# Patient Record
Sex: Female | Born: 1935 | Race: Black or African American | Hispanic: No | Smoking: Former smoker
Health system: Southern US, Community
[De-identification: ages and names within clinical notes are randomized; demographics above are authoritative.]

## PROBLEM LIST (undated history)

## (undated) DIAGNOSIS — C50919 Malignant neoplasm of unspecified site of unspecified female breast: Secondary | ICD-10-CM

## (undated) DIAGNOSIS — H409 Unspecified glaucoma: Secondary | ICD-10-CM

## (undated) DIAGNOSIS — E079 Disorder of thyroid, unspecified: Secondary | ICD-10-CM

## (undated) DIAGNOSIS — I10 Essential (primary) hypertension: Secondary | ICD-10-CM

## (undated) DIAGNOSIS — E785 Hyperlipidemia, unspecified: Secondary | ICD-10-CM

## (undated) DIAGNOSIS — C801 Malignant (primary) neoplasm, unspecified: Secondary | ICD-10-CM

## (undated) DIAGNOSIS — C3490 Malignant neoplasm of unspecified part of unspecified bronchus or lung: Secondary | ICD-10-CM

## (undated) DIAGNOSIS — D649 Anemia, unspecified: Secondary | ICD-10-CM

## (undated) DIAGNOSIS — E119 Type 2 diabetes mellitus without complications: Secondary | ICD-10-CM

---

## 1997-07-17 ENCOUNTER — Other Ambulatory Visit: Admission: RE | Admit: 1997-07-17 | Discharge: 1997-07-17 | Payer: Self-pay | Admitting: Family Medicine

## 1998-10-09 ENCOUNTER — Encounter: Payer: Self-pay | Admitting: Emergency Medicine

## 1998-10-09 ENCOUNTER — Emergency Department (HOSPITAL_COMMUNITY): Admission: EM | Admit: 1998-10-09 | Discharge: 1998-10-09 | Payer: Self-pay | Admitting: Emergency Medicine

## 1999-07-04 ENCOUNTER — Other Ambulatory Visit: Admission: RE | Admit: 1999-07-04 | Discharge: 1999-07-04 | Payer: Self-pay | Admitting: Family Medicine

## 1999-07-08 ENCOUNTER — Emergency Department (HOSPITAL_COMMUNITY): Admission: EM | Admit: 1999-07-08 | Discharge: 1999-07-08 | Payer: Self-pay | Admitting: Emergency Medicine

## 2002-03-20 ENCOUNTER — Encounter: Payer: Self-pay | Admitting: Family Medicine

## 2002-03-20 ENCOUNTER — Encounter: Admission: RE | Admit: 2002-03-20 | Discharge: 2002-03-20 | Payer: Self-pay | Admitting: Family Medicine

## 2003-06-04 ENCOUNTER — Encounter: Admission: RE | Admit: 2003-06-04 | Discharge: 2003-06-04 | Payer: Self-pay | Admitting: Family Medicine

## 2004-11-26 ENCOUNTER — Encounter: Admission: RE | Admit: 2004-11-26 | Discharge: 2004-11-26 | Payer: Self-pay | Admitting: Family Medicine

## 2004-12-09 ENCOUNTER — Encounter: Admission: RE | Admit: 2004-12-09 | Discharge: 2004-12-09 | Payer: Self-pay | Admitting: Family Medicine

## 2005-02-02 ENCOUNTER — Encounter: Admission: RE | Admit: 2005-02-02 | Discharge: 2005-02-02 | Payer: Self-pay | Admitting: Orthopedic Surgery

## 2005-07-09 ENCOUNTER — Other Ambulatory Visit: Admission: RE | Admit: 2005-07-09 | Discharge: 2005-07-09 | Payer: Self-pay | Admitting: Family Medicine

## 2005-07-09 ENCOUNTER — Encounter: Admission: RE | Admit: 2005-07-09 | Discharge: 2005-07-09 | Payer: Self-pay | Admitting: Family Medicine

## 2005-10-28 ENCOUNTER — Ambulatory Visit (HOSPITAL_COMMUNITY): Admission: RE | Admit: 2005-10-28 | Discharge: 2005-10-28 | Payer: Self-pay | Admitting: Family Medicine

## 2005-10-28 ENCOUNTER — Encounter: Payer: Self-pay | Admitting: Vascular Surgery

## 2005-11-02 ENCOUNTER — Encounter: Admission: RE | Admit: 2005-11-02 | Discharge: 2005-11-02 | Payer: Self-pay | Admitting: Orthopedic Surgery

## 2006-02-10 ENCOUNTER — Encounter: Admission: RE | Admit: 2006-02-10 | Discharge: 2006-02-10 | Payer: Self-pay | Admitting: Family Medicine

## 2006-02-19 ENCOUNTER — Encounter: Admission: RE | Admit: 2006-02-19 | Discharge: 2006-02-19 | Payer: Self-pay | Admitting: Family Medicine

## 2006-03-02 ENCOUNTER — Encounter: Admission: RE | Admit: 2006-03-02 | Discharge: 2006-03-02 | Payer: Self-pay | Admitting: Family Medicine

## 2006-03-22 ENCOUNTER — Ambulatory Visit: Payer: Self-pay | Admitting: Vascular Surgery

## 2006-04-28 ENCOUNTER — Encounter: Admission: RE | Admit: 2006-04-28 | Discharge: 2006-04-28 | Payer: Self-pay | Admitting: Endocrinology

## 2006-04-28 ENCOUNTER — Other Ambulatory Visit: Admission: RE | Admit: 2006-04-28 | Discharge: 2006-04-28 | Payer: Self-pay | Admitting: Diagnostic Radiology

## 2006-04-28 ENCOUNTER — Encounter (INDEPENDENT_AMBULATORY_CARE_PROVIDER_SITE_OTHER): Payer: Self-pay | Admitting: Specialist

## 2006-07-27 ENCOUNTER — Ambulatory Visit (HOSPITAL_COMMUNITY): Admission: RE | Admit: 2006-07-27 | Discharge: 2006-07-28 | Payer: Self-pay | Admitting: General Surgery

## 2006-07-27 ENCOUNTER — Encounter (HOSPITAL_BASED_OUTPATIENT_CLINIC_OR_DEPARTMENT_OTHER): Payer: Self-pay | Admitting: General Surgery

## 2006-10-01 ENCOUNTER — Ambulatory Visit: Payer: Self-pay | Admitting: Vascular Surgery

## 2007-04-18 ENCOUNTER — Ambulatory Visit: Payer: Self-pay | Admitting: Vascular Surgery

## 2007-10-21 ENCOUNTER — Ambulatory Visit: Payer: Self-pay | Admitting: Vascular Surgery

## 2007-11-04 ENCOUNTER — Ambulatory Visit: Payer: Self-pay | Admitting: Vascular Surgery

## 2007-12-01 ENCOUNTER — Inpatient Hospital Stay (HOSPITAL_COMMUNITY): Admission: RE | Admit: 2007-12-01 | Discharge: 2007-12-02 | Payer: Self-pay | Admitting: Vascular Surgery

## 2007-12-01 ENCOUNTER — Ambulatory Visit: Payer: Self-pay | Admitting: Vascular Surgery

## 2007-12-01 ENCOUNTER — Encounter: Payer: Self-pay | Admitting: Vascular Surgery

## 2007-12-15 ENCOUNTER — Ambulatory Visit: Payer: Self-pay | Admitting: Vascular Surgery

## 2007-12-30 ENCOUNTER — Ambulatory Visit: Payer: Self-pay | Admitting: Vascular Surgery

## 2008-03-08 ENCOUNTER — Other Ambulatory Visit: Admission: RE | Admit: 2008-03-08 | Discharge: 2008-03-08 | Payer: Self-pay | Admitting: Family Medicine

## 2008-07-27 ENCOUNTER — Ambulatory Visit: Payer: Self-pay | Admitting: Vascular Surgery

## 2008-10-09 ENCOUNTER — Encounter (INDEPENDENT_AMBULATORY_CARE_PROVIDER_SITE_OTHER): Payer: Self-pay | Admitting: General Surgery

## 2008-10-09 ENCOUNTER — Inpatient Hospital Stay (HOSPITAL_COMMUNITY): Admission: RE | Admit: 2008-10-09 | Discharge: 2008-10-18 | Payer: Self-pay | Admitting: General Surgery

## 2009-07-30 ENCOUNTER — Encounter: Admission: RE | Admit: 2009-07-30 | Discharge: 2009-07-30 | Payer: Self-pay | Admitting: General Surgery

## 2009-08-01 ENCOUNTER — Ambulatory Visit (HOSPITAL_BASED_OUTPATIENT_CLINIC_OR_DEPARTMENT_OTHER): Admission: RE | Admit: 2009-08-01 | Discharge: 2009-08-01 | Payer: Self-pay | Admitting: General Surgery

## 2009-08-16 ENCOUNTER — Ambulatory Visit (HOSPITAL_BASED_OUTPATIENT_CLINIC_OR_DEPARTMENT_OTHER): Admission: RE | Admit: 2009-08-16 | Discharge: 2009-08-16 | Payer: Self-pay | Admitting: General Surgery

## 2009-08-26 ENCOUNTER — Ambulatory Visit: Payer: Self-pay | Admitting: Oncology

## 2009-09-04 ENCOUNTER — Ambulatory Visit: Payer: Self-pay | Admitting: Oncology

## 2009-09-04 LAB — MANUAL DIFFERENTIAL (CHCC SATELLITE)
BASO: 1 % (ref 0–2)
Eos: 3 % (ref 0–7)
LYMPH: 30 % (ref 14–48)
MONO: 4 % (ref 0–13)
SEG: 61 % (ref 40–75)
Variant Lymph: 1 % — ABNORMAL HIGH (ref 0–0)

## 2009-09-04 LAB — CBC WITH DIFFERENTIAL (CANCER CENTER ONLY)
HCT: 39.9 % (ref 34.8–46.6)
HGB: 13.5 g/dL (ref 11.6–15.9)
MCH: 29.2 pg (ref 26.0–34.0)
MCV: 86 fL (ref 81–101)
Platelets: 266 10*3/uL (ref 145–400)
RBC: 4.61 10*6/uL (ref 3.70–5.32)
WBC: 8.2 10*3/uL (ref 3.9–10.0)

## 2009-09-04 LAB — CMP (CANCER CENTER ONLY)
AST: 16 U/L (ref 11–38)
Alkaline Phosphatase: 72 U/L (ref 26–84)
BUN, Bld: 11 mg/dL (ref 7–22)
Creat: 0.8 mg/dl (ref 0.6–1.2)
Glucose, Bld: 96 mg/dL (ref 73–118)
Total Bilirubin: 1.2 mg/dl (ref 0.20–1.60)

## 2009-09-06 ENCOUNTER — Inpatient Hospital Stay (HOSPITAL_COMMUNITY): Admission: EM | Admit: 2009-09-06 | Discharge: 2009-09-10 | Payer: Self-pay | Admitting: Emergency Medicine

## 2009-09-26 ENCOUNTER — Ambulatory Visit: Payer: Self-pay | Admitting: Radiation Oncology

## 2009-09-26 ENCOUNTER — Ambulatory Visit: Payer: Self-pay | Admitting: Oncology

## 2009-10-07 ENCOUNTER — Ambulatory Visit: Payer: Self-pay | Admitting: Radiation Oncology

## 2009-10-26 ENCOUNTER — Ambulatory Visit: Payer: Self-pay | Admitting: Radiation Oncology

## 2009-11-26 ENCOUNTER — Ambulatory Visit: Payer: Self-pay | Admitting: Radiation Oncology

## 2009-12-26 ENCOUNTER — Ambulatory Visit: Payer: Self-pay | Admitting: Radiation Oncology

## 2009-12-31 ENCOUNTER — Ambulatory Visit: Payer: Self-pay | Admitting: Vascular Surgery

## 2010-01-26 ENCOUNTER — Ambulatory Visit: Payer: Self-pay | Admitting: Radiation Oncology

## 2010-02-03 ENCOUNTER — Ambulatory Visit: Payer: Self-pay | Admitting: Oncology

## 2010-04-11 LAB — DIFFERENTIAL
Basophils Absolute: 0 10*3/uL (ref 0.0–0.1)
Eosinophils Absolute: 0 10*3/uL (ref 0.0–0.7)
Eosinophils Relative: 0 % (ref 0–5)
Lymphocytes Relative: 2 % — ABNORMAL LOW (ref 12–46)
Lymphs Abs: 0.3 10*3/uL — ABNORMAL LOW (ref 0.7–4.0)
Monocytes Absolute: 0.1 10*3/uL (ref 0.1–1.0)
Monocytes Absolute: 0.2 10*3/uL (ref 0.1–1.0)
Monocytes Relative: 1 % — ABNORMAL LOW (ref 3–12)
Neutro Abs: 10.7 10*3/uL — ABNORMAL HIGH (ref 1.7–7.7)
Neutrophils Relative %: 97 % — ABNORMAL HIGH (ref 43–77)
WBC Morphology: INCREASED

## 2010-04-11 LAB — GLUCOSE, CAPILLARY
Glucose-Capillary: 142 mg/dL — ABNORMAL HIGH (ref 70–99)
Glucose-Capillary: 148 mg/dL — ABNORMAL HIGH (ref 70–99)
Glucose-Capillary: 156 mg/dL — ABNORMAL HIGH (ref 70–99)
Glucose-Capillary: 158 mg/dL — ABNORMAL HIGH (ref 70–99)
Glucose-Capillary: 54 mg/dL — ABNORMAL LOW (ref 70–99)
Glucose-Capillary: 74 mg/dL (ref 70–99)
Glucose-Capillary: 75 mg/dL (ref 70–99)
Glucose-Capillary: 88 mg/dL (ref 70–99)

## 2010-04-11 LAB — BASIC METABOLIC PANEL
BUN: 19 mg/dL (ref 6–23)
BUN: 24 mg/dL — ABNORMAL HIGH (ref 6–23)
CO2: 25 mEq/L (ref 19–32)
Chloride: 104 mEq/L (ref 96–112)
Chloride: 106 mEq/L (ref 96–112)
GFR calc non Af Amer: >60 mL/min (ref >60)
Glucose, Bld: 112 mg/dL — ABNORMAL HIGH (ref 70–99)
Glucose, Bld: 199 mg/dL — ABNORMAL HIGH (ref 70–99)
Glucose, Bld: 92 mg/dL (ref 70–99)
Potassium: 3.3 mEq/L — ABNORMAL LOW (ref 3.5–5.1)
Potassium: 3.3 mEq/L — ABNORMAL LOW (ref 3.5–5.1)
Potassium: 4.5 mEq/L (ref 3.5–5.1)
Sodium: 136 mEq/L (ref 135–145)
Sodium: 137 mEq/L (ref 135–145)
Sodium: 138 mEq/L (ref 135–145)

## 2010-04-11 LAB — CBC
HCT: 31.5 % — ABNORMAL LOW (ref 36.0–46.0)
HCT: 34.4 % — ABNORMAL LOW (ref 36.0–46.0)
HCT: 38.1 % (ref 36.0–46.0)
HCT: 38.6 % (ref 36.0–46.0)
Hemoglobin: 10.5 g/dL — ABNORMAL LOW (ref 12.0–15.0)
Hemoglobin: 12.9 g/dL (ref 12.0–15.0)
Hemoglobin: 13.2 g/dL (ref 12.0–15.0)
MCH: 29.7 pg (ref 26.0–34.0)
MCH: 30 pg (ref 26.0–34.0)
MCHC: 33.3 g/dL (ref 30.0–36.0)
MCHC: 33.4 g/dL (ref 30.0–36.0)
MCV: 88.9 fL (ref 78.0–100.0)
MCV: 89.7 fL (ref 78.0–100.0)
MCV: 90 fL (ref 78.0–100.0)
RBC: 3.86 MIL/uL — ABNORMAL LOW (ref 3.87–5.11)
RBC: 4.34 MIL/uL (ref 3.87–5.11)
RDW: 15.8 % — ABNORMAL HIGH (ref 11.5–15.5)
RDW: 15.9 % — ABNORMAL HIGH (ref 11.5–15.5)
RDW: 15.9 % — ABNORMAL HIGH (ref 11.5–15.5)
WBC: 11.1 10*3/uL — ABNORMAL HIGH (ref 4.0–10.5)
WBC: 16.8 10*3/uL — ABNORMAL HIGH (ref 4.0–10.5)
WBC: 5.6 10*3/uL (ref 4.0–10.5)

## 2010-04-11 LAB — TSH: TSH: 2.851 u[IU]/mL (ref 0.350–4.500)

## 2010-04-11 LAB — URINALYSIS, ROUTINE W REFLEX MICROSCOPIC
Nitrite: NEGATIVE
Protein, ur: 100 mg/dL — AB
Protein, ur: 30 mg/dL — AB
Specific Gravity, Urine: 1.012 (ref 1.005–1.030)
Urobilinogen, UA: 0.2 mg/dL (ref 0.0–1.0)
Urobilinogen, UA: 1 mg/dL (ref 0.0–1.0)

## 2010-04-11 LAB — COMPREHENSIVE METABOLIC PANEL
ALT: 91 U/L — ABNORMAL HIGH (ref 0–35)
AST: 222 U/L — ABNORMAL HIGH (ref 0–37)
Alkaline Phosphatase: 211 U/L — ABNORMAL HIGH (ref 39–117)
Alkaline Phosphatase: 52 U/L (ref 39–117)
BUN: 24 mg/dL — ABNORMAL HIGH (ref 6–23)
CO2: 26 mEq/L (ref 19–32)
CO2: 27 mEq/L (ref 19–32)
Chloride: 100 mEq/L (ref 96–112)
GFR calc Af Amer: >60 mL/min (ref >60)
GFR calc non Af Amer: 60 mL/min — ABNORMAL LOW (ref >60)
Glucose, Bld: 125 mg/dL — ABNORMAL HIGH (ref 70–99)
Glucose, Bld: 157 mg/dL — ABNORMAL HIGH (ref 70–99)
Potassium: 3.4 mEq/L — ABNORMAL LOW (ref 3.5–5.1)
Potassium: 3.4 mEq/L — ABNORMAL LOW (ref 3.5–5.1)
Sodium: 140 mEq/L (ref 135–145)
Total Bilirubin: 0.9 mg/dL (ref 0.3–1.2)
Total Bilirubin: 2.6 mg/dL — ABNORMAL HIGH (ref 0.3–1.2)

## 2010-04-11 LAB — HEPATIC FUNCTION PANEL
ALT: 79 U/L — ABNORMAL HIGH (ref 0–35)
AST: 122 U/L — ABNORMAL HIGH (ref 0–37)
Alkaline Phosphatase: 202 U/L — ABNORMAL HIGH (ref 39–117)
Bilirubin, Direct: 0.2 mg/dL (ref 0.0–0.3)
Total Bilirubin: 0.9 mg/dL (ref 0.3–1.2)

## 2010-04-11 LAB — URINE MICROSCOPIC-ADD ON

## 2010-04-11 LAB — VITAMIN D 25 HYDROXY (VIT D DEFICIENCY, FRACTURES): Vit D, 25-Hydroxy: 40 ng/mL (ref 30–89)

## 2010-04-11 LAB — URINE CULTURE
Colony Count: NO GROWTH
Culture  Setup Time: 201108130123

## 2010-04-11 LAB — CULTURE, BLOOD (ROUTINE X 2)

## 2010-04-11 LAB — CK: Total CK: 20387 U/L — ABNORMAL HIGH (ref 7–177)

## 2010-04-12 LAB — GLUCOSE, CAPILLARY: Glucose-Capillary: 132 mg/dL — ABNORMAL HIGH (ref 70–99)

## 2010-04-12 LAB — POCT HEMOGLOBIN-HEMACUE: Hemoglobin: 14.7 g/dL (ref 12.0–15.0)

## 2010-04-13 LAB — BASIC METABOLIC PANEL
BUN: 12 mg/dL (ref 6–23)
Creatinine, Ser: 0.85 mg/dL (ref 0.4–1.2)
GFR calc Af Amer: >60 mL/min (ref >60)
GFR calc non Af Amer: >60 mL/min (ref >60)
Potassium: 3.8 mEq/L (ref 3.5–5.1)

## 2010-04-13 LAB — POCT HEMOGLOBIN-HEMACUE: Hemoglobin: 14 g/dL (ref 12.0–15.0)

## 2010-04-13 LAB — GLUCOSE, CAPILLARY: Glucose-Capillary: 137 mg/dL — ABNORMAL HIGH (ref 70–99)

## 2010-05-02 LAB — GLUCOSE, CAPILLARY
Glucose-Capillary: 100 mg/dL — ABNORMAL HIGH (ref 70–99)
Glucose-Capillary: 106 mg/dL — ABNORMAL HIGH (ref 70–99)
Glucose-Capillary: 109 mg/dL — ABNORMAL HIGH (ref 70–99)
Glucose-Capillary: 109 mg/dL — ABNORMAL HIGH (ref 70–99)
Glucose-Capillary: 113 mg/dL — ABNORMAL HIGH (ref 70–99)
Glucose-Capillary: 114 mg/dL — ABNORMAL HIGH (ref 70–99)
Glucose-Capillary: 118 mg/dL — ABNORMAL HIGH (ref 70–99)
Glucose-Capillary: 119 mg/dL — ABNORMAL HIGH (ref 70–99)
Glucose-Capillary: 121 mg/dL — ABNORMAL HIGH (ref 70–99)
Glucose-Capillary: 122 mg/dL — ABNORMAL HIGH (ref 70–99)
Glucose-Capillary: 123 mg/dL — ABNORMAL HIGH (ref 70–99)
Glucose-Capillary: 125 mg/dL — ABNORMAL HIGH (ref 70–99)
Glucose-Capillary: 127 mg/dL — ABNORMAL HIGH (ref 70–99)
Glucose-Capillary: 131 mg/dL — ABNORMAL HIGH (ref 70–99)
Glucose-Capillary: 148 mg/dL — ABNORMAL HIGH (ref 70–99)
Glucose-Capillary: 162 mg/dL — ABNORMAL HIGH (ref 70–99)
Glucose-Capillary: 97 mg/dL (ref 70–99)
Glucose-Capillary: 97 mg/dL (ref 70–99)
Glucose-Capillary: 123 mg/dL — ABNORMAL HIGH (ref 70–99)
Glucose-Capillary: 136 mg/dL — ABNORMAL HIGH (ref 70–99)
Glucose-Capillary: 141 mg/dL — ABNORMAL HIGH (ref 70–99)
Glucose-Capillary: 169 mg/dL — ABNORMAL HIGH (ref 70–99)
Glucose-Capillary: 171 mg/dL — ABNORMAL HIGH (ref 70–99)

## 2010-05-02 LAB — BASIC METABOLIC PANEL
BUN: 4 mg/dL — ABNORMAL LOW (ref 6–23)
BUN: 4 mg/dL — ABNORMAL LOW (ref 6–23)
BUN: 6 mg/dL (ref 6–23)
BUN: 9 mg/dL (ref 6–23)
CO2: 24 mEq/L (ref 19–32)
CO2: 29 mEq/L (ref 19–32)
CO2: 29 mEq/L (ref 19–32)
CO2: 30 mEq/L (ref 19–32)
CO2: 28 mEq/L (ref 19–32)
Calcium: 7.8 mg/dL — ABNORMAL LOW (ref 8.4–10.5)
Calcium: 9.5 mg/dL (ref 8.4–10.5)
Chloride: 104 mEq/L (ref 96–112)
Chloride: 108 mEq/L (ref 96–112)
Chloride: 107 mEq/L (ref 96–112)
Creatinine, Ser: 0.76 mg/dL (ref 0.4–1.2)
Creatinine, Ser: 0.9 mg/dL (ref 0.4–1.2)
GFR calc Af Amer: >60 mL/min (ref >60)
GFR calc Af Amer: >60 mL/min (ref >60)
GFR calc non Af Amer: >60 mL/min (ref >60)
Glucose, Bld: 101 mg/dL — ABNORMAL HIGH (ref 70–99)
Glucose, Bld: 126 mg/dL — ABNORMAL HIGH (ref 70–99)
Glucose, Bld: 144 mg/dL — ABNORMAL HIGH (ref 70–99)
Glucose, Bld: 168 mg/dL — ABNORMAL HIGH (ref 70–99)
Glucose, Bld: 237 mg/dL — ABNORMAL HIGH (ref 70–99)
Potassium: 3.6 mEq/L (ref 3.5–5.1)
Potassium: 3.6 mEq/L (ref 3.5–5.1)
Potassium: 3.7 mEq/L (ref 3.5–5.1)
Potassium: 3.8 mEq/L (ref 3.5–5.1)
Sodium: 140 mEq/L (ref 135–145)
Sodium: 140 mEq/L (ref 135–145)
Sodium: 141 mEq/L (ref 135–145)
Sodium: 138 mEq/L (ref 135–145)
Sodium: 139 mEq/L (ref 135–145)

## 2010-05-02 LAB — CARDIAC PANEL(CRET KIN+CKTOT+MB+TROPI)
CK, MB: 1.5 ng/mL (ref 0.3–4.0)
CK, MB: 1.8 ng/mL (ref 0.3–4.0)
CK, MB: 2.2 ng/mL (ref 0.3–4.0)
Relative Index: 0.9 (ref 0.0–2.5)
Relative Index: 1.2 (ref 0.0–2.5)
Relative Index: INVALID (ref 0.0–2.5)
Relative Index: INVALID (ref 0.0–2.5)
Total CK: 238 U/L — ABNORMAL HIGH (ref 7–177)
Troponin I: 0.06 ng/mL (ref 0.00–0.06)
Troponin I: 0.07 ng/mL — ABNORMAL HIGH (ref 0.00–0.06)
Troponin I: 0.13 ng/mL — ABNORMAL HIGH (ref 0.00–0.06)

## 2010-05-02 LAB — HEMOGLOBIN AND HEMATOCRIT, BLOOD
HCT: 27.1 % — ABNORMAL LOW (ref 36.0–46.0)
Hemoglobin: 9 g/dL — ABNORMAL LOW (ref 12.0–15.0)

## 2010-05-02 LAB — CROSSMATCH: ABO/RH(D): O POS

## 2010-05-02 LAB — CBC
HCT: 23 % — ABNORMAL LOW (ref 36.0–46.0)
HCT: 28.1 % — ABNORMAL LOW (ref 36.0–46.0)
HCT: 29 % — ABNORMAL LOW (ref 36.0–46.0)
HCT: 29.4 % — ABNORMAL LOW (ref 36.0–46.0)
Hemoglobin: 7.9 g/dL — CL (ref 12.0–15.0)
Hemoglobin: 9.6 g/dL — ABNORMAL LOW (ref 12.0–15.0)
Hemoglobin: 14.1 g/dL (ref 12.0–15.0)
Hemoglobin: 9.1 g/dL — ABNORMAL LOW (ref 12.0–15.0)
Hemoglobin: 9.6 g/dL — ABNORMAL LOW (ref 12.0–15.0)
MCHC: 33 g/dL (ref 30.0–36.0)
MCHC: 33.4 g/dL (ref 30.0–36.0)
MCHC: 34.3 g/dL (ref 30.0–36.0)
MCHC: 32.8 g/dL (ref 30.0–36.0)
MCHC: 33.1 g/dL (ref 30.0–36.0)
MCV: 92.2 fL (ref 78.0–100.0)
MCV: 93.2 fL (ref 78.0–100.0)
MCV: 93.8 fL (ref 78.0–100.0)
MCV: 93 fL (ref 78.0–100.0)
MCV: 93.4 fL (ref 78.0–100.0)
Platelets: 237 10*3/uL (ref 150–400)
Platelets: 200 10*3/uL (ref 150–400)
RBC: 3.02 MIL/uL — ABNORMAL LOW (ref 3.87–5.11)
RBC: 2.96 MIL/uL — ABNORMAL LOW (ref 3.87–5.11)
RDW: 15.2 % (ref 11.5–15.5)
RDW: 15.3 % (ref 11.5–15.5)
RDW: 14.9 % (ref 11.5–15.5)
RDW: 15 % (ref 11.5–15.5)
RDW: 15.2 % (ref 11.5–15.5)
WBC: 15 10*3/uL — ABNORMAL HIGH (ref 4.0–10.5)
WBC: 14.1 10*3/uL — ABNORMAL HIGH (ref 4.0–10.5)

## 2010-05-02 LAB — URINALYSIS, MICROSCOPIC ONLY
Glucose, UA: NEGATIVE mg/dL
Hgb urine dipstick: NEGATIVE
Specific Gravity, Urine: 1.023 (ref 1.005–1.030)
pH: 5.5 (ref 5.0–8.0)

## 2010-05-02 LAB — ABO/RH: ABO/RH(D): O POS

## 2010-05-02 LAB — URINE CULTURE: Special Requests: NEGATIVE

## 2010-05-02 LAB — POCT I-STAT 4, (NA,K, GLUC, HGB,HCT): Sodium: 140 mEq/L (ref 135–145)

## 2010-05-06 ENCOUNTER — Ambulatory Visit: Payer: Self-pay | Admitting: Internal Medicine

## 2010-05-07 LAB — CANCER ANTIGEN 27.29: CA 27.29: 18.1 U/mL (ref 0.0–38.6)

## 2010-05-27 ENCOUNTER — Ambulatory Visit: Payer: Self-pay | Admitting: Internal Medicine

## 2010-06-10 NOTE — Procedures (Signed)
CAROTID DUPLEX EXAM   INDICATION:  Followup known carotid artery disease.   HISTORY:  Diabetes:  Yes  Cardiac:  No  Hypertension:  Yes  Smoking:  Quit in 1993  Previous Surgery:  No  CV History:  No  Amaurosis Fugax No, Paresthesias No, Hemiparesis No                                       RIGHT             LEFT  Brachial systolic pressure:         150               150  Brachial Doppler waveforms:         Biphasic          Biphasic  Vertebral direction of flow:        Antegrade         Antegrade  DUPLEX VELOCITIES (cm/sec)  CCA peak systolic                   89                86  ECA peak systolic                   214               291  ICA peak systolic                   284               167  ICA end diastolic                   86                40  PLAQUE MORPHOLOGY:                  Calcified         Calcified  PLAQUE AMOUNT:                      Moderate          Moderate  PLAQUE LOCATION:                    ICA, ECA          ICA, ECA,   IMPRESSION:  A 60-79% stenosis noted in the right internal carotid  artery.  A 40-59% stenosis noted in the left internal carotid artery.  Antegrade bilateral vertebral arteries.   ___________________________________________  Larina Earthly, M.D.   MG/MEDQ  D:  10/01/2006  T:  10/01/2006  Job:  [id number]

## 2010-06-10 NOTE — Procedures (Signed)
CAROTID DUPLEX EXAM   INDICATION:  Followup known carotid artery disease and right carotid  endarterectomy.   HISTORY:  Diabetes:  Yes.  Cardiac:  No.  Hypertension:  Yes.  Smoking:  Quit in 1993.  Previous Surgery:  Right carotid endarterectomy.  CV History:  No.  Amaurosis Fugax No, Paresthesias No, Hemiparesis No                                       RIGHT             LEFT  Brachial systolic pressure:         130               118  Brachial Doppler waveforms:         Biphasic          Biphasic  Vertebral direction of flow:        Antegrade         Antegrade  DUPLEX VELOCITIES (cm/sec)  CCA peak systolic                   84                72  ECA peak systolic                   82                161  ICA peak systolic                   88                173  ICA end diastolic                   25                54  PLAQUE MORPHOLOGY:                  None              Calcified  PLAQUE AMOUNT:                      None              Moderate  PLAQUE LOCATION:                    None              ICA and ECA   IMPRESSION:  1. 40-59% stenosis noted in the left ICA.  2. Normal carotid duplex noted in the right ICA.  Status post right      carotid endarterectomy.  3. Antegrade bilateral vertebral arteries.   ___________________________________________  Larina Earthly, M.D.   MG/MEDQ  D:  07/27/2008  T:  07/27/2008  Job:  [id number]

## 2010-06-10 NOTE — Op Note (Signed)
NAME:  Madison Fry, Madison Fry NO.:  [id number]   MEDICAL RECORD NO.:  [id number]          PATIENT TYPE:  OIB   LOCATION:  1529                         FACILITY:  [facility]   PHYSICIAN:  Leonie Man, M.D.   DATE OF BIRTH:  [birth year]   DATE OF PROCEDURE:  07/27/2006  DATE OF DISCHARGE:                               OPERATIVE REPORT   PREOPERATIVE DIAGNOSIS:  Follicular nodules left thyroid lobe, rule out  carcinoma.   POSTOPERATIVE DIAGNOSIS:  Follicular nodules left thyroid lobe, rule out  carcinoma. Pathology pending.   PROCEDURE:  Left thyroid lobectomy.   SURGEON:  Dr. Leonie Man.   ASSISTANT:  Dr. Chevis Pretty.   ANESTHESIA:  General.   SPECIMENS TO LAB:  Left thyroid lobe.   ESTIMATED BLOOD LOSS:  Minimal.   COMPLICATIONS:  None.  The patient returned to the PACU in excellent  condition.   Madison Fry is a 77 year old lady who was discovered to have  thyroid nodules while getting carotid Dopplers. These were biopsied and  noted to be follicular lesions.  The patient come has come to the  operating room now for left-sided thyroid lobectomy to rule out the  possibility of a follicular carcinoma.  She understands the risks and  potential benefits of surgery and gives her consent to same.   DESCRIPTION OF PROCEDURE:  The patient was positioned supinely, the head  and neck hyperextending following the induction of satisfactory general  anesthesia.  The neck was prepped and draped to be included in a sterile  operative field.  Positive identification of the patient is made and  positive identification that the left lobe is to be removed.  A  transverse collar incision is made deep and through the skin and  subcutaneous tissue and through the platysma muscle. The superior flap  raised up to the thyroid cartilage and inferior flap down to the sternal  notch.  Strap muscles opened in the midline and dissection carried over  onto the left side, isolating  and mobilizing the left thyroid gland.  The gland then noted to have 2 nodules within it.  Dissection was taken  up to the upper pole where the upper pole itself was isolated, secured  with ties of 2-0 silk and a clip and the upper pole transected with the  harmonic scalpel.  Dissection was carried down to the lower pole and  starting at the lower pole dissection was carried up avoiding the  recurrent laryngeal nerve and the parathyroid glands.  Taking the  thyroid down serially maintaining hemostasis with clips and with  electrocautery. The thyroid was dissected free from the trachea and  carried across the midline of the trachea taking a portion of the  isthmus of the thyroid with it. The gland was then transected using the  harmonic scalpel, forwarded for pathologic evaluation.  Frozen section  was inconclusive with respect to cancer.  Permanent sections are  awaiting patholigical review.  Sponge, instrument and sharp counts were  verified, hemostasis assured within the left side of the neck. After a  thorough irrigation, patches of Surgicel were placed  within the neck for  additional hemostasis.  The midline strap muscles were then closed with  interrupted 3-0  Vicryl sutures.  Platysma muscle closed with interrupted 3-0 Vicryl  sutures.  Skin closed with running 5-0 Monocryl suture and reinforced  with Steri-Strips.  Sterile dressings applied.  The anesthetic reversed.  The patient removed from the operating room to the recovery room in  stable condition.  She tolerated the procedure well.      Leonie Man, M.D.  Electronically Signed     PB/MEDQ  D:  07/27/2006  T:  07/27/2006  Job:  [id number]   cc:   Dorisann Frames, M.D.  Fax: [phone]   Donia Guiles, M.D.  Fax: [phone]

## 2010-06-10 NOTE — Assessment & Plan Note (Signed)
OFFICE VISIT   Madison Fry, Madison Fry  DOB:  1928/07/27                                       10/01/2006  CHART#:[id number]   FOLLOWUP:  The patient comes in for continuing follow up of her  extracranial cerebrovascular occlusive disease.  She remains  asymptomatic.  She does have recent thyroid resection.  She reported she  had abnormal cells but does not recall being told this was cancerous.  She specifically denies any cardiac difficulty and specifically denies  any new neurologic deficits.   PHYSICAL EXAMINATION:  On physical exam, she is a well-developed, well-  nourished female appearing stated age of 58.  Blood pressure is 142/67,  pulse 69, respirations 18.  Her radial pulses are 2+ bilaterally.  She  is grossly intact neurologically.  I do not hear carotid bruits  bilaterally.   She underwent repeat carotid duplex in our office and this does show  progressing of her stenosis.  On her right carotid system, she is in the  60-79% range.  On the left, she is in the 40-59% range.   RECOMMENDATIONS:  I discussed this at length with the patient and  explained symptoms of carotid disease with her.  She will notify us  immediately if this occurs.  Otherwise we will see her again in 6 months  with serial carotid duplex to rule out progression to severe stenosis.   Larina Earthly, M.D.  Electronically Signed   TFE/MEDQ  D:  10/01/2006  T:  10/04/2006  Job:  [id number]   cc:   Donia Guiles, M.D.

## 2010-06-10 NOTE — Assessment & Plan Note (Signed)
OFFICE VISIT   LYNDSI, ALTIC  DOB:  [birth year]                                       11/04/2007  CHART#:[id number]   The patient presents today for evaluation of continued discussion of her  asymptomatic severe carotid stenosis.  I had seen her in routine  followup on 10/21/2007 and she had a marked progression of her stenosis.  She remains asymptomatic.  She has requested that we discuss this with  her daughter as well for better understanding.  I again explained the  asymptomatic nature of her severe right internal carotid artery  stenosis.  I explained that with her greater than 80% stenosis, that it  did put her at a statistically significant increase in right hemispheric  stroke related to her high-grade stenosis.  I again explained the  procedure to include endarterectomy with anticipated overnight stay in  the hospital and the usual recovery with some peri-incisional numbness.  I discussed the potential risks of surgery to include the 1 to 2% risk  of stroke with surgery.  The patient and her daughter understand and  wish to proceed with surgery.  The patient's daughter will be out of  town in late October and requests that this be done after this.  She is  scheduled for surgery at [facility] on 11/05 for a right  carotid endarterectomy for asymptomatic stenosis.   Larina Earthly, M.D.  Electronically Signed   TFE/MEDQ  D:  11/04/2007  T:  11/06/2007  Job:  [id number]

## 2010-06-10 NOTE — Op Note (Signed)
Madison Fry, Madison Fry NO.:  [id number]   MEDICAL RECORD NO.:  [id number]          PATIENT TYPE:  INP   LOCATION:  2899                         FACILITY:  [facility]   PHYSICIAN:  Larina Earthly, M.D.    DATE OF BIRTH:  [birth year]   DATE OF PROCEDURE:  12/01/2007  DATE OF DISCHARGE:                               OPERATIVE REPORT   PREOPERATIVE DIAGNOSIS:  Severe asymptomatic right internal carotid  artery stenosis.   POSTOPERATIVE DIAGNOSIS:  Severe asymptomatic right internal carotid  artery stenosis.   PROCEDURE:  Right carotid endarterectomy and Dacron patch angioplasty.   SURGEON:  Larina Earthly, MD   ASSISTANT:  Jerold Coombe, PA-C   ANESTHESIA:  General endotracheal.   COMPLICATIONS:  None.   DISPOSITION:  To recovery room neurologically intact.   PROCEDURE IN DETAIL:  The patient was taken to the operating room,  placed in supine position where the right neck prepped and draped in the  usual sterile fashion.  Incision was made at the anterior  sternocleidomastoid and carried down through the platysma with  electrocautery.  Sternocleidomastoid reflected posteriorly and the  carotid sheath was opened.  The patient had several small branches of  facial vein and these were ligated with 3-0 silk ties and divided.  The  common carotid artery was circled with an umbilical tape Rumel  tourniquet.  Dissection was taken down to the bifurcation.  The superior  thyroid artery was circled with 2-0 silk Potts tie, the external carotid  with a blue vessel loop, and the internal carotid was encircled with an  umbilical tape and Rumel tourniquet.  The plaque did extend further  distally on the internal carotid artery and for this reason this was  further mobilized.  The hypoglossal and vagus nerves were identified and  preserved.  The patient was given 9000 units of intravenous heparin and  after adequate circulation time, the internal and external common  carotid  arteries were occluded.  The common carotid artery was opened  with a 11 blade and extended longitudinally using Potts scissors through  the plaque onto the internal carotid artery with Potts scissors.  The  arteriotomy was extended above the level of the plaque on the internal  carotid artery.  A 10 short shunt was passed up the internal carotid  artery, allowed the back bleed, and then down the common carotid artery  where it was secured with Rumel tourniquets.  The endarterectomy was  begun on the common carotid artery and the plaque was divided proximally  with Potts scissors.  The endarterectomy was continued onto the  bifurcation.  The external carotid was endarterectomized by eversion  technique and the internal carotid artery was endarterectomized in an  open fashion.  Remaining atheromatous debris was removed from the  endarterectomy plane.  Finesse Hemashield Dacron patch was brought onto  the field and was sewn as patch angioplasty with a running 6-0 Prolene  suture.  Prior to completion of the anastomosis, shunt was removed and  usual flush maneuvers were undertaken.  The anastomosis was completed  and flow restored first  to the external and internal carotid artery.  Excellent flow characteristics were noted with hand-held Doppler in the  internal and external  carotid arteries.  The patient was given 50 mg of protamine to reverse  the heparin.  Wounds were irrigated with saline.  Hemostasis obtained  with electrocautery.  Wounds were closed with 3-0 Vicryl  in the  subcutaneous and subcuticular tissues.  Benzoin and Steri-Strips were  applied.      Larina Earthly, M.D.  Electronically Signed     TFE/MEDQ  D:  12/01/2007  T:  12/01/2007  Job:  [id number]

## 2010-06-10 NOTE — Procedures (Signed)
CAROTID DUPLEX EXAM   INDICATION:  Followup known carotid artery disease.   HISTORY:  Diabetes:  Yes.  Cardiac:  No.  Hypertension:  Yes.  Smoking:  Quit in 1993.  Previous Surgery:  No.  CV History:  Amaurosis Fugax No, Paresthesias No, Hemiparesis No                                       RIGHT             LEFT  Brachial systolic pressure:         130               130  Brachial Doppler waveforms:         Biphasic          Biphasic  Vertebral direction of flow:        Antegrade         Antegrade  DUPLEX VELOCITIES (cm/sec)  CCA peak systolic                   104               107  ECA peak systolic                   255               221  ICA peak systolic                   413               195  ICA end diastolic                   124               48  PLAQUE MORPHOLOGY:                  Calcified         Calcified  PLAQUE AMOUNT:                      Severe            Moderate  PLAQUE LOCATION:                    ICA and ECA       ICA and ECA   IMPRESSION:  1. 80-99% stenosis with heavy calcified plaque noted in the right ICA.  2. 40-59% stenosis noted in the left ICA.  Antegrade bilateral      vertebral arteries.      ___________________________________________  Larina Earthly, M.D.   MG/MEDQ  D:  10/21/2007  T:  10/22/2007  Job:  [id number]

## 2010-06-10 NOTE — Procedures (Signed)
CAROTID DUPLEX EXAM   INDICATION:  Follow up CEA.   HISTORY:  Diabetes:  Yes.  Cardiac:  No.  Hypertension:  Yes.  Smoking:  Quit in 1993.  Previous Surgery:  Right CEA 11/09 by Dr. Arbie Cookey.  CV History:  No.  Amaurosis Fugax No, Paresthesias No, Hemiparesis No.                                       RIGHT             LEFT  Brachial systolic pressure:         138               144  Brachial Doppler waveforms:         WNL               WNL  Vertebral direction of flow:        Antegrade         Antegrade  DUPLEX VELOCITIES (cm/sec)  CCA peak systolic                   98                89  ECA peak systolic                   101               206  ICA peak systolic                   75                178  ICA end diastolic                   18                37  PLAQUE MORPHOLOGY:                  N/A               Mixed  PLAQUE AMOUNT:                      Minimal           Moderate  PLAQUE LOCATION:                    N/A               CCA/ICA/ECA   IMPRESSION:  1. Widely patent right carotid endarterectomy.  2. 40% to 59% left internal carotid artery.  3. Left internal carotid artery stenosis.  4. Antegrade vertebral arteries bilaterally.  5. Stable findings compared to previous study of 07/2008.   ___________________________________________  Larina Earthly, M.D.   LT/MEDQ  D:  12/31/2009  T:  12/31/2009  Job:  [id number]

## 2010-06-10 NOTE — Discharge Summary (Signed)
Madison Fry, Madison Fry NO.:  [id number]   MEDICAL RECORD NO.:  [id number]          PATIENT TYPE:  INP   LOCATION:  3303                         FACILITY:  [facility]   PHYSICIAN:  Larina Earthly, M.D.    DATE OF BIRTH:  August 09, 1928   DATE OF ADMISSION:  12/01/2007  DATE OF DISCHARGE:  12/02/2007                               DISCHARGE SUMMARY   ADMISSION DIAGNOSIS:  Severe asymptomatic right internal carotid artery  stenosis.   FINAL DISCHARGE DIAGNOSES AND SECONDARY DIAGNOSES:  1. Severe asymptomatic right internal carotid artery stenosis status      post right carotid endarterectomy.  2. Diabetes mellitus type 2.  3. Hypertension.  4. Tobacco use, but quit in 1993.  5. Osteoarthritis.  6. Hyperlipidemia.  7. History of umbilical hernia repair in 1970.  8. History of appendectomy.  9. Thyroidectomy in 2008.  10.Occluded right fallopian tube.  11.Gastroesophageal reflux disease.  12.Mild postoperative hypokalemia, supplemented.  13.Mild postoperative hypotension.   PROCEDURES:  On December 01, 2007, right carotid endarterectomy with  Dacron patch angioplasty by Dr. Gretta Began.   BRIEF HISTORY:  Ms. Husband is a 77 year old female who has been  followed by Dr. Arbie Cookey for asymptomatic severe carotid stenosis.  She was  recently noted to have greater than 80% stenosis on the right.  Therefore, he recommended she undergo elective right carotid  endarterectomy to reduce her risk for future stroke.  She has 40-59%  stenosis on the left.  She has antegrade bilateral vertebral artery  flow.   HOSPITAL COURSE:  Ms. Stoney was electively admitted to [facility] on December 01, 2007.  She underwent the previously mentioned  procedure.  At the time of this dictation, she has had a relatively  uneventful postoperative course.  Of note, her urinalysis was mildly  positive, so a order for culture sensitivity has been ordered but is  still pending at this time.  She  has been afebrile, however, and has a  normal white count.  Postoperatively, she was extubated neurologically  intact.  She did show some evidence of mild right marginal mandibular  palsy, but denied dysphagia.  Neurologically, she has remained intact  and after recovery was transferred to Step-down Unit 3300 where in  anticipation will remain until discharge.  She had some mild  postoperative bradycardia in the low 50s, but has primarily been  maintaining normal sinus rhythm.  Her systolic blood pressure  postoperatively has been around 90 up to 130, it was around 105 on the  morning of postoperative day #1 and subsequently her antihypertensives  were held that morning only.  Her blood sugars have been stable on  glimepiride and NovoLog insulin sliding scale that was used during her  hospitalization.  At this time, she has voided and tolerated a regular  diet.  Her neck incision is healing well without signs of hematoma.  Her  pain has been controlled.  She will ambulate with the nursing staff  later this morning and if does well, we will plan to discharge home on  postoperative day #1.  Currently, vitals  were stable.  She is afebrile.  Her morning labs show a white count of 9.7, hemoglobin of 10.9,  hematocrit of 32.3, and platelet count of 172.  Sodium 134, potassium  3.3 which was supplemented, BUN of 10, creatinine 0.89, and blood  glucose of 134.  Currently, she remains in stable condition and felt  appropriate for discharge home if ambulating adequately.  She lives  alone, but family will be staying with her for the next several days.   DISCHARGE MEDICATIONS:  1. Oxycodone 5 mg 1-2 tablets p.o. q.4 h. p.r.n. pain or she may take      her Darvocet-N 100 as prescribed which she has at home.  However,      she was instructed not to take these within 6 hours of each other.  2. Atenolol HCT 50/25 mg q.p.m.  3. Benicar 40 mg at 4 p.m.  4. Glimepiride per her home dose at 1 p.m.  5.  Crestor 10 mg at 4 p.m.  6. Vitamin D supplement at 4 p.m.  7. Vitamin B12 supplement at 4 p.m.  8. Omega-3 supplements twice weekly.  9. Coated aspirin 325 mg daily.   DISCHARGE INSTRUCTIONS:  She is to continue her daily walking and  breathing exercises.  Avoid driving or heavy lifting for the next couple  of weeks.  She may shower and clean her incisions gently with soap and  water.  Call if she develops fever greater than 101, redness or drainage  from the incision site or neurologic changes.  Otherwise, she will see  Dr. Arbie Cookey in 2-3 weeks.  Our office will contact her regarding specific  appointment date and time.      Jerold Coombe, P.A.      Larina Earthly, M.D.  Electronically Signed    AWZ/MEDQ  D:  12/02/2007  T:  12/02/2007  Job:  [id number]   cc:   Leonie Man, M.D.  Donia Guiles, M.D.

## 2010-06-10 NOTE — Assessment & Plan Note (Signed)
OFFICE VISIT   Madison Fry, Madison Fry  DOB:  07-08-1928                                       10/21/2007  CHART#:[id number]   The patient presents today for followup of her asymptomatic carotid  disease.  I had seen her 1 year ago with moderate bilateral carotid  stenosis.  She has had no neurologic deficits, specifically no amaurosis  fugax, transient ischemic attack, or stroke.  She was seen today for  duplex followup.   PAST HISTORY:  Significant for non-insulin-dependent diabetes,  hypertension.  She does not have any history of cardiac disease.   SOCIAL HISTORY:  She does not smoke cigarettes, having quit in 1993.  Does not drink alcohol on a regular basis.   REVIEW OF SYSTEMS:  Positive for severe arthritis with pain in her legs  with walking.  She does have a history of dizziness in the past.  She is  on daily aspirin therapy.   PHYSICAL EXAM:  Well-developed black female appearing stated age of 30.  She is grossly intact neurologically.  Her radial pulses are 2+  bilaterally.  I cannot palpate pedal pulses.  Her carotid arteries are  without bruits bilaterally.  She has normal carotid pulses bilaterally.  She is grossly intact neurologically.  Heart is regular rate and rhythm.  Chest is clear bilaterally.   On carotid duplex today, she has had marked progression to severe  stenosis in her right internal carotid artery.  The location of the  bifurcation is normal and she does have normal internal carotid artery  above the severe stenosis.  I discussed this at length with the patient.  I explained that this increases her risk for stroke and I have  recommended that she proceed with right carotid endarterectomy for  reduction of her stroke risk.  I explained the procedure to include 1 to  2% risk of stroke with surgery and also low risk for cranial nerve  injury.  She understands.  She does wish to discuss this further with  her daughter for making  plans for surgery.  I explained that I would be  willing to see her back in the office with her daughter for further  discussion, or we could discuss this at the hospital at the time of her  surgery if she wishes to proceed.  She will notify us of her decision.   Larina Earthly, M.D.  Electronically Signed   TFE/MEDQ  D:  10/21/2007  T:  10/24/2007  Job:  [id number]   cc:   Donia Guiles, M.D.

## 2010-06-10 NOTE — Assessment & Plan Note (Signed)
OFFICE VISIT   MATISSE, ROSKELLEY  DOB:  14-Mar-1928                                       12/30/2007  CHART#:[id number]   The patient presents today for follow-up of her right carotid  endarterectomy and Dacron patch angioplasty on 12/01/2007.  She had a  severe asymptomatic right internal carotid artery stenosis.  She did  quite well in the hospital and was discharged to home on postoperative  day 1 with no postoperative difficulties.  She reports the usual amount  of peri-incisional numbness.  She reports that the soreness was present  for several days and then resolved completely.  She has had no  neurologic deficits.  She does report some fluctuation in her blood  pressure, reports that this has been running a little lower than usual  and she has actually held her antihypertensive medications on occasion.  She is meeting with Dr. Arvilla Market in February and will discuss this  further with him.   PHYSICAL EXAMINATION:  Her right neck incision is well-healed.  She has  no bruits bilaterally.  She is grossly intact neurologically.   I am quite pleased with her initial result.  I plan to see her again in  6 months with repeat carotid duplex at that time.  She will notify us  should she develop any wound problems or neurologic deficits.   Larina Earthly, M.D.  Electronically Signed   TFE/MEDQ  D:  12/30/2007  T:  01/02/2008  Job:  [id number]   cc:   Donia Guiles, M.D.

## 2010-06-10 NOTE — Procedures (Signed)
CAROTID DUPLEX EXAM   INDICATION:  Followup, carotid artery disease.   HISTORY:  Diabetes:  Yes.  Cardiac:  No.  Hypertension:  Yes.  Smoking:  Quit in 1993.  Previous Surgery:  No.  CV History:  Amaurosis Fugax No, Paresthesias No, Hemiparesis No                                       RIGHT             LEFT  Brachial systolic pressure:         134               140  Brachial Doppler waveforms:         Biphasic          Biphasic  Vertebral direction of flow:        Antegrade         Antegrade  DUPLEX VELOCITIES (cm/sec)  CCA peak systolic                   92                93  ECA peak systolic                   276               288  ICA peak systolic                   279               151  ICA end diastolic                   75                29  PLAQUE MORPHOLOGY:                  Calcified with shadowing            Calcified with shadowing  PLAQUE AMOUNT:                      Moderate to large Moderate to large  PLAQUE LOCATION:                    ICA, ECA          ICA, ECA   IMPRESSION:  1. Bilateral external carotid artery stenoses.  2. 60-79% right internal carotid artery stenosis.  3. 40-59% left internal carotid artery stenosis (lower end of range).  4. Calcified plaque with shadowing bilaterally could obscure a more      severe stenosis.   ___________________________________________  Larina Earthly, M.D.   DP/MEDQ  D:  04/18/2007  T:  04/18/2007  Job:  [id number]

## 2010-06-10 NOTE — Assessment & Plan Note (Signed)
OFFICE VISIT   Madison Fry, Madison Fry  DOB:  01-25-1929                                       07/27/2008  ZOXWR#:[id number]   The patient presents today for followup of her right carotid  endarterectomy for severe asymptomatic right internal carotid artery  stenosis on 12/01/2007.  She has done well since her surgery and has no  neurologic deficits.  She reports that she has had several colon polyps  and is going to require partial colon resection by Dr. Violeta Gelinas.  She is being seen for preoperative evaluation by this.  Her health is  otherwise unchanged.  Her right neck incision is well-healed.  She does  not have bruits bilaterally.  She is grossly intact neurologically.  She  has 2+ radial pulses bilaterally.  She underwent a carotid duplex today  in our office and this reveals wide patency of her endarterectomy.  She  does have no change in her moderate 40-59% stenosis in the left carotid.  She will continue her usual activity.  I explained that there is no  limitation or contraindication from the standpoint of her carotid  disease for her upcoming abdominal surgery as indicated.  We will  continue to follow her in our office per protocol.   Larina Earthly, M.D.  Electronically Signed   TFE/MEDQ  D:  07/27/2008  T:  07/31/2008  Job:  [id number]   cc:   Donia Guiles, M.D.  Gabrielle Dare Janee Morn, M.D.

## 2010-10-28 LAB — URINALYSIS, ROUTINE W REFLEX MICROSCOPIC
Bilirubin Urine: NEGATIVE
Bilirubin Urine: NEGATIVE
Glucose, UA: NEGATIVE
Hgb urine dipstick: NEGATIVE
Ketones, ur: NEGATIVE
Ketones, ur: NEGATIVE
Nitrite: NEGATIVE
Protein, ur: NEGATIVE
Specific Gravity, Urine: 1.023
Urobilinogen, UA: 1
pH: 5.5

## 2010-10-28 LAB — CBC
HCT: 32.3 — ABNORMAL LOW
HCT: 41.6
Hemoglobin: 10.9 — ABNORMAL LOW
MCHC: 33.1
MCHC: 33.8
MCV: 92.1
Platelets: 172
Platelets: 225
RDW: 14.2
RDW: 14.5

## 2010-10-28 LAB — COMPREHENSIVE METABOLIC PANEL
Albumin: 3.9
Alkaline Phosphatase: 76
BUN: 15
Chloride: 104
Creatinine, Ser: 0.87
Glucose, Bld: 103 — ABNORMAL HIGH
Potassium: 3.8
Total Bilirubin: 1.5 — ABNORMAL HIGH

## 2010-10-28 LAB — POCT I-STAT 7, (LYTES, BLD GAS, ICA,H+H)
Acid-Base Excess: 3 — ABNORMAL HIGH
Bicarbonate: 25.9 — ABNORMAL HIGH
Calcium, Ion: 1.15
O2 Saturation: 100
Patient temperature: 36.2
TCO2: 27
pCO2 arterial: 33.7 — ABNORMAL LOW
pO2, Arterial: 296 — ABNORMAL HIGH

## 2010-10-28 LAB — POCT I-STAT 4, (NA,K, GLUC, HGB,HCT)
HCT: 36
Sodium: 137

## 2010-10-28 LAB — GLUCOSE, CAPILLARY
Glucose-Capillary: 100 — ABNORMAL HIGH
Glucose-Capillary: 105 — ABNORMAL HIGH
Glucose-Capillary: 182 — ABNORMAL HIGH

## 2010-10-28 LAB — BASIC METABOLIC PANEL
BUN: 10
CO2: 28
Calcium: 8.4
Glucose, Bld: 134 — ABNORMAL HIGH
Sodium: 134 — ABNORMAL LOW

## 2010-10-28 LAB — TYPE AND SCREEN
ABO/RH(D): O POS
Antibody Screen: NEGATIVE

## 2010-10-28 LAB — PROTIME-INR
INR: 1
Prothrombin Time: 13

## 2010-10-28 LAB — APTT: aPTT: 30

## 2010-10-28 LAB — URINE MICROSCOPIC-ADD ON

## 2010-10-28 LAB — ABO/RH: ABO/RH(D): O POS

## 2010-11-05 ENCOUNTER — Ambulatory Visit: Payer: Self-pay | Admitting: Internal Medicine

## 2010-11-11 LAB — CALCIUM: Calcium: 8.6

## 2010-11-12 LAB — COMPREHENSIVE METABOLIC PANEL
ALT: 14
CO2: 26
Calcium: 9.4
GFR calc non Af Amer: >60
Glucose, Bld: 94
Sodium: 140

## 2010-11-12 LAB — URINALYSIS, ROUTINE W REFLEX MICROSCOPIC
Bilirubin Urine: NEGATIVE
Ketones, ur: NEGATIVE
Nitrite: NEGATIVE
Protein, ur: NEGATIVE
Urobilinogen, UA: 1

## 2010-11-12 LAB — CBC
Hemoglobin: 14
MCHC: 34.3
MCV: 88.6
RBC: 4.6

## 2010-11-12 LAB — DIFFERENTIAL
Basophils Absolute: 0
Basophils Relative: 0
Eosinophils Absolute: 0.2
Lymphs Abs: 2.3
Neutrophils Relative %: 69

## 2010-11-12 LAB — PROTIME-INR
INR: 0.9
Prothrombin Time: 12.7

## 2010-11-12 LAB — URINE MICROSCOPIC-ADD ON

## 2010-11-27 ENCOUNTER — Ambulatory Visit: Payer: Self-pay | Admitting: Internal Medicine

## 2011-05-07 ENCOUNTER — Ambulatory Visit: Payer: Self-pay | Admitting: Oncology

## 2011-05-07 LAB — COMPREHENSIVE METABOLIC PANEL
Albumin: 3.6 g/dL (ref 3.4–5.0)
Anion Gap: 10 (ref 7–16)
BUN: 16 mg/dL (ref 7–18)
Bilirubin,Total: 0.5 mg/dL (ref 0.2–1.0)
Calcium, Total: 9.3 mg/dL (ref 8.5–10.1)
Co2: 28 mmol/L (ref 21–32)
Creatinine: 1.03 mg/dL (ref 0.60–1.30)
Osmolality: 286 (ref 275–301)
Sodium: 141 mmol/L (ref 136–145)
Total Protein: 7.5 g/dL (ref 6.4–8.2)

## 2011-05-07 LAB — CBC CANCER CENTER
Basophil %: 0.4 %
HCT: 38.9 % (ref 35.0–47.0)
HGB: 13.2 g/dL (ref 12.0–16.0)
MCHC: 34 g/dL (ref 32.0–36.0)
Monocyte #: 0.5 x10 3/mm (ref 0.2–0.9)
Neutrophil #: 6 x10 3/mm (ref 1.4–6.5)
Neutrophil %: 75 %
Platelet: 255 x10 3/mm (ref 150–440)
RBC: 4.11 10*6/uL (ref 3.80–5.20)

## 2011-05-27 ENCOUNTER — Ambulatory Visit: Payer: Self-pay | Admitting: Oncology

## 2011-08-12 ENCOUNTER — Encounter: Payer: Self-pay | Admitting: Family Medicine

## 2011-08-27 ENCOUNTER — Encounter: Payer: Self-pay | Admitting: Family Medicine

## 2011-11-05 ENCOUNTER — Ambulatory Visit: Payer: Self-pay | Admitting: Oncology

## 2011-11-27 ENCOUNTER — Ambulatory Visit: Payer: Self-pay | Admitting: Oncology

## 2012-05-16 ENCOUNTER — Ambulatory Visit: Payer: Self-pay | Admitting: Oncology

## 2014-02-06 ENCOUNTER — Ambulatory Visit: Payer: Self-pay | Admitting: Oncology

## 2014-02-20 ENCOUNTER — Ambulatory Visit: Payer: Self-pay | Admitting: Oncology

## 2014-02-26 ENCOUNTER — Ambulatory Visit: Payer: Self-pay | Admitting: Oncology

## 2014-06-28 ENCOUNTER — Emergency Department
Admission: EM | Admit: 2014-06-28 | Discharge: 2014-06-28 | Disposition: A | Discharge status: Home / Self Care | Payer: Commercial Managed Care - HMO | Attending: Emergency Medicine | Admitting: Emergency Medicine

## 2014-06-28 ENCOUNTER — Emergency Department: Payer: Commercial Managed Care - HMO

## 2014-06-28 ENCOUNTER — Encounter: Payer: Self-pay | Admitting: Emergency Medicine

## 2014-06-28 DIAGNOSIS — M545 Low back pain: Secondary | ICD-10-CM | POA: Diagnosis present

## 2014-06-28 DIAGNOSIS — M5432 Sciatica, left side: Secondary | ICD-10-CM | POA: Diagnosis not present

## 2014-06-28 HISTORY — DX: Type 2 diabetes mellitus without complications: E11.9

## 2014-06-28 HISTORY — DX: Essential (primary) hypertension: I10

## 2014-06-28 LAB — URINALYSIS COMPLETE WITH MICROSCOPIC (ARMC ONLY)
Bacteria, UA: NONE SEEN
Bilirubin Urine: NEGATIVE
GLUCOSE, UA: NEGATIVE mg/dL
HGB URINE DIPSTICK: NEGATIVE
KETONES UR: NEGATIVE mg/dL
NITRITE: NEGATIVE
PH: 5 (ref 5.0–8.0)
PROTEIN: NEGATIVE mg/dL
Specific Gravity, Urine: 1.014 (ref 1.005–1.030)

## 2014-06-28 LAB — COMPREHENSIVE METABOLIC PANEL
ALK PHOS: 49 U/L (ref 38–126)
ALT: 9 U/L — ABNORMAL LOW (ref 14–54)
AST: 20 U/L (ref 15–41)
Albumin: 3.5 g/dL (ref 3.5–5.0)
Anion gap: 10 (ref 5–15)
BUN: 15 mg/dL (ref 6–20)
CO2: 27 mmol/L (ref 22–32)
Calcium: 9 mg/dL (ref 8.9–10.3)
Chloride: 100 mmol/L — ABNORMAL LOW (ref 101–111)
Creatinine, Ser: 0.86 mg/dL (ref 0.44–1.00)
Glucose, Bld: 172 mg/dL — ABNORMAL HIGH (ref 65–99)
POTASSIUM: 3.3 mmol/L — AB (ref 3.5–5.1)
Sodium: 137 mmol/L (ref 135–145)
Total Bilirubin: 0.6 mg/dL (ref 0.3–1.2)
Total Protein: 6.7 g/dL (ref 6.5–8.1)

## 2014-06-28 LAB — CBC WITH DIFFERENTIAL/PLATELET
Basophils Absolute: 0.1 10*3/uL (ref 0–0.1)
Basophils Relative: 1 %
Eosinophils Absolute: 0.1 10*3/uL (ref 0–0.7)
Eosinophils Relative: 1 %
HEMATOCRIT: 39.6 % (ref 35.0–47.0)
HEMOGLOBIN: 13.1 g/dL (ref 12.0–16.0)
Lymphocytes Relative: 16 %
Lymphs Abs: 1.3 10*3/uL (ref 1.0–3.6)
MCH: 31.5 pg (ref 26.0–34.0)
MCHC: 33.2 g/dL (ref 32.0–36.0)
MCV: 94.9 fL (ref 80.0–100.0)
Monocytes Absolute: 0.4 10*3/uL (ref 0.2–0.9)
Monocytes Relative: 5 %
NEUTROS PCT: 77 %
Neutro Abs: 6.4 10*3/uL (ref 1.4–6.5)
PLATELETS: 214 10*3/uL (ref 150–440)
RBC: 4.17 MIL/uL (ref 3.80–5.20)
RDW: 14.1 % (ref 11.5–14.5)
WBC: 8.3 10*3/uL (ref 3.6–11.0)

## 2014-06-28 MED ORDER — PREDNISONE 10 MG PO TABS: 10.0000 mg | ORAL_TABLET | Freq: Every day | ORAL | Status: DC

## 2014-06-28 MED ORDER — OXYCODONE-ACETAMINOPHEN 5-325 MG PO TABS
ORAL_TABLET | ORAL | Status: AC
  Administered 2014-06-28: 2 via ORAL
  Filled 2014-06-28: qty 2

## 2014-06-28 MED ORDER — OXYCODONE-ACETAMINOPHEN 5-325 MG PO TABS
2.0000 | ORAL_TABLET | Freq: Once | ORAL | Status: AC
Start: 2014-06-28 — End: 2014-06-28
  Administered 2014-06-28: 2 via ORAL

## 2014-06-28 MED ORDER — OXYCODONE-ACETAMINOPHEN 5-325 MG PO TABS: 1.0000 | ORAL_TABLET | Freq: Four times a day (QID) | ORAL | Status: DC | PRN

## 2014-06-28 MED ORDER — PREDNISONE 20 MG PO TABS
60.0000 mg | ORAL_TABLET | Freq: Once | ORAL | Status: AC
Start: 2014-06-28 — End: 2014-06-28
  Administered 2014-06-28: 60 mg via ORAL

## 2014-06-28 MED ORDER — PREDNISONE 20 MG PO TABS
ORAL_TABLET | ORAL | Status: AC
  Administered 2014-06-28: 60 mg via ORAL
  Filled 2014-06-28: qty 3

## 2014-06-28 NOTE — ED Provider Notes (Signed)
[facility] Emergency Department Provider Note     Time seen: ----------------------------------------- 8:32 AM on 06/28/2014 -----------------------------------------    I have reviewed the triage vital signs and the nursing notes.   HISTORY  Chief Complaint No chief complaint on file.    HPI Madison Fry is a 81 y.o. female with sharp left-sided low back pain for last 3 weeks. Moving around makes it worse, nothing really benefits the pain. It's intermittent, currently moderate. Patient denies fevers chills or other complaints, has recently been treated for a urinary tract infection.     No past medical history on file.  There are no active problems to display for this patient.   No past surgical history on file.  No current outpatient prescriptions on file.  Allergies Review of patient's allergies indicates not on file.  No family history on file.  Social History History  Substance Use Topics  . Smoking status: Not on file  . Smokeless tobacco: Not on file  . Alcohol Use: Not on file    Review of Systems Constitutional: Negative for fever. Eyes: Negative for visual changes. ENT: Negative for sore throat. Cardiovascular: Negative for chest pain. Respiratory: Negative for shortness of breath. Gastrointestinal: Negative for abdominal pain, vomiting and diarrhea. Genitourinary: Negative for dysuria. Musculoskeletal: Positive for left-sided low back pain Skin: Negative for rash. Neurological: Negative for headaches, focal weakness or numbness.  10-point ROS otherwise negative.  ____________________________________________   PHYSICAL EXAM:  VITAL SIGNS: ED Triage Vitals  Enc Vitals Group     BP --      Pulse --      Resp --      Temp --      Temp src --      SpO2 --      Weight --      Height --      Head Cir --      Peak Flow --      Pain Score --      Pain Loc --      Pain Edu? --      Excl. in [place]? --      Constitutional: Alert and oriented. Well appearing and in no distress. Eyes: Conjunctivae are normal. PERRL. Normal extraocular movements. ENT   Head: Normocephalic and atraumatic.   Nose: No congestion/rhinnorhea.   Mouth/Throat: Mucous membranes are moist.   Neck: No stridor. Hematological/Lymphatic/Immunilogical: No cervical lymphadenopathy. Cardiovascular: Normal rate, regular rhythm. Normal and symmetric distal pulses are present in all extremities. No murmurs, rubs, or gallops. Respiratory: Normal respiratory effort without tachypnea nor retractions. Breath sounds are clear and equal bilaterally. No wheezes/rales/rhonchi. Gastrointestinal: Soft and nontender. No distention. No abdominal bruits. There is no CVA tenderness. Musculoskeletal: Patient is tender over the left buttock, has a positive straight leg raise examination of the left, no CVA tenderness. Neurologic:  Normal speech and language. No gross focal neurologic deficits are appreciated. Speech is normal. No gait instability. Skin:  Skin is warm, dry and intact. No rash noted. Psychiatric: Mood and affect are normal. Speech and behavior are normal. Patient exhibits appropriate insight and judgment.  ____________________________________________    LABS (pertinent positives/negatives)  Labs Reviewed  COMPREHENSIVE METABOLIC PANEL - Abnormal; Notable for the following:    Potassium 3.3 (*)    Chloride 100 (*)    Glucose, Bld 172 (*)    ALT 9 (*)    All other components within normal limits  URINALYSIS COMPLETEWITH MICROSCOPIC ([facility] ONLY) - Abnormal; Notable  for the following:    Color, Urine YELLOW (*)    APPearance CLEAR (*)    Leukocytes, UA TRACE (*)    Squamous Epithelial / LPF 6-30 (*)    All other components within normal limits  CBC WITH DIFFERENTIAL/PLATELET   ____________________________________________  ED COURSE:  Pertinent labs & imaging results that were available during my care of  the patient were reviewed by me and considered in my medical decision making (see chart for details).  Likely sciatica, will check abdominal labs to ensure that missing intra-abdominal pathology. ____________________________________________   RADIOLOGY  CT of the pelvis without contrast  IMPRESSION: Negative for kidney stones or hydronephrosis.  Postsurgical changes consistent with a partial colectomy. There is a tubular fat-containing structure in the anterior abdomen which is likely related to postoperative change. No significant inflammation in this area. No evidence for bowel obstruction.  Stable left ovarian dermoid.  Degenerative changes in the lumbar spine and both hips. ____________________________________________    FINAL ASSESSMENT AND PLAN  Sciatica  Plan: Labs are unremarkable here, no intra-abdominal pathology. Findings consistent with sciatica. Patient given prednisone, we'll continue pain medicine and prednisone taper as an outpatient.    Earleen Newport, MD   Earleen Newport, MD 06/28/14 [phone]

## 2014-06-28 NOTE — Discharge Instructions (Signed)
Sciatica Sciatica is pain, weakness, numbness, or tingling along the path of the sciatic nerve. The nerve starts in the lower back and runs down the back of each leg. The nerve controls the muscles in the lower leg and in the back of the knee, while also providing sensation to the back of the thigh, lower leg, and the sole of your foot. Sciatica is a symptom of another medical condition. For instance, nerve damage or certain conditions, such as a herniated disk or bone spur on the spine, pinch or put pressure on the sciatic nerve. This causes the pain, weakness, or other sensations normally associated with sciatica. Generally, sciatica only affects one side of the body. CAUSES   Herniated or slipped disc.  Degenerative disk disease.  A pain disorder involving the narrow muscle in the buttocks (piriformis syndrome).  Pelvic injury or fracture.  Pregnancy.  Tumor (rare). SYMPTOMS  Symptoms can vary from mild to very severe. The symptoms usually travel from the low back to the buttocks and down the back of the leg. Symptoms can include:  Mild tingling or dull aches in the lower back, leg, or hip.  Numbness in the back of the calf or sole of the foot.  Burning sensations in the lower back, leg, or hip.  Sharp pains in the lower back, leg, or hip.  Leg weakness.  Severe back pain inhibiting movement. These symptoms may get worse with coughing, sneezing, laughing, or prolonged sitting or standing. Also, being overweight may worsen symptoms. DIAGNOSIS  Your caregiver will perform a physical exam to look for common symptoms of sciatica. He or she may ask you to do certain movements or activities that would trigger sciatic nerve pain. Other tests may be performed to find the cause of the sciatica. These may include:  Blood tests.  X-rays.  Imaging tests, such as an MRI or CT scan. TREATMENT  Treatment is directed at the cause of the sciatic pain. Sometimes, treatment is not necessary  and the pain and discomfort goes away on its own. If treatment is needed, your caregiver may suggest:  Over-the-counter medicines to relieve pain.  Prescription medicines, such as anti-inflammatory medicine, muscle relaxants, or narcotics.  Applying heat or ice to the painful area.  Steroid injections to lessen pain, irritation, and inflammation around the nerve.  Reducing activity during periods of pain.  Exercising and stretching to strengthen your abdomen and improve flexibility of your spine. Your caregiver may suggest losing weight if the extra weight makes the back pain worse.  Physical therapy.  Surgery to eliminate what is pressing or pinching the nerve, such as a bone spur or part of a herniated disk. HOME CARE INSTRUCTIONS   Only take over-the-counter or prescription medicines for pain or discomfort as directed by your caregiver.  Apply ice to the affected area for 20 minutes, 3-4 times a day for the first 48-72 hours. Then try heat in the same way.  Exercise, stretch, or perform your usual activities if these do not aggravate your pain.  Attend physical therapy sessions as directed by your caregiver.  Keep all follow-up appointments as directed by your caregiver.  Do not wear high heels or shoes that do not provide proper support.  Check your mattress to see if it is too soft. A firm mattress may lessen your pain and discomfort. SEEK IMMEDIATE MEDICAL CARE IF:   You lose control of your bowel or bladder (incontinence).  You have increasing weakness in the lower back, pelvis, buttocks,   or legs.  You have redness or swelling of your back.  You have a burning sensation when you urinate.  You have pain that gets worse when you lie down or awakens you at night.  Your pain is worse than you have experienced in the past.  Your pain is lasting longer than 4 weeks.  You are suddenly losing weight without reason. MAKE SURE YOU:  Understand these  instructions.  Will watch your condition.  Will get help right away if you are not doing well or get worse. Document Released: 01/06/2001 Document Revised: 07/14/2011 Document Reviewed: 05/24/2011 Exit[employer] Patient Information 2015 [employer]. This information is not intended to replace advice given to you by your health care provider. Make sure you discuss any questions you have with your health care provider.  

## 2014-06-28 NOTE — ED Notes (Signed)
MD at bedside. 

## 2014-06-28 NOTE — ED Notes (Signed)
Brought in via ems from home with a 3 week hx of left flank/back pain .Marland Kitchenstates pain is worse today and radiates into left leg..denies any urinary sxs'

## 2014-07-23 ENCOUNTER — Telehealth: Payer: Self-pay | Admitting: *Deleted

## 2014-07-23 DIAGNOSIS — C50919 Malignant neoplasm of unspecified site of unspecified female breast: Secondary | ICD-10-CM

## 2014-07-23 MED ORDER — TAMOXIFEN CITRATE 20 MG PO TABS: 20.0000 mg | ORAL_TABLET | Freq: Every day | ORAL | Status: DC

## 2014-07-23 NOTE — Telephone Encounter (Signed)
escribed

## 2014-08-17 ENCOUNTER — Emergency Department: Payer: Commercial Managed Care - HMO

## 2014-08-17 ENCOUNTER — Inpatient Hospital Stay
Admit: 2014-08-17 | Discharge: 2014-08-17 | Disposition: A | Discharge status: Home / Self Care | Payer: Commercial Managed Care - HMO | Attending: Internal Medicine | Admitting: Internal Medicine

## 2014-08-17 ENCOUNTER — Inpatient Hospital Stay
Admission: EM | Admit: 2014-08-17 | Discharge: 2014-08-20 | DRG: 690 | Disposition: A | Discharge status: Home / Self Care | Payer: Commercial Managed Care - HMO | Attending: Internal Medicine | Admitting: Internal Medicine

## 2014-08-17 ENCOUNTER — Encounter: Payer: Self-pay | Admitting: Medical Oncology

## 2014-08-17 DIAGNOSIS — W19XXXA Unspecified fall, initial encounter: Secondary | ICD-10-CM | POA: Diagnosis present

## 2014-08-17 DIAGNOSIS — R748 Abnormal levels of other serum enzymes: Secondary | ICD-10-CM | POA: Diagnosis present

## 2014-08-17 DIAGNOSIS — S32591A Other specified fracture of right pubis, initial encounter for closed fracture: Secondary | ICD-10-CM

## 2014-08-17 DIAGNOSIS — B9620 Unspecified Escherichia coli [E. coli] as the cause of diseases classified elsewhere: Secondary | ICD-10-CM | POA: Diagnosis present

## 2014-08-17 DIAGNOSIS — I959 Hypotension, unspecified: Secondary | ICD-10-CM | POA: Diagnosis present

## 2014-08-17 DIAGNOSIS — Z9889 Other specified postprocedural states: Secondary | ICD-10-CM

## 2014-08-17 DIAGNOSIS — S32592A Other specified fracture of left pubis, initial encounter for closed fracture: Secondary | ICD-10-CM | POA: Diagnosis present

## 2014-08-17 DIAGNOSIS — E890 Postprocedural hypothyroidism: Secondary | ICD-10-CM | POA: Diagnosis present

## 2014-08-17 DIAGNOSIS — Z9049 Acquired absence of other specified parts of digestive tract: Secondary | ICD-10-CM | POA: Diagnosis present

## 2014-08-17 DIAGNOSIS — M1990 Unspecified osteoarthritis, unspecified site: Secondary | ICD-10-CM | POA: Diagnosis present

## 2014-08-17 DIAGNOSIS — Z794 Long term (current) use of insulin: Secondary | ICD-10-CM

## 2014-08-17 DIAGNOSIS — N390 Urinary tract infection, site not specified: Secondary | ICD-10-CM | POA: Diagnosis not present

## 2014-08-17 DIAGNOSIS — M25559 Pain in unspecified hip: Secondary | ICD-10-CM | POA: Diagnosis present

## 2014-08-17 DIAGNOSIS — E860 Dehydration: Secondary | ICD-10-CM | POA: Diagnosis present

## 2014-08-17 DIAGNOSIS — R7989 Other specified abnormal findings of blood chemistry: Secondary | ICD-10-CM

## 2014-08-17 DIAGNOSIS — I10 Essential (primary) hypertension: Secondary | ICD-10-CM | POA: Diagnosis present

## 2014-08-17 DIAGNOSIS — R002 Palpitations: Secondary | ICD-10-CM | POA: Diagnosis present

## 2014-08-17 DIAGNOSIS — Z79899 Other long term (current) drug therapy: Secondary | ICD-10-CM

## 2014-08-17 DIAGNOSIS — R778 Other specified abnormalities of plasma proteins: Secondary | ICD-10-CM | POA: Diagnosis present

## 2014-08-17 DIAGNOSIS — Z853 Personal history of malignant neoplasm of breast: Secondary | ICD-10-CM

## 2014-08-17 DIAGNOSIS — E119 Type 2 diabetes mellitus without complications: Secondary | ICD-10-CM | POA: Diagnosis present

## 2014-08-17 HISTORY — DX: Malignant (primary) neoplasm, unspecified: C80.1

## 2014-08-17 LAB — CBC WITH DIFFERENTIAL/PLATELET
Basophils Absolute: 0.1 10*3/uL (ref 0–0.1)
Basophils Relative: 0 %
EOS ABS: 0.1 10*3/uL (ref 0–0.7)
EOS PCT: 0 %
HEMATOCRIT: 36.6 % (ref 35.0–47.0)
HEMOGLOBIN: 12.3 g/dL (ref 12.0–16.0)
LYMPHS PCT: 5 %
Lymphs Abs: 0.7 10*3/uL — ABNORMAL LOW (ref 1.0–3.6)
MCH: 31.3 pg (ref 26.0–34.0)
MCHC: 33.5 g/dL (ref 32.0–36.0)
MCV: 93.4 fL (ref 80.0–100.0)
MONO ABS: 0.4 10*3/uL (ref 0.2–0.9)
MONOS PCT: 3 %
NEUTROS ABS: 13.6 10*3/uL — AB (ref 1.4–6.5)
NEUTROS PCT: 92 %
PLATELETS: 164 10*3/uL (ref 150–440)
RBC: 3.92 MIL/uL (ref 3.80–5.20)
RDW: 14.3 % (ref 11.5–14.5)
WBC: 14.8 10*3/uL — AB (ref 3.6–11.0)

## 2014-08-17 LAB — URINALYSIS COMPLETE WITH MICROSCOPIC (ARMC ONLY)
Bilirubin Urine: NEGATIVE
Glucose, UA: NEGATIVE mg/dL
Hgb urine dipstick: NEGATIVE
Ketones, ur: NEGATIVE mg/dL
Nitrite: POSITIVE — AB
Protein, ur: NEGATIVE mg/dL
RBC / HPF: NONE SEEN RBC/hpf (ref 0–5)
Specific Gravity, Urine: 1.018 (ref 1.005–1.030)
pH: 5 (ref 5.0–8.0)

## 2014-08-17 LAB — BASIC METABOLIC PANEL
Anion gap: 11 (ref 5–15)
BUN: 19 mg/dL (ref 6–20)
CO2: 27 mmol/L (ref 22–32)
Calcium: 8.7 mg/dL — ABNORMAL LOW (ref 8.9–10.3)
Chloride: 99 mmol/L — ABNORMAL LOW (ref 101–111)
Creatinine, Ser: 0.89 mg/dL (ref 0.44–1.00)
GFR calc Af Amer: >60 mL/min (ref >60)
GFR calc non Af Amer: 58 mL/min — ABNORMAL LOW (ref >60)
Glucose, Bld: 170 mg/dL — ABNORMAL HIGH (ref 65–99)
Potassium: 3.2 mmol/L — ABNORMAL LOW (ref 3.5–5.1)
Sodium: 137 mmol/L (ref 135–145)

## 2014-08-17 LAB — GLUCOSE, CAPILLARY
GLUCOSE-CAPILLARY: 116 mg/dL — AB (ref 65–99)
GLUCOSE-CAPILLARY: 164 mg/dL — AB (ref 65–99)
Glucose-Capillary: 142 mg/dL — ABNORMAL HIGH (ref 65–99)

## 2014-08-17 LAB — TROPONIN I
Troponin I: 0.22 ng/mL — ABNORMAL HIGH (ref <0.031)
Troponin I: 0.17 ng/mL — ABNORMAL HIGH (ref <0.031)
Troponin I: 0.17 ng/mL — ABNORMAL HIGH (ref <0.031)

## 2014-08-17 MED ORDER — INSULIN ASPART 100 UNIT/ML ~~LOC~~ SOLN
0.0000 [IU] | Freq: Three times a day (TID) | SUBCUTANEOUS | Status: DC
  Administered 2014-08-18 – 2014-08-19 (×4): 2 [IU] via SUBCUTANEOUS
  Administered 2014-08-19: 1 [IU] via SUBCUTANEOUS
  Administered 2014-08-20: 2 [IU] via SUBCUTANEOUS
  Filled 2014-08-17 (×4): qty 2
  Filled 2014-08-17: qty 1
  Filled 2014-08-17: qty 2

## 2014-08-17 MED ORDER — POTASSIUM CHLORIDE 20 MEQ PO PACK
40.0000 meq | PACK | Freq: Once | ORAL | Status: AC
  Administered 2014-08-17: 40 meq via ORAL
  Filled 2014-08-17: qty 2

## 2014-08-17 MED ORDER — LEVOTHYROXINE SODIUM 50 MCG PO TABS
50.0000 ug | ORAL_TABLET | ORAL | Status: DC
  Administered 2014-08-18 – 2014-08-20 (×3): 50 ug via ORAL
  Filled 2014-08-17 (×4): qty 1

## 2014-08-17 MED ORDER — BISACODYL 5 MG PO TBEC: 5.0000 mg | DELAYED_RELEASE_TABLET | Freq: Every day | ORAL | Status: DC | PRN

## 2014-08-17 MED ORDER — DEXTROSE 5 % IV SOLN
1.0000 g | Freq: Once | INTRAVENOUS | Status: AC
  Administered 2014-08-17: 1 g via INTRAVENOUS
  Filled 2014-08-17: qty 10

## 2014-08-17 MED ORDER — DOCUSATE SODIUM 100 MG PO CAPS
100.0000 mg | ORAL_CAPSULE | Freq: Two times a day (BID) | ORAL | Status: DC
  Administered 2014-08-17 – 2014-08-20 (×6): 100 mg via ORAL
  Filled 2014-08-17 (×7): qty 1

## 2014-08-17 MED ORDER — TAMOXIFEN CITRATE 10 MG PO TABS
20.0000 mg | ORAL_TABLET | Freq: Every day | ORAL | Status: DC
  Administered 2014-08-18 – 2014-08-20 (×3): 20 mg via ORAL
  Filled 2014-08-17 (×3): qty 2

## 2014-08-17 MED ORDER — ASPIRIN EC 325 MG PO TBEC
325.0000 mg | DELAYED_RELEASE_TABLET | Freq: Every day | ORAL | Status: DC
  Administered 2014-08-18 – 2014-08-20 (×3): 325 mg via ORAL
  Filled 2014-08-17 (×3): qty 1

## 2014-08-17 MED ORDER — ENOXAPARIN SODIUM 30 MG/0.3ML ~~LOC~~ SOLN
30.0000 mg | SUBCUTANEOUS | Status: DC
  Administered 2014-08-17: 30 mg via SUBCUTANEOUS

## 2014-08-17 MED ORDER — CHLORTHALIDONE 25 MG PO TABS
50.0000 mg | ORAL_TABLET | Freq: Every day | ORAL | Status: DC
  Administered 2014-08-18 – 2014-08-20 (×2): 50 mg via ORAL
  Filled 2014-08-17 (×2): qty 2

## 2014-08-17 MED ORDER — CEFTRIAXONE SODIUM IN DEXTROSE 20 MG/ML IV SOLN: 1.0000 g | INTRAVENOUS | Status: DC

## 2014-08-17 MED ORDER — ASPIRIN 81 MG PO CHEW
324.0000 mg | CHEWABLE_TABLET | Freq: Once | ORAL | Status: AC
  Administered 2014-08-17: 324 mg via ORAL
  Filled 2014-08-17: qty 4

## 2014-08-17 MED ORDER — TRAMADOL HCL 50 MG PO TABS
100.0000 mg | ORAL_TABLET | Freq: Two times a day (BID) | ORAL | Status: DC
Start: 2014-08-17 — End: 2014-08-20
  Administered 2014-08-17 – 2014-08-20 (×6): 100 mg via ORAL
  Filled 2014-08-17 (×6): qty 2

## 2014-08-17 MED ORDER — LOSARTAN POTASSIUM 50 MG PO TABS
100.0000 mg | ORAL_TABLET | Freq: Every day | ORAL | Status: DC
  Administered 2014-08-18 – 2014-08-20 (×2): 100 mg via ORAL
  Filled 2014-08-17 (×2): qty 2

## 2014-08-17 MED ORDER — ONDANSETRON HCL 4 MG PO TABS: 4.0000 mg | ORAL_TABLET | Freq: Four times a day (QID) | ORAL | Status: DC | PRN

## 2014-08-17 MED ORDER — ACETAMINOPHEN 325 MG PO TABS: 650.0000 mg | ORAL_TABLET | Freq: Four times a day (QID) | ORAL | Status: DC | PRN

## 2014-08-17 MED ORDER — ONDANSETRON HCL 4 MG/2ML IJ SOLN: 4.0000 mg | Freq: Four times a day (QID) | INTRAMUSCULAR | Status: DC | PRN

## 2014-08-17 MED ORDER — DEXTROSE 5 % IV SOLN
1.0000 g | INTRAVENOUS | Status: DC
  Administered 2014-08-17 – 2014-08-20 (×4): 1 g via INTRAVENOUS
  Filled 2014-08-17 (×5): qty 10

## 2014-08-17 MED ORDER — MORPHINE SULFATE 2 MG/ML IJ SOLN: 2.0000 mg | INTRAMUSCULAR | Status: DC | PRN

## 2014-08-17 MED ORDER — EZETIMIBE 10 MG PO TABS
10.0000 mg | ORAL_TABLET | Freq: Every day | ORAL | Status: DC
  Administered 2014-08-18 – 2014-08-20 (×3): 10 mg via ORAL
  Filled 2014-08-17 (×3): qty 1

## 2014-08-17 MED ORDER — FAMOTIDINE 20 MG PO TABS
20.0000 mg | ORAL_TABLET | Freq: Every day | ORAL | Status: DC
  Administered 2014-08-17 – 2014-08-20 (×4): 20 mg via ORAL
  Filled 2014-08-17 (×4): qty 1

## 2014-08-17 MED ORDER — ALUM & MAG HYDROXIDE-SIMETH 200-200-20 MG/5ML PO SUSP: 30.0000 mL | Freq: Four times a day (QID) | ORAL | Status: DC | PRN

## 2014-08-17 MED ORDER — OXYCODONE-ACETAMINOPHEN 5-325 MG PO TABS: 1.0000 | ORAL_TABLET | Freq: Four times a day (QID) | ORAL | Status: DC | PRN

## 2014-08-17 MED ORDER — ATENOLOL-CHLORTHALIDONE 50-25 MG PO TABS: 2.0000 | ORAL_TABLET | Freq: Every day | ORAL | Status: DC

## 2014-08-17 MED ORDER — ACETAMINOPHEN 650 MG RE SUPP
650.0000 mg | Freq: Four times a day (QID) | RECTAL | Status: DC | PRN
Start: 2014-08-17 — End: 2014-08-20

## 2014-08-17 MED ORDER — POTASSIUM CHLORIDE IN NACL 20-0.9 MEQ/L-% IV SOLN
INTRAVENOUS | Status: AC
  Administered 2014-08-17: 14:00:00 via INTRAVENOUS
  Filled 2014-08-17: qty 1000

## 2014-08-17 MED ORDER — COLESEVELAM HCL 625 MG PO TABS
625.0000 mg | ORAL_TABLET | Freq: Two times a day (BID) | ORAL | Status: DC
  Administered 2014-08-17 – 2014-08-20 (×6): 625 mg via ORAL
  Filled 2014-08-17 (×8): qty 1

## 2014-08-17 MED ORDER — ATENOLOL 25 MG PO TABS
100.0000 mg | ORAL_TABLET | Freq: Every day | ORAL | Status: DC
  Administered 2014-08-18 – 2014-08-20 (×2): 100 mg via ORAL
  Filled 2014-08-17 (×2): qty 4

## 2014-08-17 NOTE — ED Notes (Signed)
CRITICAL VALUE ALERT  Critical value received:  Troponin  Date of notification:  08/17/14  Time of notification:  0938  Critical value read back: YES  Nurse who received alert:  Corey Caulfield,RN  MD notified (1st page): 0940  Time of first page:   MD notified (2nd page):  Time of second page:  Responding MD:Dr Archie Balboa  Time MD responded:

## 2014-08-17 NOTE — ED Provider Notes (Signed)
[facility] Emergency Department Provider Note   ____________________________________________  Time seen: On EMS arrival  I have reviewed the triage vital signs and the nursing notes.   HISTORY  Chief Complaint Fall and Hip Pain   History limited by: Not Limited   HPI Madison Fry is a 81 y.o. female who presents to the emergency department via EMS today because of left hip pain. The patient does state she had a fall yesterday and fell onto her left hip. She is walking outside to get to her motorized wheelchair when she fell. She is unsure why she fell. She states she might of felt a little dizzy at the time. Denies any chest pain at the time. During the fall the patient states she landed on her left hip. She has had pain since then. She denies any other injury. Denies hitting her head or loss of consciousness. She denies any fevers.   Past Medical History  Diagnosis Date  . Diabetes mellitus without complication   . Hypertension     There are no active problems to display for this patient.   No past surgical history on file.  Current Outpatient Rx  Name  Route  Sig  Dispense  Refill  . atenolol (TENORMIN) 50 MG tablet   Oral   Take 50 mg by mouth daily.         . colesevelam (WELCHOL) 625 MG tablet   Oral   Take by mouth 2 (two) times daily with a meal.         . ezetimibe (ZETIA) 10 MG tablet   Oral   Take 10 mg by mouth daily.         Marland Kitchen glipiZIDE (GLUCOTROL) 10 MG tablet   Oral   Take 10 mg by mouth daily before breakfast.         . metFORMIN (GLUMETZA) 500 MG (MOD) 24 hr tablet   Oral   Take 500 mg by mouth daily with breakfast.         . oxyCODONE-acetaminophen (ROXICET) 5-325 MG per tablet   Oral   Take 1 tablet by mouth every 6 (six) hours as needed.   20 tablet   0   . predniSONE (DELTASONE) 10 MG tablet   Oral   Take 1 tablet (10 mg total) by mouth daily. Take 6 tablets tomorrow, then decrease by one tablet  daily until gone.   21 tablet   0   . tamoxifen (NOLVADEX) 20 MG tablet   Oral   Take 1 tablet (20 mg total) by mouth daily.   90 tablet   0     Allergies Shellfish allergy  No family history on file.  Social History History  Substance Use Topics  . Smoking status: Never Smoker   . Smokeless tobacco: Not on file  . Alcohol Use: No    Review of Systems  Constitutional: Negative for fever. Cardiovascular: Negative for chest pain. Respiratory: Negative for shortness of breath. Gastrointestinal: Negative for abdominal pain, vomiting. Did have some diarrhea yesterday Genitourinary: Negative for dysuria. Musculoskeletal: Negative for back pain. Left hip pain Skin: Negative for rash. Neurological: Negative for headaches, focal weakness or numbness.   10-point ROS otherwise negative.  ____________________________________________   PHYSICAL EXAM:  VITAL SIGNS: ED Triage Vitals  Enc Vitals Group     BP --      Pulse --      Resp --      Temp --  Temp src --      SpO2 --      Weight --      Height --      Head Cir --      Peak Flow --      Pain Score --      Pain Loc --      Pain Edu? --      Excl. in [place]? --      Constitutional: Alert and oriented. Well appearing and in no distress. Eyes: Conjunctivae are normal. PERRL. Normal extraocular movements. ENT   Head: Normocephalic and atraumatic.   Nose: No congestion/rhinnorhea.   Mouth/Throat: Mucous membranes are moist.   Neck: No stridor. Hematological/Lymphatic/Immunilogical: No cervical lymphadenopathy. Cardiovascular: Normal rate, regular rhythm.  No murmurs, rubs, or gallops. Respiratory: Normal respiratory effort without tachypnea nor retractions. Breath sounds are clear and equal bilaterally. No wheezes/rales/rhonchi. Gastrointestinal: Soft and nontender. No distention. There is no CVA tenderness. Genitourinary: Deferred Musculoskeletal: Tenderness to palpation of the left hip. Pain  with external and internal rotation of the left leg. NV intact distally.  Neurologic:  Normal speech and language. No gross focal neurologic deficits are appreciated. Speech is normal.  Skin:  Skin is warm, dry and intact. No rash noted. Psychiatric: Mood and affect are normal. Speech and behavior are normal. Patient exhibits appropriate insight and judgment.  ____________________________________________    LABS (pertinent positives/negatives)  Labs Reviewed  CBC WITH DIFFERENTIAL/PLATELET - Abnormal; Notable for the following:    WBC 14.8 (*)    Neutro Abs 13.6 (*)    Lymphs Abs 0.7 (*)    All other components within normal limits  BASIC METABOLIC PANEL - Abnormal; Notable for the following:    Potassium 3.2 (*)    Chloride 99 (*)    Glucose, Bld 170 (*)    Calcium 8.7 (*)    GFR calc non Af Amer 58 (*)    All other components within normal limits  TROPONIN I - Abnormal; Notable for the following:    Troponin I 0.22 (*)    All other components within normal limits  URINALYSIS COMPLETEWITH MICROSCOPIC ([facility] ONLY) - Abnormal; Notable for the following:    Color, Urine YELLOW (*)    APPearance CLOUDY (*)    Nitrite POSITIVE (*)    Leukocytes, UA 2+ (*)    Bacteria, UA MANY (*)    Squamous Epithelial / LPF 6-30 (*)    All other components within normal limits     ____________________________________________   EKG  I, Nance Pear, attending physician, personally viewed and interpreted this EKG  EKG Time: 0850 Rate: 77 Rhythm: Normal sinus rhythm Axis: Normal Intervals: qtc 466 QRS: Narrow ST changes: No ST elevation ____________________________________________    RADIOLOGY  Left hip RADIOLOGY INTERPRETATION  I, Nance Pear, attending physician, personally viewed and interpreted these images: Location: Left hip Type of study: Plain film Number of views: 3 Pertinent positive/negative findings: No obvious femur fracture, fracture of pubic rami Clinical  Impression:Pubic rami fracture   Radiology Read IMPRESSION: 1. Slightly displaced fractures of the left superior and inferior pubic rami. 2. Severe lumbar spine and bilateral hip degenerative change. Diffuse osteopenia . 3. Aortoiliac atherosclerotic vascular disease.  ____________________________________________   PROCEDURES  Procedure(s) performed: None  Critical Care performed: No  ____________________________________________   INITIAL IMPRESSION / ASSESSMENT AND PLAN / ED COURSE  Pertinent labs & imaging results that were available during my care of the patient were reviewed by me and considered in  my medical decision making (see chart for details).  Patient presented to the emergency department today after a fall yesterday and subsequent left hip pain. X-rays show pubic rami fractures on the left. Unfortunately the patient's troponin was elevated and thus I'm concerned that patient perhaps fell yesterday after ACS. Will plan on admitting patient for further workup and evaluation.  ____________________________________________   FINAL CLINICAL IMPRESSION(S) / ED DIAGNOSES  Final diagnoses:  Elevated troponin  Fracture of multiple pubic rami, right, closed, initial encounter  UTI   Nance Pear, MD 08/17/14 1034

## 2014-08-17 NOTE — H&P (Signed)
[place] [facility] [place] NAME: Madison Fry    MR#:  [id number]  DATE OF BIRTH:  30-Sep-1928  DATE OF ADMISSION:  08/17/2014  PRIMARY CARE PHYSICIAN: Mayra Neer, MD   REQUESTING/REFERRING PHYSICIAN: Dr. Archie Balboa CHIEF COMPLAINT:  Dizziness, fall and hip pain HISTORY OF PRESENT ILLNESS:  Madison Fry  is a 81 y.o. female with a known history of recent episodes of intermittent palpitations, hypertension, diabetes mellitus and osteoarthritis was brought into the ED via EMS after she sustained a fall. Patient was reporting that she was feeling dizzy , almost passed out and fell on her left hip yesterday. Patient is not quite sure why she fell. Yesterday prior to fall she was feeling dizzy but denies any palpitations. Patient reports that she has had thyroid surgery in the past and takes thyroid supplements, lately she has been experiencing her heart racing. Denies any chest pain or shortness of breath. Denies any cardiac history are not seen by any cardiologist in the past. Left hip x-ray in the ED has revealed superior and inferior pubic rami fracture. Troponin is elevated at 0.22  PAST MEDICAL HISTORY:   Past Medical History  Diagnosis Date  . Diabetes mellitus without complication   . Hypertension    hypothyroidism following thyroid surgery Palpitations  PAST SURGICAL HISTOIRY:  Appendectomy Thyroid surgery Right carotid endarterectomy with stent placement Abdominal surgery for polypectomy  SOCIAL HISTORY:   History  Substance Use Topics  . Smoking status: Never Smoker   . Smokeless tobacco: Not on file  . Alcohol Use: No    FAMILY HISTORY:  No family history on file.  DRUG ALLERGIES:   Allergies  Allergen Reactions  . Shellfish Allergy Swelling    REVIEW OF SYSTEMS:  CONSTITUTIONAL: No fever, fatigue or weakness.  EYES: No blurred or double vision.  EARS, NOSE, AND THROAT: No tinnitus or ear pain.  RESPIRATORY:  No cough, shortness of breath, wheezing or hemoptysis.  CARDIOVASCULAR: Reporting dizziness and palpitations No chest pain, orthopnea, edema.  GASTROINTESTINAL: No nausea, vomiting, diarrhea or abdominal pain.  GENITOURINARY: No dysuria, hematuria.  ENDOCRINE: No dysuria, nocturia, reporting polyuria HEMATOLOGY: No anemia, easy bruising or bleeding SKIN: No rash or lesion. MUSCULOSKELETAL: No joint pain or arthritis.  Has history of osteoarthritis NEUROLOGIC: No tingling, numbness, weakness.  PSYCHIATRY: No anxiety or depression.   MEDICATIONS AT HOME:   Prior to Admission medications   Medication Sig Start Date End Date Taking? Authorizing Provider  atenolol-chlorthalidone (TENORETIC) 50-25 MG per tablet Take 2 tablets by mouth daily.   Yes Historical Provider, MD  colesevelam (WELCHOL) 625 MG tablet Take by mouth 2 (two) times daily with a meal.   Yes Historical Provider, MD  ezetimibe (ZETIA) 10 MG tablet Take 10 mg by mouth daily.   Yes Historical Provider, MD  glimepiride (AMARYL) 2 MG tablet Take 2 mg by mouth daily with breakfast.   Yes Historical Provider, MD  levothyroxine (SYNTHROID, LEVOTHROID) 50 MCG tablet Take 50 mcg by mouth daily.   Yes Historical Provider, MD  losartan (COZAAR) 100 MG tablet Take 100 mg by mouth daily.   Yes Historical Provider, MD  metFORMIN (GLUMETZA) 500 MG (MOD) 24 hr tablet Take 500 mg by mouth daily with breakfast.   Yes Historical Provider, MD  tamoxifen (NOLVADEX) 20 MG tablet Take 1 tablet (20 mg total) by mouth daily. 07/23/14  Yes Lloyd Huger, MD  traMADol (ULTRAM) 50 MG tablet Take 100 mg by mouth 2 (two)  times daily.   Yes Historical Provider, MD  oxyCODONE-acetaminophen (ROXICET) 5-325 MG per tablet Take 1 tablet by mouth every 6 (six) hours as needed. Patient not taking: Reported on 08/17/2014 06/28/14   Earleen Newport, MD  predniSONE (DELTASONE) 10 MG tablet Take 1 tablet (10 mg total) by mouth daily. Take 6 tablets tomorrow, then  decrease by one tablet daily until gone. Patient not taking: Reported on 08/17/2014 06/28/14   Earleen Newport, MD      VITAL SIGNS:  Blood pressure 99/81, pulse 75, temperature 98.3 F (36.8 C), temperature source Oral, resp. rate 21, height '5\' 4"'$  (1.626 m), weight 86.183 kg (190 lb), SpO2 95 %.  PHYSICAL EXAMINATION:  GENERAL:  81 y.o.-year-old patient lying in the bed with no acute distress.  EYES: Pupils equal, round, reactive to light and accommodation. No scleral icterus. Extraocular muscles intact.  HEENT: Head atraumatic, normocephalic. Oropharynx and nasopharynx clear.  NECK:  Supple, no jugular venous distention. No thyroid enlargement, no tenderness.  LUNGS: Normal breath sounds bilaterally, no wheezing, rales,rhonchi or crepitation. No use of accessory muscles of respiration.  CARDIOVASCULAR: S1, S2 normal. No murmurs, rubs, or gallops.  ABDOMEN: Soft, nontender, nondistended. Bowel sounds present. No organomegaly or mass.  EXTREMITIES: Left hip is tender and externally rotated. No pedal edema, cyanosis, or clubbing.  NEUROLOGIC: Cranial nerves II through XII are intact. Muscle strength 5/5 in all extremities except left hip joint. Sensation intact. Gait not checked.  PSYCHIATRIC: The patient is alert and oriented x 3.  SKIN: No obvious rash, lesion, or ulcer.   LABORATORY PANEL:   CBC  Recent Labs Lab 08/17/14 0903  WBC 14.8*  HGB 12.3  HCT 36.6  PLT 164   ------------------------------------------------------------------------------------------------------------------  Chemistries   Recent Labs Lab 08/17/14 0903  NA 137  K 3.2*  CL 99*  CO2 27  GLUCOSE 170*  BUN 19  CREATININE 0.89  CALCIUM 8.7*   ------------------------------------------------------------------------------------------------------------------  Cardiac Enzymes  Recent Labs Lab 08/17/14 0903  TROPONINI 0.22*    ------------------------------------------------------------------------------------------------------------------  RADIOLOGY:  Dg Chest Portable 1 View  08/17/2014   CLINICAL DATA:  Fall today  EXAM: PORTABLE CHEST - 1 VIEW  COMPARISON:  07/30/2009  FINDINGS: The heart is moderately enlarged. Lungs are clear. No pneumothorax. No rib fracture. No pleural effusion. Normal vascularity.  IMPRESSION: Cardiomegaly without decompensation.   Electronically Signed   By: Marybelle Killings M.D.   On: 08/17/2014 09:29   Dg Hip Unilat With Pelvis 2-3 Views Left  08/17/2014   CLINICAL DATA:  Hip pain.  Recent fall.  EXAM: DG HIP (WITH OR WITHOUT PELVIS) 2-3V LEFT  COMPARISON:  CT 07/16/2014  FINDINGS: Severe degenerative changes of the lumbar spine and both hips noted. Diffuse osteopenia. Fractures of the left superior inferior pubic rami are noted. These are slightly displaced. Aortoiliac atherosclerotic vascular disease. Pelvic calcifications consistent with phleboliths.  IMPRESSION: 1. Slightly displaced fractures of the left superior and inferior pubic rami. 2. Severe lumbar spine and bilateral hip degenerative change. Diffuse osteopenia . 3. Aortoiliac atherosclerotic vascular disease.   Electronically Signed   By: Marcello Moores  Register   On: 08/17/2014 09:24    EKG:  No orders found for this or any previous visit.  IMPRESSION AND PLAN:    Madison Fry  is a 81 y.o. female with a known history of recent episodes of intermittent palpitations, hypertension, diabetes mellitus and osteoarthritis was brought into the ED via EMS after she sustained a fall. Patient was reporting that she  was feeling dizzy , almost passed out and fell on her left hip yesterday.  1. Near syncope with intermittent episodes of palpitations: Will monitor her closely on telemetry Check orthostatic vitals Will obtain echocardiogram Cycle cardiac biomarkers Cardiology consult is placed Check TSH in a.m. If necessary will consider  carotid Dopplers as patient has history of right carotid endarterectomy. At this point as patient is reporting palpitations her dizzy spells can be from cardiac arrhythmias  2. Elevated troponin can be from cardiac arrhythmias as patient is reporting palpitations Monitor on telemetry Monitor Cardiac biomarker trend Cardiology is not recommending therapeutic anti-coagulation  at this time. Cardiology consult is placed to Dr. Saralyn Pilar, he is aware of the consult On aspirin and atenolol Check fasting lipid panel in a.m. Patient is on Zetia  3. Acute cystitis with leukocytosis and abnormal urinalysis Continue IV Rocephin which was started in the ED. Follow Urine culture results  4. Left hip pain secondary to left superior and inferior pubic ramus fracture Will provide pain management Will consider PT consult after ruling her out for ACS  5. Hypertension with soft blood pressure Continue atenolol and Cozaar with holding parameters  6. Diabetes mellitus Will provide sliding scale insulin Hold her home medications for diabetes  GI reflexes with Pepcid and DVT prophylaxis with heparin subcutaneous       All the records are reviewed and case discussed with ED provider. Management plans discussed with the patient, family and they are in agreement.  CODE STATUS: full code, daughter [place] OF THIS PATIENT, reviewing medical records including labs and imaging studies, performing history and physical, admission orders and coordination of care: 45 minutes.    Nicholes Mango M.D on 08/17/2014 at 11:16 AM  Between 7am to 6pm - Pager - [phone]  After 6pm go to [email or web address] - password [email or web address] [facility]  Office  [phone]  CC: Primary care physician; Mayra Neer, MD

## 2014-08-17 NOTE — ED Notes (Signed)
Pt reports falling yesterday around 1600. States that she is not exactly sure what made her fall but states that she has been feeling dizzy. Denies hitting head or LOC. C/o left hip pain.

## 2014-08-17 NOTE — Progress Notes (Signed)
*  PRELIMINARY RESULTS* Echocardiogram 2D Echocardiogram has been performed.  Madison Fry 08/17/2014, 2:25 PM

## 2014-08-17 NOTE — Consult Note (Signed)
[facility] Cardiology  CARDIOLOGY CONSULT NOTE  Patient ID: Madison Fry MRN: [id number] DOB/AGE: 1928/03/22 81 y.o.  Admit date: 08/17/2014 Referring Physician Gouru Primary Physician Mayra Neer Primary Cardiologist Reason for Consultation I fell  HPI: 81 year old female referred for evaluation of palpitations and borderline elevated troponin. She apparently was a use of health until day of admission when he presented to [facility] emergency room after falling. The patient apparently experienced dizziness but did not have loss of consciousness. The emergency room, x-ray revealed superior and inferior pubic rami fracture. The patient denies chest pain or shortness of breath. Troponin was elevated 0.22.  Review of systems complete and found to be negative unless listed above     Past Medical History  Diagnosis Date  . Diabetes mellitus without complication   . Hypertension   . Cancer     breast cancer, Right side lymph nodes removed    History reviewed. No pertinent past surgical history.  Prescriptions prior to admission  Medication Sig Dispense Refill Last Dose  . atenolol-chlorthalidone (TENORETIC) 50-25 MG per tablet Take 2 tablets by mouth daily.   08/17/2014 at am  . colesevelam [facility]) 625 MG tablet Take by mouth 2 (two) times daily with a meal.   08/17/2014 at am  . ezetimibe (ZETIA) 10 MG tablet Take 10 mg by mouth daily.   08/17/2014 at am  . glimepiride (AMARYL) 2 MG tablet Take 2 mg by mouth daily with breakfast.   08/17/2014 at am  . levothyroxine (SYNTHROID, LEVOTHROID) 50 MCG tablet Take 50 mcg by mouth daily.   08/17/2014 at am  . losartan (COZAAR) 100 MG tablet Take 100 mg by mouth daily.   08/17/2014 at am  . metFORMIN (GLUMETZA) 500 MG (MOD) 24 hr tablet Take 500 mg by mouth daily with breakfast.   08/17/2014 at am  . tamoxifen (NOLVADEX) 20 MG tablet Take 1 tablet (20 mg total) by mouth daily. 90 tablet 0 08/17/2014 at am  . traMADol (ULTRAM) 50 MG tablet Take 100 mg by  mouth 2 (two) times daily.   08/17/2014 at am  . oxyCODONE-acetaminophen (ROXICET) 5-325 MG per tablet Take 1 tablet by mouth every 6 (six) hours as needed. (Patient not taking: Reported on 08/17/2014) 20 tablet 0 Not Taking at Unknown time  . predniSONE (DELTASONE) 10 MG tablet Take 1 tablet (10 mg total) by mouth daily. Take 6 tablets tomorrow, then decrease by one tablet daily until gone. (Patient not taking: Reported on 08/17/2014) 21 tablet 0 Not Taking at Unknown time   History   Social History  . Marital Status: Widowed    Spouse Name: N/A  . Number of Children: N/A  . Years of Education: N/A   Occupational History  . Not on file.   Social History Main Topics  . Smoking status: Never Smoker   . Smokeless tobacco: Not on file  . Alcohol Use: No  . Drug Use: Not on file  . Sexual Activity: Not on file   Other Topics Concern  . Not on file   Social History Narrative    History reviewed. No pertinent family history.    Review of systems complete and found to be negative unless listed above      PHYSICAL EXAM  General: Well developed, well nourished, in no acute distress HEENT:  Normocephalic and atramatic Neck:  No JVD.  Lungs: Clear bilaterally to auscultation and percussion. Heart: HRRR . Normal S1 and S2 without gallops or murmurs.  Abdomen: Bowel sounds are  positive, abdomen soft and non-tender  Msk:  Back normal, normal gait. Normal strength and tone for age. Extremities: No clubbing, cyanosis or edema.   Neuro: Alert and oriented X 3. Psych:  Good affect, responds appropriately  Labs:   Lab Results  Component Value Date   WBC 14.8* 08/17/2014   HGB 12.3 08/17/2014   HCT 36.6 08/17/2014   MCV 93.4 08/17/2014   PLT 164 08/17/2014    Recent Labs Lab 08/17/14 0903  NA 137  K 3.2*  CL 99*  CO2 27  BUN 19  CREATININE 0.89  CALCIUM 8.7*  GLUCOSE 170*   Lab Results  Component Value Date   CKTOTAL 3333 RESULTS CONFIRMED BY MANUAL DILUTION*  09/10/2009   CKMB 1.4 10/14/2008   TROPONINI 0.22* 08/17/2014   No results found for: CHOL No results found for: HDL No results found for: LDLCALC No results found for: TRIG No results found for: CHOLHDL No results found for: LDLDIRECT    Radiology: Dg Chest Portable 1 View  08/17/2014   CLINICAL DATA:  Fall today  EXAM: PORTABLE CHEST - 1 VIEW  COMPARISON:  07/30/2009  FINDINGS: The heart is moderately enlarged. Lungs are clear. No pneumothorax. No rib fracture. No pleural effusion. Normal vascularity.  IMPRESSION: Cardiomegaly without decompensation.   Electronically Signed   By: Marybelle Killings M.D.   On: 08/17/2014 09:29   Dg Hip Unilat With Pelvis 2-3 Views Left  08/17/2014   CLINICAL DATA:  Hip pain.  Recent fall.  EXAM: DG HIP (WITH OR WITHOUT PELVIS) 2-3V LEFT  COMPARISON:  CT 07/16/2014  FINDINGS: Severe degenerative changes of the lumbar spine and both hips noted. Diffuse osteopenia. Fractures of the left superior inferior pubic rami are noted. These are slightly displaced. Aortoiliac atherosclerotic vascular disease. Pelvic calcifications consistent with phleboliths.  IMPRESSION: 1. Slightly displaced fractures of the left superior and inferior pubic rami. 2. Severe lumbar spine and bilateral hip degenerative change. Diffuse osteopenia . 3. Aortoiliac atherosclerotic vascular disease.   Electronically Signed   By: Marcello Moores  Register   On: 08/17/2014 09:24    EKG: Sinus rhythm  ASSESSMENT AND PLAN:   81 year old female who presents to [facility] emergency room after falling with resultant superior and inferior pubic ramus fracture, without loss of consciousness. She has palpitations that telemetry reveals predominant normal sinus rhythm. Patient has borderline elevated troponin in the absence of chest pain or ECG changes. His is likely due to an supply ischemia and not due to acute coronary syndrome.  Recommendations  1. Continue current medication 2. Defer full dose anticoagulation 3.  Continue to closely observe on telemetry to rule out arrhythmias 4. Review 2-D echocardiogram 5. No further cardiac diagnostics at this time   Signed: Anetra Czerwinski MD,PhD, [facility] 08/17/2014, 5:29 PM

## 2014-08-17 NOTE — Progress Notes (Signed)
Patient admitted to unit. Oriented to room, call bell, and staff. Bed in lowest position. Fall safety plan reviewed. Full assessment to Epic. Will continue to monitor. Skin verified with Izora Gala RN. No complaints at this time. Madison Fry

## 2014-08-18 LAB — GLUCOSE, CAPILLARY
GLUCOSE-CAPILLARY: 162 mg/dL — AB (ref 65–99)
Glucose-Capillary: 151 mg/dL — ABNORMAL HIGH (ref 65–99)
Glucose-Capillary: 160 mg/dL — ABNORMAL HIGH (ref 65–99)
Glucose-Capillary: 185 mg/dL — ABNORMAL HIGH (ref 65–99)

## 2014-08-18 LAB — LIPID PANEL
CHOLESTEROL: 138 mg/dL (ref 0–200)
HDL: 42 mg/dL (ref >40)
LDL Cholesterol: 69 mg/dL (ref 0–99)
Total CHOL/HDL Ratio: 3.3 RATIO
Triglycerides: 137 mg/dL (ref <150)
VLDL: 27 mg/dL (ref 0–40)

## 2014-08-18 LAB — COMPREHENSIVE METABOLIC PANEL
ALT: 10 U/L — AB (ref 14–54)
ANION GAP: 8 (ref 5–15)
AST: 15 U/L (ref 15–41)
Albumin: 3 g/dL — ABNORMAL LOW (ref 3.5–5.0)
Alkaline Phosphatase: 45 U/L (ref 38–126)
BILIRUBIN TOTAL: 1.4 mg/dL — AB (ref 0.3–1.2)
BUN: 17 mg/dL (ref 6–20)
CALCIUM: 7.9 mg/dL — AB (ref 8.9–10.3)
CO2: 24 mmol/L (ref 22–32)
Chloride: 101 mmol/L (ref 101–111)
Creatinine, Ser: 0.98 mg/dL (ref 0.44–1.00)
GFR calc non Af Amer: 52 mL/min — ABNORMAL LOW (ref >60)
GLUCOSE: 164 mg/dL — AB (ref 65–99)
Potassium: 3.5 mmol/L (ref 3.5–5.1)
Sodium: 133 mmol/L — ABNORMAL LOW (ref 135–145)
TOTAL PROTEIN: 5.7 g/dL — AB (ref 6.5–8.1)

## 2014-08-18 LAB — CBC
HEMATOCRIT: 33.7 % — AB (ref 35.0–47.0)
Hemoglobin: 11.2 g/dL — ABNORMAL LOW (ref 12.0–16.0)
MCH: 31.1 pg (ref 26.0–34.0)
MCHC: 33.4 g/dL (ref 32.0–36.0)
MCV: 93.1 fL (ref 80.0–100.0)
PLATELETS: 147 10*3/uL — AB (ref 150–440)
RBC: 3.62 MIL/uL — AB (ref 3.80–5.20)
RDW: 13.9 % (ref 11.5–14.5)
WBC: 14.6 10*3/uL — AB (ref 3.6–11.0)

## 2014-08-18 LAB — TSH: TSH: 2.529 u[IU]/mL (ref 0.350–4.500)

## 2014-08-18 LAB — PROTIME-INR
INR: 1.06
Prothrombin Time: 14 seconds (ref 11.4–15.0)

## 2014-08-18 LAB — TROPONIN I: Troponin I: 0.31 ng/mL — ABNORMAL HIGH (ref <0.031)

## 2014-08-18 MED ORDER — ENOXAPARIN SODIUM 40 MG/0.4ML ~~LOC~~ SOLN
40.0000 mg | SUBCUTANEOUS | Status: DC
  Administered 2014-08-18 – 2014-08-19 (×2): 40 mg via SUBCUTANEOUS
  Filled 2014-08-18 (×2): qty 0.4

## 2014-08-18 MED ORDER — METFORMIN HCL ER 500 MG PO TB24
500.0000 mg | ORAL_TABLET | Freq: Every day | ORAL | Status: DC
  Administered 2014-08-18 – 2014-08-20 (×3): 500 mg via ORAL
  Filled 2014-08-18 (×3): qty 1

## 2014-08-18 NOTE — Progress Notes (Signed)
[place] at [employer] NAME: Madison Fry    MR#:  [id number]  DATE OF BIRTH:  1928-08-28  SUBJECTIVE: 81 year old female admitted for near-syncope. Found to have UTI, superior and inferior pubic fracture. Patient denies chest pain, shortness of breath, nausea, vomiting. She feels better today.   CHIEF COMPLAINT:   Chief Complaint  Patient presents with  . Fall  . Hip Pain    REVIEW OF SYSTEMS:   ROS CONSTITUTIONAL: No fever, fatigue or weakness.  EYES: No blurred or double vision.  EARS, NOSE, AND THROAT: No tinnitus or ear pain.  RESPIRATORY: No cough, shortness of breath, wheezing or hemoptysis.  CARDIOVASCULAR: No chest pain, orthopnea, edema.  GASTROINTESTINAL: No nausea, vomiting, diarrhea or abdominal pain.  GENITOURINARY: No dysuria, hematuria.  ENDOCRINE: No polyuria, nocturia,  HEMATOLOGY: No anemia, easy bruising or bleeding SKIN: No rash or lesion. MUSCULOSKELETAL: Pelvic pain. Especially on the left side. NEUROLOGIC: No tingling, numbness, weakness.  PSYCHIATRY: No anxiety or depression.   DRUG ALLERGIES:   Allergies  Allergen Reactions  . Shellfish Allergy Swelling    VITALS:  Blood pressure 122/56, pulse 95, temperature 98.8 F (37.1 C), temperature source Oral, resp. rate 18, height '5\' 5"'$  (1.651 m), weight 80.241 kg (176 lb 14.4 oz), SpO2 96 %.  PHYSICAL EXAMINATION:  GENERAL:  81 y.o.-year-old patient lying in the bed with no acute distress.  EYES: Pupils equal, round, reactive to light and accommodation. No scleral icterus. Extraocular muscles intact.  HEENT: Head atraumatic, normocephalic. Oropharynx and nasopharynx clear.  NECK:  Supple, no jugular venous distention. No thyroid enlargement, no tenderness.  LUNGS: Normal breath sounds bilaterally, no wheezing, rales,rhonchi or crepitation. No use of accessory muscles of respiration.  CARDIOVASCULAR: S1, S2 normal. No murmurs, rubs, or gallops.   ABDOMEN: Soft, nontender, nondistended. Bowel sounds present. No organomegaly or mass.  EXTREMITIES: No pedal edema, cyanosis, or clubbing.  NEUROLOGIC: Cranial nerves II through XII are intact. Muscle strength 5/5 in all extremities. Sensation intact. Gait not checked.  PSYCHIATRIC: The patient is alert and oriented x 3.  SKIN: No obvious rash, lesion, or ulcer.    LABORATORY PANEL:   CBC  Recent Labs Lab 08/18/14 0022  WBC 14.6*  HGB 11.2*  HCT 33.7*  PLT 147*   ------------------------------------------------------------------------------------------------------------------  Chemistries   Recent Labs Lab 08/18/14 0022  NA 133*  K 3.5  CL 101  CO2 24  GLUCOSE 164*  BUN 17  CREATININE 0.98  CALCIUM 7.9*  AST 15  ALT 10*  ALKPHOS 45  BILITOT 1.4*   ------------------------------------------------------------------------------------------------------------------  Cardiac Enzymes  Recent Labs Lab 08/18/14 0022  TROPONINI 0.31*   ------------------------------------------------------------------------------------------------------------------  RADIOLOGY:  Dg Chest Portable 1 View  08/17/2014   CLINICAL DATA:  Fall today  EXAM: PORTABLE CHEST - 1 VIEW  COMPARISON:  07/30/2009  FINDINGS: The heart is moderately enlarged. Lungs are clear. No pneumothorax. No rib fracture. No pleural effusion. Normal vascularity.  IMPRESSION: Cardiomegaly without decompensation.   Electronically Signed   By: Marybelle Killings M.D.   On: 08/17/2014 09:29   Dg Hip Unilat With Pelvis 2-3 Views Left  08/17/2014   CLINICAL DATA:  Hip pain.  Recent fall.  EXAM: DG HIP (WITH OR WITHOUT PELVIS) 2-3V LEFT  COMPARISON:  CT 07/16/2014  FINDINGS: Severe degenerative changes of the lumbar spine and both hips noted. Diffuse osteopenia. Fractures of the left superior inferior pubic rami are noted. These are slightly displaced. Aortoiliac atherosclerotic vascular disease. Pelvic  calcifications consistent  with phleboliths.  IMPRESSION: 1. Slightly displaced fractures of the left superior and inferior pubic rami. 2. Severe lumbar spine and bilateral hip degenerative change. Diffuse osteopenia . 3. Aortoiliac atherosclerotic vascular disease.   Electronically Signed   By: Marcello Moores  Register   On: 08/17/2014 09:24    EKG:   Orders placed or performed during the hospital encounter of 08/17/14  . EKG 12-Lead  . EKG 12-Lead    ASSESSMENT AND PLAN:   #1: UTI: Patient feels better received IV fluids, continue anti-biotics for UTI, check urine cultures. Patient has no further arrhythmias. #2:  near-syncope elevated troponins likely secondary to UTI) rather than acute coronary syndrome. Echo showed EF more than 50%. Appreciate cardiology input. #3: Cystitis with UTI continue IV antibiotics.  #4; left hip pain secondary to left superior inferior ramus fractures ;check physical therapy evaluation.. 5. hypertension controlled with atenolol, Cozaar Diabetes mellitus type 2 continue insulin, her home medications.  All the records are reviewed and case discussed with Care Management/Social Workerr. Management plans discussed with the patient, family and they are in agreement.  CODE STATUS: full  TOTAL TIME TAKING CARE OF THIS PATIENT: 35  minutes.   POSSIBLE D/C IN 1-2DAYS, DEPENDING ON CLINICAL CONDITION.   Epifanio Lesches M.D on 08/18/2014 at 8:50 AM  Between 7am to 6pm - Pager - [phone]  After 6pm go to [email or web address] - password [email or web address] [facility]  Office  [phone]  CC: Primary care physician; Mayra Neer, MD

## 2014-08-18 NOTE — Progress Notes (Signed)
[facility] Cardiology  SUBJECTIVE: I don't have chest pain   Filed Vitals:   08/17/14 1202 08/17/14 1205 08/17/14 2002 08/18/14 0455  BP: 133/60 119/60 110/45 122/56  Pulse: 76 72 99 95  Temp: 98.3 F (36.8 C)  99.7 F (37.6 C) 98.8 F (37.1 C)  TempSrc: Oral  Oral Oral  Resp: '18  20 18  '$ Height: '5\' 5"'$  (1.651 m)     Weight: 80.241 kg (176 lb 14.4 oz)     SpO2: 96%  94% 96%     Intake/Output Summary (Last 24 hours) at 08/18/14 1105 Last data filed at 08/18/14 8242  Gross per 24 hour  Intake  897.5 ml  Output   1075 ml  Net -177.5 ml      PHYSICAL EXAM  General: Well developed, well nourished, in no acute distress HEENT:  Normocephalic and atramatic Neck:  No JVD.  Lungs: Clear bilaterally to auscultation and percussion. Heart: HRRR . Normal S1 and S2 without gallops or murmurs.  Abdomen: Bowel sounds are positive, abdomen soft and non-tender  Msk:  Back normal, normal gait. Normal strength and tone for age. Extremities: No clubbing, cyanosis or edema.   Neuro: Alert and oriented X 3. Psych:  Good affect, responds appropriately   LABS: Basic Metabolic Panel:  Recent Labs  08/17/14 0903 08/18/14 0022  NA 137 133*  K 3.2* 3.5  CL 99* 101  CO2 27 24  GLUCOSE 170* 164*  BUN 19 17  CREATININE 0.89 0.98  CALCIUM 8.7* 7.9*   Liver Function Tests:  Recent Labs  08/18/14 0022  AST 15  ALT 10*  ALKPHOS 45  BILITOT 1.4*  PROT 5.7*  ALBUMIN 3.0*   No results for input(s): LIPASE, AMYLASE in the last 72 hours. CBC:  Recent Labs  08/17/14 0903 08/18/14 0022  WBC 14.8* 14.6*  NEUTROABS 13.6*  --   HGB 12.3 11.2*  HCT 36.6 33.7*  MCV 93.4 93.1  PLT 164 147*   Cardiac Enzymes:  Recent Labs  08/17/14 1348 08/17/14 1830 08/18/14 0022  TROPONINI 0.17* 0.17* 0.31*   BNP: Invalid input(s): POCBNP D-Dimer: No results for input(s): DDIMER in the last 72 hours. Hemoglobin A1C: No results for input(s): HGBA1C in the last 72 hours. Fasting Lipid  Panel:  Recent Labs  08/18/14 0022  CHOL 138  HDL 42  LDLCALC 69  TRIG 137  CHOLHDL 3.3   Thyroid Function Tests:  Recent Labs  08/18/14 0022  TSH 2.529   Anemia Panel: No results for input(s): VITAMINB12, FOLATE, FERRITIN, TIBC, IRON, RETICCTPCT in the last 72 hours.  Dg Chest Portable 1 View  08/17/2014   CLINICAL DATA:  Fall today  EXAM: PORTABLE CHEST - 1 VIEW  COMPARISON:  07/30/2009  FINDINGS: The heart is moderately enlarged. Lungs are clear. No pneumothorax. No rib fracture. No pleural effusion. Normal vascularity.  IMPRESSION: Cardiomegaly without decompensation.   Electronically Signed   By: Marybelle Killings M.D.   On: 08/17/2014 09:29   Dg Hip Unilat With Pelvis 2-3 Views Left  08/17/2014   CLINICAL DATA:  Hip pain.  Recent fall.  EXAM: DG HIP (WITH OR WITHOUT PELVIS) 2-3V LEFT  COMPARISON:  CT 07/16/2014  FINDINGS: Severe degenerative changes of the lumbar spine and both hips noted. Diffuse osteopenia. Fractures of the left superior inferior pubic rami are noted. These are slightly displaced. Aortoiliac atherosclerotic vascular disease. Pelvic calcifications consistent with phleboliths.  IMPRESSION: 1. Slightly displaced fractures of the left superior and inferior pubic rami. 2.  Severe lumbar spine and bilateral hip degenerative change. Diffuse osteopenia . 3. Aortoiliac atherosclerotic vascular disease.   Electronically Signed   By: Marcello Moores  Register   On: 08/17/2014 09:24     Echo: normal left ventricular function, LVEF 55-65%  TELEMETRY: Normal sinus rhythm:  ASSESSMENT AND PLAN:  Active Problems:   Hip pain, acute   Elevated troponin    81 year old female presents with superior and inferior pubic ramus fracture, with borderline elevated troponin, in the absence of chest pain or ECG changes, likely due to demand supply ischemia, and not due to acute coronary syndrome.  Recommendations  1. Continue current medications 2. Defer full dose anticoagulation 3. Defer  further cardiac diagnostics at this time  Signed off for now, please call if any questions   Ryann Pauli, MD, PhD, [facility] 08/18/2014 11:05 AM

## 2014-08-18 NOTE — Progress Notes (Signed)
Anticoagulation monitoring  81 yo female currently ordered enoxaparin 30 mg SQ daily  Ht 65", Wt 80.2 kg Body mass index is 29.44 kg/(m^2). SCr 0.98, CrCl 45.5 ml/min  Based on BMI<40 and CrCl >30 ml/min, will adjust to enoxaparin 40 mg SQ daily per hospital policy.   Rayna Sexton, PharmD, BCPS Clinical Pharmacist  08/18/2014 12:36 PM

## 2014-08-18 NOTE — Progress Notes (Signed)
Per MD Vianne Bulls OK to order physical therapy

## 2014-08-19 DIAGNOSIS — N390 Urinary tract infection, site not specified: Secondary | ICD-10-CM | POA: Diagnosis present

## 2014-08-19 LAB — GLUCOSE, CAPILLARY
Glucose-Capillary: 147 mg/dL — ABNORMAL HIGH (ref 65–99)
Glucose-Capillary: 120 mg/dL — ABNORMAL HIGH (ref 65–99)
Glucose-Capillary: 148 mg/dL — ABNORMAL HIGH (ref 65–99)
Glucose-Capillary: 131 mg/dL — ABNORMAL HIGH (ref 65–99)

## 2014-08-19 LAB — CBC
HCT: 31.7 % — ABNORMAL LOW (ref 35.0–47.0)
Hemoglobin: 10.8 g/dL — ABNORMAL LOW (ref 12.0–16.0)
MCH: 32.2 pg (ref 26.0–34.0)
MCHC: 34 g/dL (ref 32.0–36.0)
MCV: 94.5 fL (ref 80.0–100.0)
PLATELETS: 129 10*3/uL — AB (ref 150–440)
RBC: 3.36 MIL/uL — ABNORMAL LOW (ref 3.80–5.20)
RDW: 14 % (ref 11.5–14.5)
WBC: 10.2 10*3/uL (ref 3.6–11.0)

## 2014-08-19 LAB — URINE CULTURE: Culture: >=100000

## 2014-08-19 MED ORDER — SODIUM CHLORIDE 0.9 % IV BOLUS (SEPSIS)
1000.0000 mL | Freq: Once | INTRAVENOUS | Status: AC
  Administered 2014-08-19: 1000 mL via INTRAVENOUS

## 2014-08-19 NOTE — Progress Notes (Signed)
Notified Dr. Reece Levy of troponin .31. No new orders, will pass on to dayshift.

## 2014-08-19 NOTE — Progress Notes (Signed)
[facility] [place] NAME: Othell Diluzio    MR#:  [id number]  DATE OF BIRTH:  10/08/1928  SUBJECTIVE: 81 year old female admitted for near-syncope. Found to have UTI, superior and inferior pubic fracture. Patient denies chest pain, shortness of breath, nausea, vomiting. Does have hypotension but asymptomatic.   CHIEF COMPLAINT:   Chief Complaint  Patient presents with  . Fall  . Hip Pain    REVIEW OF SYSTEMS:   ROS CONSTITUTIONAL: No fever, fatigue or weakness.  EYES: No blurred or double vision.  EARS, NOSE, AND THROAT: No tinnitus or ear pain.  RESPIRATORY: No cough, shortness of breath, wheezing or hemoptysis.  CARDIOVASCULAR: No chest pain, orthopnea, edema.  GASTROINTESTINAL: No nausea, vomiting, diarrhea or abdominal pain.  GENITOURINARY: No dysuria, hematuria.  ENDOCRINE: No polyuria, nocturia,  HEMATOLOGY: No anemia, easy bruising or bleeding SKIN: No rash or lesion. MUSCULOSKELETAL: Pelvic pain. Especially on the left side. NEUROLOGIC: No tingling, numbness, weakness.  PSYCHIATRY: No anxiety or depression.   DRUG ALLERGIES:   Allergies  Allergen Reactions  . Shellfish Allergy Swelling    VITALS:  Blood pressure 93/40, pulse 78, temperature 98.1 F (36.7 C), temperature source Oral, resp. rate 22, height '5\' 5"'$  (1.651 m), weight 80.241 kg (176 lb 14.4 oz), SpO2 90 %.  PHYSICAL EXAMINATION:  GENERAL:  81 y.o.-year-old patient lying in the bed with no acute distress.  EYES: Pupils equal, round, reactive to light and accommodation. No scleral icterus. Extraocular muscles intact.  HEENT: Head atraumatic, normocephalic. Oropharynx and nasopharynx clear.  NECK:  Supple, no jugular venous distention. No thyroid enlargement, no tenderness.  LUNGS: Normal breath sounds bilaterally, no wheezing, rales,rhonchi or crepitation. No use of accessory muscles of respiration.  CARDIOVASCULAR: S1, S2 normal. No murmurs, rubs, or  gallops.  ABDOMEN: Soft, nontender, nondistended. Bowel sounds present. No organomegaly or mass.  EXTREMITIES: No pedal edema, cyanosis, or clubbing.  NEUROLOGIC: Cranial nerves II through XII are intact. Muscle strength 5/5 in all extremities. Sensation intact. Gait not checked.  PSYCHIATRIC: The patient is alert and oriented x 3.  SKIN: No obvious rash, lesion, or ulcer.    LABORATORY PANEL:   CBC  Recent Labs Lab 08/19/14 0512  WBC 10.2  HGB 10.8*  HCT 31.7*  PLT 129*   ------------------------------------------------------------------------------------------------------------------  Chemistries   Recent Labs Lab 08/18/14 0022  NA 133*  K 3.5  CL 101  CO2 24  GLUCOSE 164*  BUN 17  CREATININE 0.98  CALCIUM 7.9*  AST 15  ALT 10*  ALKPHOS 45  BILITOT 1.4*   ------------------------------------------------------------------------------------------------------------------  Cardiac Enzymes  Recent Labs Lab 08/18/14 0022  TROPONINI 0.31*   ------------------------------------------------------------------------------------------------------------------  RADIOLOGY:  No results found.  EKG:   Orders placed or performed during the hospital encounter of 08/17/14  . EKG 12-Lead  . EKG 12-Lead    ASSESSMENT AND PLAN:   #1: UTI: Patient feels better received IV fluids, continue anti-biotics for UTI, ch urine cultures showed gram-negative rods final cultures are pending continue IV Rocephin until then.  #2:  near-syncope elevated troponins likely secondary to UTI) rather than acute coronary syndrome. Echo showed EF more than 50%. Appreciate cardiology input.   #4; left hip pain secondary to left superior inferior ramus fractures ;check physical therapy evaluation.. 5. hypertension ; hypotensive today but asymptomatic. Continue IV fluids for BP medications. Patient is asymptomatic so she can participate in physical therapy. Diabetes mellitus type 2 continue  insulin, her home medications.  All  the records are reviewed and case discussed with Care Management/Social Workerr. Management plans discussed with the patient, family and they are in agreement.  CODE STATUS: full  TOTAL TIME TAKING CARE OF THIS PATIENT: 35  minutes.   POSSIBLE D/C IN 1-2DAYS, DEPENDING ON CLINICAL CONDITION.   Epifanio Lesches M.D on 08/19/2014 at 10:07 AM  Between 7am to 6pm - Pager - [phone]  After 6pm go to [email or web address] - password [email or web address] [place] [facility]  Office  [phone]  CC: Primary care physician; Mayra Neer, MD

## 2014-08-19 NOTE — Evaluation (Signed)
Physical Therapy Evaluation Patient Details Name: Madison Fry MRN: [id number] DOB: 1928-12-05 Today's Date: 08/19/2014   History of Present Illness  81 yo who fell at home, had elevated troponin from UTI and admitted for near-syncope. Found to have superior and inferior pubic fracture.  Clinical Impression  Pt was noted to walk fairly well and has been needing assistance mainly for the lower surfaces.  Her plan is to go home with assistance from family with HHPT to strengthen her for lower surface standing.      Follow Up Recommendations Home health PT;Supervision/Assistance - 24 hour    Equipment Recommendations  None recommended by PT    Recommendations for Other Services       Precautions / Restrictions Precautions Precautions: Fall Restrictions Weight Bearing Restrictions: Yes LLE Weight Bearing: Weight bearing as tolerated      Mobility  Bed Mobility Overal bed mobility: Needs Assistance Bed Mobility: Supine to Sit     Supine to sit: Min assist     General bed mobility comments: mainly help under her trunk and minor help to finish scooting out to EOB  Transfers Overall transfer level: Needs assistance Equipment used: Rolling walker (2 wheeled);1 person hand held assist Transfers: Sit to/from [facility] Sit to Stand: Mod assist;Min assist (min higher and mod lower surfaces) Stand pivot transfers: Min assist;Min guard       General transfer comment: mainly an issue to stand up from std height commode  Ambulation/Gait Ambulation/Gait assistance: Min guard;Min assist Ambulation Distance (Feet): 40 Feet Assistive device: Rolling walker (2 wheeled);1 person hand held assist Gait Pattern/deviations: Step-to pattern;Decreased stride length;Decreased stance time - right;Wide base of support;Antalgic Gait velocity: slow Gait velocity interpretation: Below normal speed for age/gender General Gait Details: assist to support as pt walks in pain on  RLE with help to use walker more effectively and keep closer  Stairs            Wheelchair Mobility    Modified Rankin (Stroke Patients Only)       Balance                                             Pertinent Vitals/Pain Pain Assessment: Faces Faces Pain Scale: Hurts little more Pain Intervention(s): Limited activity within patient's tolerance;Monitored during session;Premedicated before session;Repositioned    Home Living Family/patient expects to be discharged to:: Private residence Living Arrangements: Alone Available Help at Discharge: Family Type of Home: House Home Access: Ramped entrance     Home Layout: One level Home Equipment: Environmental consultant - 2 wheels;Walker - 4 wheels;Shower seat;Bedside commode;Wheelchair - power;Other (comment) (uses power chair to leave house on ramp)      Prior Function Level of Independence: Independent with assistive device(s)               Hand Dominance        Extremity/Trunk Assessment   Upper Extremity Assessment: Overall WFL for tasks assessed           Lower Extremity Assessment: RLE deficits/detail RLE Deficits / Details: pain with mobility due to pelvic fracture    Cervical / Trunk Assessment: Kyphotic  Communication   Communication: No difficulties  Cognition Arousal/Alertness: Awake/alert Behavior During Therapy: WFL for tasks assessed/performed Overall Cognitive Status: Within Functional Limits for tasks assessed       Memory: Decreased short-term memory;Decreased recall of precautions  General Comments General comments (skin integrity, edema, etc.): Pt has a poor tolerance for pain in R LE but then can stand with minor help from higher surfaces when more distracted.  Has help from her family at home and equipment to assist, and with HHPT should be successful.     Exercises        Assessment/Plan    PT Assessment Patient needs continued PT services  PT  Diagnosis Acute pain;Altered mental status   PT Problem List Decreased strength;Decreased range of motion;Decreased activity tolerance;Decreased mobility;Decreased balance;Decreased coordination;Decreased cognition;Decreased knowledge of use of DME;Decreased safety awareness;Decreased knowledge of precautions;Cardiopulmonary status limiting activity;Pain  PT Treatment Interventions DME instruction;Gait training;Functional mobility training;Therapeutic activities;Therapeutic exercise;Balance training;Neuromuscular re-education;Patient/family education   PT Goals (Current goals can be found in the Care Plan section) Acute Rehab PT Goals Patient Stated Goal: to go home ASAP PT Goal Formulation: With patient Time For Goal Achievement: 09/02/14 Potential to Achieve Goals: Good    Frequency 7X/week   Barriers to discharge   Has family there 24/7 per pt    Co-evaluation               End of Session Equipment Utilized During Treatment: Gait belt Activity Tolerance: Patient tolerated treatment well;Patient limited by pain Patient left: in chair;with call bell/phone within reach;with chair alarm set;with nursing/sitter in room Nurse Communication: Mobility status         Time: 1341-1409 PT Time Calculation (min) (ACUTE ONLY): 28 min   Charges:   PT Evaluation $Initial PT Evaluation Tier I: 1 Procedure PT Treatments $Gait Training: 8-22 mins   PT G CodesRamond Dial 2014-09-06, 3:29 PM    Mee Hives, PT MS Acute Rehab Dept. Number: [phone] [phone] and [place] [phone]

## 2014-08-19 NOTE — Progress Notes (Signed)
Per MD Vianne Bulls hold BP meds and give fluid bolus for low BP

## 2014-08-20 LAB — GLUCOSE, CAPILLARY
GLUCOSE-CAPILLARY: 118 mg/dL — AB (ref 65–99)
Glucose-Capillary: 164 mg/dL — ABNORMAL HIGH (ref 65–99)

## 2014-08-20 MED ORDER — OXYCODONE-ACETAMINOPHEN 5-325 MG PO TABS: 1.0000 | ORAL_TABLET | Freq: Four times a day (QID) | ORAL | Status: DC | PRN

## 2014-08-20 MED ORDER — LACTULOSE 10 GM/15ML PO SOLN
20.0000 g | Freq: Once | ORAL | Status: AC
  Administered 2014-08-20: 20 g via ORAL
  Filled 2014-08-20: qty 30

## 2014-08-20 MED ORDER — CEPHALEXIN 500 MG PO CAPS: 500.0000 mg | ORAL_CAPSULE | Freq: Two times a day (BID) | ORAL | Status: DC

## 2014-08-20 NOTE — Progress Notes (Signed)
Physical Therapy Treatment Patient Details Name: Madison Fry MRN: [id number] DOB: [birth year] Today's Date: 08/20/2014    History of Present Illness 81 yo who fell at home, had elevated troponin from UTI and admitted for near-syncope. Found to have superior and inferior pubic fracture.    PT Comments    Daughter has some concerns as to whether or not she will be able to handle pt at home alone. Pt does demonstrate ability to transfer out of bed and ambulate minimally. According to daughter pt primarily use power chair at home and is at baseline for transfers although is experiencing increased pain with stand and ambulation. Pt has discharge orders to home today with home health PT.   Follow Up Recommendations  Home health PT     Equipment Recommendations  None recommended by PT    Recommendations for Other Services       Precautions / Restrictions Precautions Precautions: Fall Restrictions Weight Bearing Restrictions: Yes LLE Weight Bearing: Weight bearing as tolerated    Mobility  Bed Mobility Overal bed mobility: Needs Assistance Bed Mobility: Supine to Sit;Sit to Supine     Supine to sit: Min assist Sit to supine: Modified independent (Device/Increase time)      Transfers Overall transfer level: Needs assistance Equipment used: Rolling walker (2 wheeled);1 person hand held assist Transfers: Sit to/from Stand Sit to Stand: Min guard (cues for hands)            Ambulation/Gait Ambulation/Gait assistance: Min guard Ambulation Distance (Feet): 25 Feet Assistive device: Rolling walker (2 wheeled);1 person hand held assist Gait Pattern/deviations: Step-to pattern;Trunk flexed (effortful) Gait velocity: slow Gait velocity interpretation: <1.8 ft/sec, indicative of risk for recurrent falls General Gait Details:  (cues to stay closer to rw)   Stairs Stairs:  (No stairs at home)          Wheelchair Mobility    Modified Rankin (Stroke Patients  Only)       Balance                                    Cognition Arousal/Alertness: Awake/alert Behavior During Therapy: WFL for tasks assessed/performed Overall Cognitive Status: Within Functional Limits for tasks assessed                      Exercises      General Comments        Pertinent Vitals/Pain Pain Assessment:  ("some" pain in R hip) Pain Intervention(s): Limited activity within patient's tolerance    Home Living                      Prior Function            PT Goals (current goals can now be found in the care plan section) Progress towards PT goals: Progressing toward goals    Frequency  7X/week    PT Plan Current plan remains appropriate    Co-evaluation             End of Session Equipment Utilized During Treatment: Gait belt Activity Tolerance: Patient limited by fatigue;Patient limited by pain Patient left: in bed;with call bell/phone within reach;with bed alarm set;with family/visitor present     Time: 1114-1130 PT Time Calculation (min) (ACUTE ONLY): 16 min  Charges:  $Gait Training: 8-22 mins  G Codes:      Charlaine Dalton 08/20/2014, 11:53 AM

## 2014-08-20 NOTE — Discharge Summary (Signed)
Madison Fry, is a 81 y.o. female  DOB Sep 07, 1928  MRN [id number].  Admission date:  08/17/2014  Admitting Physician  Nicholes Mango, MD  Discharge Date:  08/20/2014   Primary MD  Mayra Neer, MD  Recommendations for primary care physician for things to follow:   Follow-up with primary doctor in 1 week.   Admission Diagnosis  Elevated troponin [R79.89] Fracture of multiple pubic rami, right, closed, initial encounter [S32.501A]   Discharge Diagnosis  Elevated troponin [R79.89] Fracture of multiple pubic rami, right, closed, initial encounter [S32.501A]    Active Problems:   Hip pain, acute   Elevated troponin   UTI (lower urinary tract infection)      Past Medical History  Diagnosis Date  . Diabetes mellitus without complication   . Hypertension   . Cancer     breast cancer, Right side lymph nodes removed    History reviewed. No pertinent past surgical history.     History of present illness and  Hospital Course:     Kindly see H&P for history of present illness and admission details, please review complete Labs, Consult reports and Test reports for all details in brief  HPI  from the history and physical done on the day of admission  81 year old female patient with history of hypertension, type 2 diabetes mellitus came in because of the fall. Patient admitted to telemetry a cause of elevated troponins of 0.22. She'll did not have chest pain and she felt dizzy and fell. Hospital Course    #1: Near syncope secondary to dehydration and UTI: Patient started on IV fluids, IV antibiotics. Patient had echocardiogram,  The cardiogram showed EF of 55-60% with normal allergy systolic function. He is syncope thought to be secondary to UTI. Urine culture showed Escherichia coli sensitive to all the antibodies.  Patient started on Rocephin and discharged home with Keflex. 2.left hip pain secondary to left superior, inferior.  Pubic ramus fracture;;by physical therapy recommended home physical therapy. Continue pain medication give prescription for Roxicet. 3 hypertension patient has been having hypotension episodes here. we gave her fluid boluses but patient is asymptomatic I have decreased the Coreg that dose. 4.Type 2 diabetes mellitus continue insulin, home medications.  Discharge Condition:    Follow UP   follow up  With primary  doctor in 1 week.   Discharge Instructions  and  Discharge Medications        Medication List    STOP taking these medications        predniSONE 10 MG tablet  Commonly known as:  DELTASONE      TAKE these medications        atenolol-chlorthalidone 50-25 MG per tablet  Commonly known as:  TENORETIC  Take 2 tablets by mouth daily.     cephALEXin 500 MG capsule  Commonly known as:  KEFLEX  Take 1 capsule (500 mg total) by mouth 2 (two) times daily.     colesevelam 625 MG tablet  Commonly known as:  WELCHOL  Take by mouth 2 (two) times daily with a meal.     ezetimibe 10 MG tablet  Commonly known as:  ZETIA  Take 10 mg by mouth daily.     glimepiride 2 MG tablet  Commonly known as:  AMARYL  Take 2 mg by mouth daily with breakfast.     levothyroxine 50 MCG tablet  Commonly known as:  SYNTHROID, LEVOTHROID  Take 50 mcg by mouth daily.     losartan 100 MG  tablet  Commonly known as:  COZAAR  Take 100 mg by mouth daily.     metFORMIN 500 MG (MOD) 24 hr tablet  Commonly known as:  GLUMETZA  Take 500 mg by mouth daily with breakfast.     oxyCODONE-acetaminophen 5-325 MG per tablet  Commonly known as:  ROXICET  Take 1 tablet by mouth every 6 (six) hours as needed.     tamoxifen 20 MG tablet  Commonly known as:  NOLVADEX  Take 1 tablet (20 mg total) by mouth daily.     traMADol 50 MG tablet  Commonly known as:  ULTRAM  Take 100 mg by  mouth 2 (two) times daily.          Diet and Activity recommendation: See Discharge Instructions above   Consults obtained -physical therapy   Major procedures and Radiology Reports - PLEASE review detailed and final reports for all details, in brief -      Dg Chest Portable 1 View  08/17/2014   CLINICAL DATA:  Fall today  EXAM: PORTABLE CHEST - 1 VIEW  COMPARISON:  07/30/2009  FINDINGS: The heart is moderately enlarged. Lungs are clear. No pneumothorax. No rib fracture. No pleural effusion. Normal vascularity.  IMPRESSION: Cardiomegaly without decompensation.   Electronically Signed   By: Marybelle Killings M.D.   On: 08/17/2014 09:29   Dg Hip Unilat With Pelvis 2-3 Views Left  08/17/2014   CLINICAL DATA:  Hip pain.  Recent fall.  EXAM: DG HIP (WITH OR WITHOUT PELVIS) 2-3V LEFT  COMPARISON:  CT 07/16/2014  FINDINGS: Severe degenerative changes of the lumbar spine and both hips noted. Diffuse osteopenia. Fractures of the left superior inferior pubic rami are noted. These are slightly displaced. Aortoiliac atherosclerotic vascular disease. Pelvic calcifications consistent with phleboliths.  IMPRESSION: 1. Slightly displaced fractures of the left superior and inferior pubic rami. 2. Severe lumbar spine and bilateral hip degenerative change. Diffuse osteopenia . 3. Aortoiliac atherosclerotic vascular disease.   Electronically Signed   By: Marcello Moores  Register   On: 08/17/2014 09:24    Micro Results     Recent Results (from the past 240 hour(s))  Urine culture     Status: None   Collection Time: 08/17/14  9:03 AM  Result Value Ref Range Status   Specimen Description URINE, RANDOM  Final   Special Requests NONE  Final   Culture >=100,000 COLONIES/mL ESCHERICHIA COLI  Final   Report Status 08/19/2014 FINAL  Final   Organism ID, Bacteria ESCHERICHIA COLI  Final      Susceptibility   Escherichia coli - MIC*    AMPICILLIN 8 SENSITIVE Sensitive     CEFTAZIDIME <=1 SENSITIVE Sensitive      CEFAZOLIN <=4 SENSITIVE Sensitive     CEFTRIAXONE <=1 SENSITIVE Sensitive     CIPROFLOXACIN <=0.25 SENSITIVE Sensitive     GENTAMICIN <=1 SENSITIVE Sensitive     IMIPENEM <=0.25 SENSITIVE Sensitive     TRIMETH/SULFA <=20 SENSITIVE Sensitive     * >=100,000 COLONIES/mL ESCHERICHIA COLI       Today   Subjective:   Brinkley Peet today has no headache,no chest abdominal pain,no new weakness tingling or numbness, feels much better wants to go home today.   Objective:   Blood pressure 110/52, pulse 70, temperature 98.3 F (36.8 C), temperature source Oral, resp. rate 18, height '5\' 5"'$  (1.651 m), weight 80.241 kg (176 lb 14.4 oz), SpO2 92 %.   Intake/Output Summary (Last 24 hours) at 08/20/14 1258 Last data filed at  08/20/14 1046  Gross per 24 hour  Intake    720 ml  Output   2350 ml  Net  -1630 ml    Exam Awake Alert, Oriented x 3, No new F.N deficits, Normal affect [place].AT,PERRAL Supple Neck,No JVD, No cervical lymphadenopathy appriciated.  Symmetrical Chest wall movement, Good air movement bilaterally, CTAB RRR,No Gallops,Rubs or new Murmurs, No Parasternal Heave +ve B.Sounds, Abd Soft, Non tender, No organomegaly appriciated, No rebound -guarding or rigidity. No Cyanosis, Clubbing or edema, No new Rash or bruise  Data Review   CBC w Diff: Lab Results  Component Value Date   WBC 10.2 08/19/2014   WBC 8.0 05/07/2011   WBC 8.2 by slide est 09/04/2009   HGB 10.8* 08/19/2014   HGB 13.2 05/07/2011   HGB 13.5 09/04/2009   HCT 31.7* 08/19/2014   HCT 38.9 05/07/2011   HCT 39.9 09/04/2009   PLT 129* 08/19/2014   PLT 255 05/07/2011   PLT 266 09/04/2009   LYMPHOPCT 5 08/17/2014   LYMPHOPCT 16.9 05/07/2011   MONOPCT 3 08/17/2014   MONOPCT 5.9 05/07/2011   EOSPCT 0 08/17/2014   EOSPCT 1.8 05/07/2011   BASOPCT 0 08/17/2014   BASOPCT 0.4 05/07/2011    CMP: Lab Results  Component Value Date   NA 133* 08/18/2014   NA 141 05/07/2011   NA 146* 09/04/2009   K 3.5  08/18/2014   K 3.5 05/07/2011   K 4.0 09/04/2009   CL 101 08/18/2014   CL 103 05/07/2011   CL 102 09/04/2009   CO2 24 08/18/2014   CO2 28 05/07/2011   CO2 29 09/04/2009   BUN 17 08/18/2014   BUN 16 05/07/2011   BUN 11 09/04/2009   CREATININE 0.98 08/18/2014   CREATININE 1.03 05/07/2011   CREATININE 0.8 09/04/2009   PROT 5.7* 08/18/2014   PROT 7.5 05/07/2011   PROT 7.3 09/04/2009   ALBUMIN 3.0* 08/18/2014   ALBUMIN 3.6 05/07/2011   BILITOT 1.4* 08/18/2014   BILITOT 0.5 05/07/2011   BILITOT 1.20 09/04/2009   ALKPHOS 45 08/18/2014   ALKPHOS 88 05/07/2011   ALKPHOS 72 09/04/2009   AST 15 08/18/2014   AST 16 05/07/2011   AST 16 09/04/2009   ALT 10* 08/18/2014   ALT 18 05/07/2011   ALT 14 09/04/2009  .   Total Time in preparing paper work, data evaluation and todays exam - 77 minutes  Abel Ra M.D on 08/20/2014 at 12:58 PM

## 2014-08-20 NOTE — Care Management (Signed)
Discussed  During progression that CM would need to see this patient prior to discharge to set up home health services.  Patient was discharged without CM speaking directly to patient and family. Spoke with patient's daughter  Ms Huston Foley via phone.  Family will be staying with patient "as long as we can."  She has a BCS, Scooter and a walker.  She is agreeable with home health services and no agency preference.   Referral to [facility] for physical therapy and aide.  MD did not feel SN was indicated.  Agency can make initial visit 7/26.

## 2014-08-20 NOTE — Care Management Important Message (Signed)
Important Message  Patient Details  Name: Madison Fry MRN: [id number] Date of Birth: Nov 23, 1928   Medicare Important Message Given:  Yes-second notification given    [name] 08/20/2014, 11:29 AM

## 2014-09-24 ENCOUNTER — Other Ambulatory Visit: Payer: Self-pay | Admitting: *Deleted

## 2015-01-15 ENCOUNTER — Inpatient Hospital Stay: Payer: Commercial Managed Care - HMO | Attending: Oncology | Admitting: Oncology

## 2015-01-15 VITALS — BP 135/92 | HR 82 | Temp 98.6°F | Resp 16 | Wt 173.1 lb

## 2015-01-15 DIAGNOSIS — I10 Essential (primary) hypertension: Secondary | ICD-10-CM | POA: Insufficient documentation

## 2015-01-15 DIAGNOSIS — Z79899 Other long term (current) drug therapy: Secondary | ICD-10-CM | POA: Diagnosis not present

## 2015-01-15 DIAGNOSIS — M818 Other osteoporosis without current pathological fracture: Secondary | ICD-10-CM | POA: Diagnosis not present

## 2015-01-15 DIAGNOSIS — E119 Type 2 diabetes mellitus without complications: Secondary | ICD-10-CM | POA: Diagnosis not present

## 2015-01-15 DIAGNOSIS — C50919 Malignant neoplasm of unspecified site of unspecified female breast: Secondary | ICD-10-CM

## 2015-01-15 DIAGNOSIS — Z853 Personal history of malignant neoplasm of breast: Secondary | ICD-10-CM | POA: Diagnosis not present

## 2015-01-15 DIAGNOSIS — Z7984 Long term (current) use of oral hypoglycemic drugs: Secondary | ICD-10-CM | POA: Insufficient documentation

## 2015-01-15 NOTE — Progress Notes (Signed)
Patient denies any problems today.

## 2015-01-27 NOTE — Progress Notes (Signed)
[facility]  [phone] [phone]  ID: Madison Fry OB: [birth year]  MR#: [id number]  KNL#:[id number]  Patient Care Team: Mayra Neer, MD as PCP - General  CHIEF COMPLAINT:  Chief Complaint  Patient presents with  . Breast Cancer    INTERVAL HISTORY: Patient last seen in clinic in January 2016. She currently feels well and is asymptomatic. She continues to tolerate her tamoxifen well without significant side effects.  She denies any hot flashes or arthralgias.  She has no recent fevers or illnesses.  She has good appetite and has maintained her weight.  She has no chest pain or shortness of breath.  She denies any nausea, vomiting, constipation, or diarrhea.  She has no urinary complaints.  Patient feels at her baseline and offers no specific complaints today.  REVIEW OF SYSTEMS:   Review of Systems  Constitutional: Negative.  Negative for fever and malaise/fatigue.  Respiratory: Negative.   Cardiovascular: Negative.   Gastrointestinal: Negative.   Musculoskeletal: Negative.   Neurological: Negative.  Negative for weakness.    As per HPI. Otherwise, a complete review of systems is negatve.  PAST MEDICAL HISTORY: Past Medical History  Diagnosis Date  . Diabetes mellitus without complication   . Hypertension   . Cancer     breast cancer, Right side lymph nodes removed    PAST SURGICAL HISTORY: Right colectomy for benign disease, right lumpectomy.  FAMILY HISTORY: Reviewed and unchanged. No reported history of malignancy or chronic disease.     ADVANCED DIRECTIVES:    HEALTH MAINTENANCE: Social History  Substance Use Topics  . Smoking status: Never Smoker   . Smokeless tobacco: Not on file  . Alcohol Use: No     Colonoscopy:  PAP:  Bone density:  Lipid panel:  Allergies  Allergen Reactions  . Shellfish Allergy Swelling    Current Outpatient Prescriptions  Medication Sig Dispense Refill  .  atenolol-chlorthalidone (TENORETIC) 50-25 MG per tablet Take 2 tablets by mouth daily.    . cephALEXin (KEFLEX) 500 MG capsule Take 1 capsule (500 mg total) by mouth 2 (two) times daily. 20 capsule 0  . colesevelam (WELCHOL) 625 MG tablet Take by mouth 2 (two) times daily with a meal.    . ezetimibe (ZETIA) 10 MG tablet Take 10 mg by mouth daily.    Marland Kitchen glimepiride (AMARYL) 2 MG tablet Take 2 mg by mouth daily with breakfast.    . HYDROcodone-acetaminophen (NORCO/VICODIN) 5-325 MG tablet TAKE 1-2 TABLETS BY MOUTH EVERY 8 HOURS AS NEEDED FOR PAIN  0  . levothyroxine (SYNTHROID, LEVOTHROID) 50 MCG tablet Take 50 mcg by mouth daily.    Marland Kitchen losartan (COZAAR) 100 MG tablet Take 100 mg by mouth daily.    . metFORMIN (GLUCOPHAGE-XR) 500 MG 24 hr tablet TAKE 1 TABLET BY MOUTH WITH MEALS  2  . tamoxifen (NOLVADEX) 20 MG tablet Take 1 tablet (20 mg total) by mouth daily. 90 tablet 0  . traMADol (ULTRAM) 50 MG tablet Take 100 mg by mouth 2 (two) times daily.     No current facility-administered medications for this visit.    OBJECTIVE: Filed Vitals:   01/15/15 1414  BP: 135/92  Pulse: 82  Temp: 98.6 F (37 C)  Resp: 16     Body mass index is 28.8 kg/(m^2).    ECOG FS:0 - Asymptomatic  General: Well-developed, well-nourished, no acute distress. Eyes: Pink conjunctiva, anicteric sclera. Breasts: Patient refused breast exam today. Lungs: Clear to auscultation bilaterally. Heart: Regular  rate and rhythm. No rubs, murmurs, or gallops. Abdomen: Soft, nontender, nondistended. No organomegaly noted, normoactive bowel sounds. Musculoskeletal: No edema, cyanosis, or clubbing. Neuro: Alert, answering all questions appropriately. Cranial nerves grossly intact. Skin: No rashes or petechiae noted. Psych: Normal affect.  LAB RESULTS:  Lab Results  Component Value Date   NA 133* 08/18/2014   K 3.5 08/18/2014   CL 101 08/18/2014   CO2 24 08/18/2014   GLUCOSE 164* 08/18/2014   BUN 17 08/18/2014    CREATININE 0.98 08/18/2014   CALCIUM 7.9* 08/18/2014   PROT 5.7* 08/18/2014   ALBUMIN 3.0* 08/18/2014   AST 15 08/18/2014   ALT 10* 08/18/2014   ALKPHOS 45 08/18/2014   BILITOT 1.4* 08/18/2014   GFRNONAA 52* 08/18/2014   GFRAA >60 08/18/2014    Lab Results  Component Value Date   WBC 10.2 08/19/2014   NEUTROABS 13.6* 08/17/2014   HGB 10.8* 08/19/2014   HCT 31.7* 08/19/2014   MCV 94.5 08/19/2014   PLT 129* 08/19/2014     STUDIES: No results found.  ASSESSMENT: Stage Ia adenocarcinoma of the right breast.   PLAN:    1. Breast cancer:  No evidence of disease. Patient has now had 5 years of tamoxifen and has been instructed to discontinue when she completes her current prescription. Patient reports her most recent mammogram was reported as normal. Patient has requested no further follow-up. Continue monitor yearly mammogram which can be ordered by her primary care physician.  2.  Osteoporosis: Treatment per PCP. Continue calcium and vitamin D supplementation.   Patient expressed understanding and was in agreement with this plan. She also understands that She can call clinic at any time with any questions, concerns, or complaints.   Lloyd Huger, MD   01/27/2015 9:15 AM

## 2015-03-18 ENCOUNTER — Other Ambulatory Visit: Payer: Self-pay | Admitting: Family Medicine

## 2015-03-18 ENCOUNTER — Ambulatory Visit
Admission: RE | Admit: 2015-03-18 | Discharge: 2015-03-18 | Disposition: A | Discharge status: Home / Self Care | Payer: Commercial Managed Care - HMO | Source: Ambulatory Visit | Attending: Family Medicine | Admitting: Family Medicine

## 2015-03-18 DIAGNOSIS — R062 Wheezing: Secondary | ICD-10-CM

## 2015-04-05 ENCOUNTER — Ambulatory Visit
Admission: RE | Admit: 2015-04-05 | Discharge: 2015-04-05 | Disposition: A | Discharge status: Home / Self Care | Payer: Commercial Managed Care - HMO | Source: Ambulatory Visit | Attending: Family Medicine | Admitting: Family Medicine

## 2015-04-05 ENCOUNTER — Other Ambulatory Visit: Payer: Self-pay | Admitting: Family Medicine

## 2015-04-05 DIAGNOSIS — J189 Pneumonia, unspecified organism: Secondary | ICD-10-CM

## 2015-04-09 ENCOUNTER — Other Ambulatory Visit: Payer: Self-pay | Admitting: Family Medicine

## 2015-04-09 DIAGNOSIS — J189 Pneumonia, unspecified organism: Secondary | ICD-10-CM

## 2015-04-10 ENCOUNTER — Emergency Department (HOSPITAL_COMMUNITY): Payer: Commercial Managed Care - HMO

## 2015-04-10 ENCOUNTER — Observation Stay (HOSPITAL_COMMUNITY)
Admission: EM | Admit: 2015-04-10 | Discharge: 2015-04-12 | Disposition: A | Discharge status: Home / Self Care | Payer: Commercial Managed Care - HMO | Attending: Internal Medicine | Admitting: Internal Medicine

## 2015-04-10 ENCOUNTER — Encounter (HOSPITAL_COMMUNITY): Payer: Self-pay | Admitting: Family Medicine

## 2015-04-10 DIAGNOSIS — I10 Essential (primary) hypertension: Secondary | ICD-10-CM | POA: Insufficient documentation

## 2015-04-10 DIAGNOSIS — D72829 Elevated white blood cell count, unspecified: Secondary | ICD-10-CM | POA: Diagnosis not present

## 2015-04-10 DIAGNOSIS — F419 Anxiety disorder, unspecified: Secondary | ICD-10-CM | POA: Diagnosis not present

## 2015-04-10 DIAGNOSIS — C3411 Malignant neoplasm of upper lobe, right bronchus or lung: Secondary | ICD-10-CM | POA: Insufficient documentation

## 2015-04-10 DIAGNOSIS — R0600 Dyspnea, unspecified: Secondary | ICD-10-CM | POA: Diagnosis present

## 2015-04-10 DIAGNOSIS — Z79899 Other long term (current) drug therapy: Secondary | ICD-10-CM | POA: Diagnosis not present

## 2015-04-10 DIAGNOSIS — Z853 Personal history of malignant neoplasm of breast: Secondary | ICD-10-CM | POA: Diagnosis not present

## 2015-04-10 DIAGNOSIS — R222 Localized swelling, mass and lump, trunk: Secondary | ICD-10-CM | POA: Diagnosis not present

## 2015-04-10 DIAGNOSIS — Z923 Personal history of irradiation: Secondary | ICD-10-CM | POA: Insufficient documentation

## 2015-04-10 DIAGNOSIS — E119 Type 2 diabetes mellitus without complications: Secondary | ICD-10-CM

## 2015-04-10 DIAGNOSIS — Z79891 Long term (current) use of opiate analgesic: Secondary | ICD-10-CM | POA: Diagnosis not present

## 2015-04-10 DIAGNOSIS — Z7984 Long term (current) use of oral hypoglycemic drugs: Secondary | ICD-10-CM | POA: Insufficient documentation

## 2015-04-10 DIAGNOSIS — E860 Dehydration: Secondary | ICD-10-CM | POA: Diagnosis not present

## 2015-04-10 DIAGNOSIS — E871 Hypo-osmolality and hyponatremia: Secondary | ICD-10-CM | POA: Diagnosis present

## 2015-04-10 DIAGNOSIS — R918 Other nonspecific abnormal finding of lung field: Secondary | ICD-10-CM

## 2015-04-10 DIAGNOSIS — R0602 Shortness of breath: Secondary | ICD-10-CM | POA: Diagnosis present

## 2015-04-10 DIAGNOSIS — E785 Hyperlipidemia, unspecified: Secondary | ICD-10-CM | POA: Insufficient documentation

## 2015-04-10 DIAGNOSIS — Z23 Encounter for immunization: Secondary | ICD-10-CM | POA: Insufficient documentation

## 2015-04-10 DIAGNOSIS — Z87891 Personal history of nicotine dependence: Secondary | ICD-10-CM | POA: Insufficient documentation

## 2015-04-10 DIAGNOSIS — C3401 Malignant neoplasm of right main bronchus: Secondary | ICD-10-CM | POA: Diagnosis not present

## 2015-04-10 DIAGNOSIS — E1100 Type 2 diabetes mellitus with hyperosmolarity without nonketotic hyperglycemic-hyperosmolar coma (NKHHC): Secondary | ICD-10-CM

## 2015-04-10 DIAGNOSIS — R590 Localized enlarged lymph nodes: Secondary | ICD-10-CM | POA: Insufficient documentation

## 2015-04-10 LAB — PROTIME-INR
INR: 0.96 (ref 0.00–1.49)
PROTHROMBIN TIME: 13 s (ref 11.6–15.2)

## 2015-04-10 LAB — CBC WITH DIFFERENTIAL/PLATELET
BASOS ABS: 0 10*3/uL (ref 0.0–0.1)
BASOS PCT: 0 %
EOS ABS: 0.1 10*3/uL (ref 0.0–0.7)
EOS PCT: 0 %
HCT: 39.2 % (ref 36.0–46.0)
Hemoglobin: 13.4 g/dL (ref 12.0–15.0)
Lymphocytes Relative: 9 %
Lymphs Abs: 1.4 10*3/uL (ref 0.7–4.0)
MCH: 30.5 pg (ref 26.0–34.0)
MCHC: 34.2 g/dL (ref 30.0–36.0)
MCV: 89.1 fL (ref 78.0–100.0)
Monocytes Absolute: 0.8 10*3/uL (ref 0.1–1.0)
Monocytes Relative: 5 %
NEUTROS PCT: 86 %
Neutro Abs: 12.4 10*3/uL — ABNORMAL HIGH (ref 1.7–7.7)
PLATELETS: 263 10*3/uL (ref 150–400)
RBC: 4.4 MIL/uL (ref 3.87–5.11)
RDW: 13.4 % (ref 11.5–15.5)
WBC: 14.6 10*3/uL — AB (ref 4.0–10.5)

## 2015-04-10 LAB — BASIC METABOLIC PANEL
Anion gap: 11 (ref 5–15)
BUN: 19 mg/dL (ref 6–20)
CALCIUM: 9 mg/dL (ref 8.9–10.3)
CO2: 21 mmol/L — ABNORMAL LOW (ref 22–32)
CREATININE: 0.67 mg/dL (ref 0.44–1.00)
Chloride: 100 mmol/L — ABNORMAL LOW (ref 101–111)
Glucose, Bld: 130 mg/dL — ABNORMAL HIGH (ref 65–99)
Potassium: 3.8 mmol/L (ref 3.5–5.1)
SODIUM: 132 mmol/L — AB (ref 135–145)

## 2015-04-10 LAB — I-STAT CG4 LACTIC ACID, ED: LACTIC ACID, VENOUS: 1.36 mmol/L (ref 0.5–2.0)

## 2015-04-10 LAB — PROCALCITONIN: PROCALCITONIN: 0.19 ng/mL

## 2015-04-10 LAB — GLUCOSE, CAPILLARY: GLUCOSE-CAPILLARY: 169 mg/dL — AB (ref 65–99)

## 2015-04-10 MED ORDER — ONDANSETRON HCL 4 MG PO TABS: 4.0000 mg | ORAL_TABLET | Freq: Four times a day (QID) | ORAL | Status: DC | PRN

## 2015-04-10 MED ORDER — LEVOTHYROXINE SODIUM 50 MCG PO TABS
50.0000 ug | ORAL_TABLET | Freq: Every day | ORAL | Status: DC
  Administered 2015-04-11 – 2015-04-12 (×2): 50 ug via ORAL
  Filled 2015-04-10 (×2): qty 1

## 2015-04-10 MED ORDER — ALUM & MAG HYDROXIDE-SIMETH 200-200-20 MG/5ML PO SUSP: 30.0000 mL | Freq: Four times a day (QID) | ORAL | Status: DC | PRN

## 2015-04-10 MED ORDER — ALBUTEROL SULFATE (2.5 MG/3ML) 0.083% IN NEBU
5.0000 mg | INHALATION_SOLUTION | Freq: Once | RESPIRATORY_TRACT | Status: DC
  Administered 2015-04-10: 5 mg via RESPIRATORY_TRACT
  Filled 2015-04-10 (×2): qty 6

## 2015-04-10 MED ORDER — BRIMONIDINE TARTRATE 0.2 % OP SOLN
1.0000 [drp] | Freq: Two times a day (BID) | OPHTHALMIC | Status: DC
  Administered 2015-04-11 – 2015-04-12 (×4): 1 [drp] via OPHTHALMIC
  Filled 2015-04-10 (×2): qty 5

## 2015-04-10 MED ORDER — TIMOLOL MALEATE 0.5 % OP SOLN
1.0000 [drp] | Freq: Two times a day (BID) | OPHTHALMIC | Status: DC
  Administered 2015-04-11: 1 [drp] via OPHTHALMIC
  Filled 2015-04-10 (×2): qty 5

## 2015-04-10 MED ORDER — IOHEXOL 300 MG/ML  SOLN
100.0000 mL | Freq: Once | INTRAMUSCULAR | Status: AC | PRN
  Administered 2015-04-10: 80 mL via INTRAVENOUS

## 2015-04-10 MED ORDER — HYDROCODONE-ACETAMINOPHEN 5-325 MG PO TABS: 1.0000 | ORAL_TABLET | Freq: Four times a day (QID) | ORAL | Status: DC | PRN

## 2015-04-10 MED ORDER — ONDANSETRON HCL 4 MG/2ML IJ SOLN: 4.0000 mg | Freq: Four times a day (QID) | INTRAMUSCULAR | Status: DC | PRN

## 2015-04-10 MED ORDER — POLYETHYLENE GLYCOL 3350 17 G PO PACK: 17.0000 g | PACK | Freq: Every day | ORAL | Status: DC | PRN

## 2015-04-10 MED ORDER — ACETAMINOPHEN 650 MG RE SUPP: 650.0000 mg | Freq: Four times a day (QID) | RECTAL | Status: DC | PRN

## 2015-04-10 MED ORDER — BISACODYL 5 MG PO TBEC: 5.0000 mg | DELAYED_RELEASE_TABLET | Freq: Every day | ORAL | Status: DC | PRN

## 2015-04-10 MED ORDER — LATANOPROST 0.005 % OP SOLN
1.0000 [drp] | Freq: Every day | OPHTHALMIC | Status: DC
  Filled 2015-04-10: qty 2.5

## 2015-04-10 MED ORDER — LORAZEPAM 0.5 MG PO TABS
0.5000 mg | ORAL_TABLET | ORAL | Status: DC | PRN
  Administered 2015-04-11: 0.5 mg via ORAL
  Filled 2015-04-10: qty 1

## 2015-04-10 MED ORDER — BRIMONIDINE TARTRATE-TIMOLOL 0.2-0.5 % OP SOLN: 1.0000 [drp] | Freq: Two times a day (BID) | OPHTHALMIC | Status: DC

## 2015-04-10 MED ORDER — EZETIMIBE 10 MG PO TABS
10.0000 mg | ORAL_TABLET | Freq: Every day | ORAL | Status: DC
  Administered 2015-04-11 – 2015-04-12 (×3): 10 mg via ORAL
  Filled 2015-04-10 (×3): qty 1

## 2015-04-10 MED ORDER — HEPARIN SODIUM (PORCINE) 5000 UNIT/ML IJ SOLN
5000.0000 [IU] | Freq: Three times a day (TID) | INTRAMUSCULAR | Status: DC
  Administered 2015-04-11 (×4): 5000 [IU] via SUBCUTANEOUS
  Filled 2015-04-10 (×5): qty 1

## 2015-04-10 MED ORDER — INSULIN ASPART 100 UNIT/ML ~~LOC~~ SOLN
0.0000 [IU] | Freq: Three times a day (TID) | SUBCUTANEOUS | Status: DC
  Administered 2015-04-11 (×2): 2 [IU] via SUBCUTANEOUS

## 2015-04-10 MED ORDER — COLESEVELAM HCL 625 MG PO TABS
625.0000 mg | ORAL_TABLET | Freq: Two times a day (BID) | ORAL | Status: DC
  Administered 2015-04-11 – 2015-04-12 (×3): 625 mg via ORAL
  Filled 2015-04-10 (×5): qty 1

## 2015-04-10 MED ORDER — VERAPAMIL HCL ER 120 MG PO TBCR
120.0000 mg | EXTENDED_RELEASE_TABLET | Freq: Every day | ORAL | Status: DC
  Administered 2015-04-11 – 2015-04-12 (×2): 120 mg via ORAL
  Filled 2015-04-10 (×2): qty 1

## 2015-04-10 MED ORDER — LOSARTAN POTASSIUM 50 MG PO TABS
100.0000 mg | ORAL_TABLET | Freq: Every day | ORAL | Status: DC
  Administered 2015-04-11 – 2015-04-12 (×3): 100 mg via ORAL
  Filled 2015-04-10 (×3): qty 2

## 2015-04-10 MED ORDER — ACETAMINOPHEN 325 MG PO TABS: 650.0000 mg | ORAL_TABLET | Freq: Four times a day (QID) | ORAL | Status: DC | PRN

## 2015-04-10 MED ORDER — IPRATROPIUM-ALBUTEROL 0.5-2.5 (3) MG/3ML IN SOLN
3.0000 mL | Freq: Once | RESPIRATORY_TRACT | Status: AC
  Administered 2015-04-10: 3 mL via RESPIRATORY_TRACT
  Filled 2015-04-10: qty 3

## 2015-04-10 NOTE — H&P (Signed)
[facility] History and Physical  Madison Fry DOB: [birth year] DOA: 04/10/2015  Referring physician: ED physician PCP: Mayra Neer, MD  Specialists:  Dr. Grayland Ormond (oncology)   Chief Complaint:  Dyspnea   HPI: Madison Fry is a 82 y.o. female with PMH of type 2 diabetes mellitus, hypertension, hyperlipidemia, and cancer of the right breast status post local resection and lymph node dissection who presents to the ED with 1 month of dyspnea. Patient has been undergoing evaluation by her primary care physician for this and recently had a chest x-ray with findings suspicious for pneumonia. She was given IM steroid in the office and prescribed seven-day course of Avelox, of which there is one day remaining. She noted some mild improvement in symptoms for 1-2 days after receiving the steroid and starting antibiotics, but then notes reworsening and persistent dyspnea since that time. She denies fevers or chills, and denies chest pain or palpitations. There's been no rhinorrhea or sore throat and she denies sick contacts or long distance travel. She saw her oncologist in December 2016 and appeared to be doing well at that time and he just completed her 5 years of tamoxifen.   In ED, patient was found to be afebrile, saturating well on room air, and with vital signs stable. EKG feature to sinus rhythm with left axis deviation. Blood work is notable for a leukocytosis to 14,600 and mild hyponatremia and hypochloremia. Chest x-ray featured an opacity that had not appeared to improved since the prior film obtained by the patient's PCP, raising concern for a mass. This was followed by a contrast-enhanced CT of the chest which features a large central right lung/hilar mass extending into the mediastinum and circumferentially encasing the right main stem bronchus and pulmonary arteries to all 3 lobes. There is an associated postobstructive collapse but no findings particularly suggestive  of infection. Mediastinal lymphadenopathy is also noted on this study. Dr. Julien Nordmann of oncology was consulted by EDP and, given the patient's age and symptoms, it was recommended that she be admitted for inpatient workup of the mass. Blood cultures have been obtained in the emergency department, the patient remained hemodynamically stable, and will be admitted to the hospital for ongoing evaluation and management of dyspnea suspected secondary to new lung mass.  Where does patient live?   At home     Can patient participate in ADLs?  Yes       Review of Systems:   General: no fevers, chills, sweats, poor appetite, or fatigue. Weight loss, intentional  HEENT: no blurry vision, hearing changes or sore throat Pulm: Non-productive cough, dyspnea, wheeze CV: no chest pain or palpitations Abd: no nausea, vomiting, abdominal pain, diarrhea, or constipation GU: no dysuria, hematuria, increased urinary frequency, or urgency  Ext: no leg edema Neuro: no focal weakness, numbness, or tingling, no vision change or hearing loss Skin: no rash, no wounds MSK: No muscle spasm, no deformity, no red, hot, or swollen joint Heme: No easy bruising or bleeding Travel history: No recent long distant travel    Allergy:  Allergies  Allergen Reactions  . Shellfish Allergy Swelling    Past Medical History  Diagnosis Date  . Diabetes mellitus without complication ([facility])   . Hypertension   . Cancer [facility])     breast cancer, Right side lymph nodes removed    History reviewed. No pertinent past surgical history.  Social History:  reports that she has never smoked. She does not have any smokeless tobacco history on file.  She reports that she does not drink alcohol. Her drug history is not on file.  Family History: History reviewed. No pertinent family history.   Prior to Admission medications   Medication Sig Start Date End Date Taking? Authorizing Provider  colesevelam (WELCHOL) 625 MG tablet Take by mouth 2  (two) times daily with a meal.   Yes Historical Provider, MD  COMBIGAN 0.2-0.5 % ophthalmic solution PUT 1 DROP IN AFFECTED EYE EVERY 12 HOURS**NEED OFFICE VISIT** 03/08/15  Yes Historical Provider, MD  ezetimibe (ZETIA) 10 MG tablet Take 10 mg by mouth daily.   Yes Historical Provider, MD  glimepiride (AMARYL) 2 MG tablet Take 2 mg by mouth daily with breakfast.   Yes Historical Provider, MD  HYDROcodone-acetaminophen (NORCO/VICODIN) 5-325 MG tablet TAKE 1-2 TABLETS BY MOUTH EVERY 8 HOURS AS NEEDED FOR PAIN 11/06/14  Yes Historical Provider, MD  levothyroxine (SYNTHROID, LEVOTHROID) 50 MCG tablet Take 50 mcg by mouth daily.   Yes Historical Provider, MD  losartan (COZAAR) 100 MG tablet Take 100 mg by mouth daily.   Yes Historical Provider, MD  metFORMIN (GLUCOPHAGE-XR) 500 MG 24 hr tablet TAKE 1 TABLET BY MOUTH WITH MEALS 11/05/14  Yes Historical Provider, MD  moxifloxacin (AVELOX) 400 MG tablet Take 400 mg by mouth daily at 8 pm. ABT Start Date 04/05/15 & End Date 04/11/15. For 7 Days.   Yes Historical Provider, MD  TRAVATAN Z 0.004 % SOLN ophthalmic solution PUT 1 DROP INTO BOTH EYES AT BEDTIME 03/16/15  Yes Historical Provider, MD  verapamil (CALAN-SR) 120 MG CR tablet Take 120 mg by mouth daily.   Yes Historical Provider, MD  cephALEXin (KEFLEX) 500 MG capsule Take 1 capsule (500 mg total) by mouth 2 (two) times daily. Patient not taking: Reported on 04/10/2015 08/20/14   Epifanio Lesches, MD  tamoxifen (NOLVADEX) 20 MG tablet Take 1 tablet (20 mg total) by mouth daily. Patient not taking: Reported on 04/10/2015 07/23/14   Lloyd Huger, MD    Physical Exam: Filed Vitals:   04/10/15 1752 04/10/15 1835 04/10/15 2028  BP: 167/72 168/83 148/71  Pulse: 103 104 95  Temp: 97.9 F (36.6 C)    TempSrc: Oral    Resp: '18 18 20  '$ Height: '5\' 5"'$  (1.651 m)    Weight: 79.379 kg (175 lb)    SpO2: 94% 94% 97%   General: Not in acute distress HEENT:       Eyes: PERRL, EOMI, no scleral icterus or  conjunctival pallor.       ENT: No discharge from the ears or nose, no pharyngeal ulcers, petechiae or exudate, no tonsillar enlargement.        Neck: No JVD, no bruit, no appreciable mass Heme: No cervical adenopathy, no pallor Cardiac: S1/S2, RRR, soft systolic murmur at LSB, No gallops or rubs. Pulm: Good air movement bilaterally. Audible wheezes, right-sided crackles Abd: Soft, nondistended, nontender, no rebound pain or gaurding, no mass or organomegaly, BS present. Ext: No LE edema bilaterally. 2+DP/PT pulse bilaterally. Musculoskeletal: No gross deformity, no red, hot, swollen joints   Skin: No rashes or wounds on exposed surfaces  Neuro: Alert, oriented X3, cranial nerves II-XII grossly intact. No focal findings Psych: Patient is not overtly psychotic, appropriate mood and affect. Anxious  Labs on Admission:  Basic Metabolic Panel:  Recent Labs Lab 04/10/15 1910  NA 132*  K 3.8  CL 100*  CO2 21*  GLUCOSE 130*  BUN 19  CREATININE 0.67  CALCIUM 9.0   Liver Function Tests:  No results for input(s): AST, ALT, ALKPHOS, BILITOT, PROT, ALBUMIN in the last 168 hours. No results for input(s): LIPASE, AMYLASE in the last 168 hours. No results for input(s): AMMONIA in the last 168 hours. CBC:  Recent Labs Lab 04/10/15 1910  WBC 14.6*  NEUTROABS 12.4*  HGB 13.4  HCT 39.2  MCV 89.1  PLT 263   Cardiac Enzymes: No results for input(s): CKTOTAL, CKMB, CKMBINDEX, TROPONINI in the last 168 hours.  BNP (last 3 results) No results for input(s): BNP in the last 8760 hours.  ProBNP (last 3 results) No results for input(s): PROBNP in the last 8760 hours.  CBG: No results for input(s): GLUCAP in the last 168 hours.  Radiological Exams on Admission: Dg Chest 2 View  04/10/2015  CLINICAL DATA:  Weakness and shortness of Breath wall being treated for pneumonia. EXAM: CHEST  2 VIEW COMPARISON:  04/05/2015 FINDINGS: AP and lateral views of the chest show increase in volume loss in  the right hemi thorax with widening of the right paratracheal stripe and more confluent density along the lateral aspect of the right lung, likely in the upper lobe. Left lung remains clear. Cardiopericardial silhouette is at upper limits of normal for size. Degenerative changes are noted in the shoulders bilaterally. No evidence for pleural effusion. IMPRESSION: Continued progression of airspace disease in the right lung since 03/18/2015 with increasing right lung volume loss and abnormal right paratracheal opacity associated with apparent caught off of the right mainstem bronchus. CT chest with contrast recommended to exclude the presence of a central obstructing mass lesion. Electronically Signed   By: Misty Stanley M.D.   On: 04/10/2015 18:20   Ct Chest W Contrast  04/10/2015  CLINICAL DATA:  Shortness of Breath.  Worsening chest x-ray. EXAM: CT CHEST WITH CONTRAST TECHNIQUE: Multidetector CT imaging of the chest was performed during intravenous contrast administration. CONTRAST:  103m OMNIPAQUE IOHEXOL 300 MG/ML  SOLN COMPARISON:  Chest x-ray from earlier today. FINDINGS: Mediastinum / Lymph Nodes: There is no axillary lymphadenopathy. 3.4 x 5.4 cm nodal conglomeration is identified in the right paratracheal space. 8.0 x 4.3 cm mass centered in the region of the right hilum circumferentially encases the right mainstem bronchus and markedly attenuates the bronchus intermedius. The right upper lobe bronchus is occluded. This abnormal soft tissue circumferentially encases the right upper lobe and right middle lobe pulmonary arteries. Enter lobar pulmonary artery is circumferentially encased and attenuated. 17 mm short axis subcarinal lymph node seen on image 26 series 2. 18 mm paraesophageal short axis lymph node seen on image 16 of series 2. No left hilar lymphadenopathy. The heart size is normal. No pericardial effusion. Coronary artery calcification is noted. Lungs / Pleura: Volume loss noted in the right  upper and middle lobes. Confluent airspace opacity in the periphery of the right upper lobe may be a combination of neoplasm and postobstructive collapse. There is a small right pleural effusion. Upper Abdomen:  Unremarkable. MSK / Soft Tissues: Bone windows reveal no worrisome lytic or sclerotic osseous lesions. IMPRESSION: 1. Large central right lung/hilar mass extends into the mediastinum, circumferentially encasing the right mainstem bronchus and pulmonary arteries to all 3 lobes of the right lung. There is associated mediastinal lymphadenopathy and probably associated postobstructive collapse from the central lung lesion. Electronically Signed   By: EMisty StanleyM.D.   On: 04/10/2015 20:25    EKG: Independently reviewed.  Abnormal findings:  Sinus rhythm, LAD  Assessment/Plan  1. Right hilar mass  -  Mass seen on CXR in February with PNA suspected at that time, seen again on outpt CXR from 04/05/15 and CT was recommended by radiology - She has been on Avelox since 04/05/15 with no appreciable improvement  - CT obtained in ED on presentation is described above, revealing mass with post-obstructive collapse  - EDP discussed case with Dr. Julien Nordmann of oncology and, given her age and symptoms, inpt workup is recommended   2. Dyspnea  - Suspected secondary to #1  - She had a little improvement with neb treatment in ED, will continue q4h prn  - Saturating well on rm air  - Anxiety likely contributes    3. Anxiety  - Pt understandably quite anxious about the current situation involving new mass  - She asks for "something for my nerves," will trial Ativan 0.5 mg PO q4h prn  - Pt's brother at bedside for support   4. Leukocytosis  - WBC 14.6k on admission  - Suspect this is secondary to malignancy rather than infection  - Blood cultures are incubating; plan to start abx if cxs positive or new findings emerge to suggest infxn  - Check procalcitonin   5. Type II DM   - Hold home metformin and  glimepiride while inpt  - Check CBG with meals and qHS  - Moderate-intensity SSI correctional for now, adjust regimen prn  - Carb-modified diet when appropriate   6. Hyponatremia, hypochloremia  - Suspected secondary to mild dehydration  - Anticipate resolution with gentle IVF hydration  - Repeat chem panel in am to confirm resolution     DVT ppx: SQ Heparin     Code Status: Full code Family Communication:  Yes, patient's rother at bed side Disposition Plan: Admit to inpatient   Date of Service 04/10/2015    Vianne Bulls, MD [facility] Pager [phone]  If 7PM-7AM, please contact night-coverage [email or web address] Password [facility] 04/10/2015, 9:47 PM

## 2015-04-10 NOTE — ED Notes (Signed)
Pt from home.  She has been Dx with PNA by PCP and has one more antibiotic pill to finish.  She had an injection of steroid.  Pt feels she is not improving and is still weak and ShOB.  Pt reports change in BP medication 2 weeks ago.

## 2015-04-10 NOTE — ED Provider Notes (Signed)
CSN: [id number]     Arrival date & time 04/10/15  1743 History   First MD Initiated Contact with Patient 04/10/15 1839     Chief Complaint  Patient presents with  . Shortness of Breath     (Consider location/radiation/quality/duration/timing/severity/associated sxs/prior Treatment) HPI  82 year old female presents with shortness of breath for about one month. Has been having intermittent dry cough during this time. Patient states she was seen by her PCP and had an x-ray that showed pneumonia. She was placed on Avelox and has one dose left in a 7 day course. Patient has not had fevers. She has been feeling intermittent short of breath and having wheezing. She was given a steroid shot by her PCP as well. Patient states she has lost some weight but states this is because she has had decreased oral intake because her blood pressure has been high. Denies leg swelling. No hematemesis or hemoptysis. Has a prior history of smoking but states she does not have a history of COPD.  Past Medical History  Diagnosis Date  . Diabetes mellitus without complication   . Hypertension   . Cancer     breast cancer, Right side lymph nodes removed   No past surgical history on file. No family history on file. Social History  Substance Use Topics  . Smoking status: Never Smoker   . Smokeless tobacco: Not on file  . Alcohol Use: No   OB History    No data available     Review of Systems  Constitutional: Negative for fever.  Respiratory: Positive for cough, shortness of breath and wheezing.   Cardiovascular: Negative for chest pain and leg swelling.  All other systems reviewed and are negative.     Allergies  Shellfish allergy  Home Medications   Prior to Admission medications   Medication Sig Start Date End Date Taking? Authorizing Provider  colesevelam (WELCHOL) 625 MG tablet Take by mouth 2 (two) times daily with a meal.   Yes Historical Provider, MD  COMBIGAN 0.2-0.5 % ophthalmic solution  PUT 1 DROP IN AFFECTED EYE EVERY 12 HOURS**NEED OFFICE VISIT** 03/08/15  Yes Historical Provider, MD  ezetimibe (ZETIA) 10 MG tablet Take 10 mg by mouth daily.   Yes Historical Provider, MD  glimepiride (AMARYL) 2 MG tablet Take 2 mg by mouth daily with breakfast.   Yes Historical Provider, MD  HYDROcodone-acetaminophen (NORCO/VICODIN) 5-325 MG tablet TAKE 1-2 TABLETS BY MOUTH EVERY 8 HOURS AS NEEDED FOR PAIN 11/06/14  Yes Historical Provider, MD  levothyroxine (SYNTHROID, LEVOTHROID) 50 MCG tablet Take 50 mcg by mouth daily.   Yes Historical Provider, MD  losartan (COZAAR) 100 MG tablet Take 100 mg by mouth daily.   Yes Historical Provider, MD  metFORMIN (GLUCOPHAGE-XR) 500 MG 24 hr tablet TAKE 1 TABLET BY MOUTH WITH MEALS 11/05/14  Yes Historical Provider, MD  moxifloxacin (AVELOX) 400 MG tablet Take 400 mg by mouth daily at 8 pm. ABT Start Date 04/05/15 & End Date 04/11/15. For 7 Days.   Yes Historical Provider, MD  TRAVATAN Z 0.004 % SOLN ophthalmic solution PUT 1 DROP INTO BOTH EYES AT BEDTIME 03/16/15  Yes Historical Provider, MD  verapamil (CALAN-SR) 120 MG CR tablet Take 120 mg by mouth daily.   Yes Historical Provider, MD  cephALEXin (KEFLEX) 500 MG capsule Take 1 capsule (500 mg total) by mouth 2 (two) times daily. Patient not taking: Reported on 04/10/2015 08/20/14   Epifanio Lesches, MD  tamoxifen (NOLVADEX) 20 MG tablet Take 1 tablet (  20 mg total) by mouth daily. Patient not taking: Reported on 04/10/2015 07/23/14   Lloyd Huger, MD   BP 168/83 mmHg  Pulse 104  Temp(Src) 97.9 F (36.6 C) (Oral)  Resp 18  Ht '5\' 5"'$  (1.651 m)  Wt 175 lb (79.379 kg)  BMI 29.12 kg/m2  SpO2 94% Physical Exam  Constitutional: She is oriented to person, place, and time. She appears well-developed and well-nourished. No distress.  HENT:  Head: Normocephalic and atraumatic.  Right Ear: External ear normal.  Left Ear: External ear normal.  Nose: Nose normal.  Eyes: Right eye exhibits no discharge.  Left eye exhibits no discharge.  Cardiovascular: Normal rate, regular rhythm and normal heart sounds.   Pulmonary/Chest: Effort normal and breath sounds normal.  Audible wheezing through mouth but no wheezing on lung exam. Decreased breath sounds Right vs Left  Abdominal: Soft. She exhibits no distension. There is no tenderness.  Neurological: She is alert and oriented to person, place, and time.  Skin: Skin is warm and dry. She is not diaphoretic.  Nursing note and vitals reviewed.   ED Course  Procedures (including critical care time) Labs Review Labs Reviewed  BASIC METABOLIC PANEL - Abnormal; Notable for the following:    Sodium 132 (*)    Chloride 100 (*)    CO2 21 (*)    Glucose, Bld 130 (*)    All other components within normal limits  CBC WITH DIFFERENTIAL/PLATELET - Abnormal; Notable for the following:    WBC 14.6 (*)    Neutro Abs 12.4 (*)    All other components within normal limits  GLUCOSE, CAPILLARY - Abnormal; Notable for the following:    Glucose-Capillary 169 (*)    All other components within normal limits  CULTURE, BLOOD (ROUTINE X 2)  CULTURE, BLOOD (ROUTINE X 2)  PROTIME-INR  PROCALCITONIN  HEMOGLOBIN A1C  I-STAT CG4 LACTIC ACID, ED    Imaging Review Dg Chest 2 View  04/10/2015  CLINICAL DATA:  Weakness and shortness of Breath wall being treated for pneumonia. EXAM: CHEST  2 VIEW COMPARISON:  04/05/2015 FINDINGS: AP and lateral views of the chest show increase in volume loss in the right hemi thorax with widening of the right paratracheal stripe and more confluent density along the lateral aspect of the right lung, likely in the upper lobe. Left lung remains clear. Cardiopericardial silhouette is at upper limits of normal for size. Degenerative changes are noted in the shoulders bilaterally. No evidence for pleural effusion. IMPRESSION: Continued progression of airspace disease in the right lung since 03/18/2015 with increasing right lung volume loss and  abnormal right paratracheal opacity associated with apparent caught off of the right mainstem bronchus. CT chest with contrast recommended to exclude the presence of a central obstructing mass lesion. Electronically Signed   By: Misty Stanley M.D.   On: 04/10/2015 18:20   Ct Chest W Contrast  04/10/2015  CLINICAL DATA:  Shortness of Breath.  Worsening chest x-ray. EXAM: CT CHEST WITH CONTRAST TECHNIQUE: Multidetector CT imaging of the chest was performed during intravenous contrast administration. CONTRAST:  106m OMNIPAQUE IOHEXOL 300 MG/ML  SOLN COMPARISON:  Chest x-ray from earlier today. FINDINGS: Mediastinum / Lymph Nodes: There is no axillary lymphadenopathy. 3.4 x 5.4 cm nodal conglomeration is identified in the right paratracheal space. 8.0 x 4.3 cm mass centered in the region of the right hilum circumferentially encases the right mainstem bronchus and markedly attenuates the bronchus intermedius. The right upper lobe bronchus is occluded. This  abnormal soft tissue circumferentially encases the right upper lobe and right middle lobe pulmonary arteries. Enter lobar pulmonary artery is circumferentially encased and attenuated. 17 mm short axis subcarinal lymph node seen on image 26 series 2. 18 mm paraesophageal short axis lymph node seen on image 16 of series 2. No left hilar lymphadenopathy. The heart size is normal. No pericardial effusion. Coronary artery calcification is noted. Lungs / Pleura: Volume loss noted in the right upper and middle lobes. Confluent airspace opacity in the periphery of the right upper lobe may be a combination of neoplasm and postobstructive collapse. There is a small right pleural effusion. Upper Abdomen:  Unremarkable. MSK / Soft Tissues: Bone windows reveal no worrisome lytic or sclerotic osseous lesions. IMPRESSION: 1. Large central right lung/hilar mass extends into the mediastinum, circumferentially encasing the right mainstem bronchus and pulmonary arteries to all 3 lobes  of the right lung. There is associated mediastinal lymphadenopathy and probably associated postobstructive collapse from the central lung lesion. Electronically Signed   By: Misty Stanley M.D.   On: 04/10/2015 20:25   I have personally reviewed and evaluated these images and lab results as part of my medical decision-making.   EKG Interpretation   Date/Time:  Wednesday April 10 2015 18:31:53 EDT Ventricular Rate:  97 PR Interval:  203 QRS Duration: 103 QT Interval:  367 QTC Calculation: 466 R Axis:   -39 Text Interpretation:  Sinus rhythm Left axis deviation Anteroseptal  infarct, old nonspecific ST/T waves improved since July 2016 Confirmed by  Regenia Skeeter  MD, Rossie Bretado ([id number]) on 04/10/2015 6:40:04 PM      MDM   Final diagnoses:  Hilar mass  Dyspnea    CT scan shows large central hilar mass on the right. Has some postobstructive collapse but no obvious pneumonia. This appears to be the reason for her cough and progressive dyspnea. Discussed with oncology on call, Dr. Earlie Server, who recommends admission given her age and dyspnea with inpatient workup of her new cancer. Discussed her diagnosis and answered questions at the bedside. Hospitalist will admit. Per the patient, she has been cleared of her breast cancer and was no longer planning on seeing oncology until this new diagnosis.    Sherwood Gambler, MD 04/11/15 [phone]

## 2015-04-11 ENCOUNTER — Encounter (HOSPITAL_COMMUNITY): Payer: Self-pay

## 2015-04-11 DIAGNOSIS — R0600 Dyspnea, unspecified: Secondary | ICD-10-CM

## 2015-04-11 DIAGNOSIS — R222 Localized swelling, mass and lump, trunk: Secondary | ICD-10-CM | POA: Diagnosis not present

## 2015-04-11 DIAGNOSIS — D72829 Elevated white blood cell count, unspecified: Secondary | ICD-10-CM | POA: Diagnosis not present

## 2015-04-11 DIAGNOSIS — E871 Hypo-osmolality and hyponatremia: Secondary | ICD-10-CM

## 2015-04-11 DIAGNOSIS — F419 Anxiety disorder, unspecified: Secondary | ICD-10-CM | POA: Diagnosis not present

## 2015-04-11 LAB — GLUCOSE, CAPILLARY
GLUCOSE-CAPILLARY: 127 mg/dL — AB (ref 65–99)
GLUCOSE-CAPILLARY: 114 mg/dL — AB (ref 65–99)
GLUCOSE-CAPILLARY: 123 mg/dL — AB (ref 65–99)
GLUCOSE-CAPILLARY: 130 mg/dL — AB (ref 65–99)

## 2015-04-11 MED ORDER — PNEUMOCOCCAL VAC POLYVALENT 25 MCG/0.5ML IJ INJ
0.5000 mL | INJECTION | INTRAMUSCULAR | Status: AC
  Administered 2015-04-12: 0.5 mL via INTRAMUSCULAR
  Filled 2015-04-11 (×2): qty 0.5

## 2015-04-11 MED ORDER — IPRATROPIUM-ALBUTEROL 0.5-2.5 (3) MG/3ML IN SOLN: 3.0000 mL | RESPIRATORY_TRACT | Status: DC | PRN

## 2015-04-11 NOTE — Progress Notes (Addendum)
Nursing Note: Pt arrived via stretcher. Alert and oriented x4. Pt denies pain or SOB at this time.Pt is aware that she may have a cancerous area in her lung.T-98.0 p-92 r-16 BP 145/63 PO2 94% on r/a.Pt oriented to room and educated to call for assistance. Pt states she uses and walker at home and still drives.Snack provided.Meds given. Pt resting quietly in bed.Call bell in reach and bed alarm is on.wbb

## 2015-04-11 NOTE — Progress Notes (Signed)
PROGRESS NOTE  HADAS JESSOP YPP:[id number] DOB: [birth year] DOA: 04/10/2015 PCP: Mayra Neer, MD Brief History 82 year old female with a history of diabetes mellitus type 2, hypertension, hyperlipidemia, right breast cancer status post local resection and lymph node dissection who presents to the ED with 1 month of dyspnea. Patient has been undergoing evaluation by her primary care physician for this and recently had a chest x-ray with findings suspicious for pneumonia. She was given IM steroid in the office and prescribed seven-day course of Avelox, of which there is one day remaining. She noted some mild improvement in symptoms for 1-2 days after receiving the steroid and starting antibiotics, but then notes reworsening and persistent dyspnea since that time. She denies fevers or chills, and denies chest pain or palpitations. There's been no rhinorrhea or sore throat and she denies sick contacts or long distance travel. She saw her oncologist in December 2016 and appeared to be doing well at that time and he just completed her 5 years of tamoxifen. In ED, patient was found to be afebrile, saturating well on room air, and with vital signs stable. CT of the chest revealed an 8.0 x 4.3 cm right hilar mass encasing the right main stem bronchus. There was also a soft tissue mass which encased the pulmonary artery to all 3 right lung lobes. There is an associated postobstructive collapse but no findings particularly suggestive of infection. Mediastinal lymphadenopathy is also noted on this study. Assessment/Plan: Right hilar mass  - Mass seen on CXR in February with PNA suspected at that time, seen again on outpt CXR from 04/05/15 and CT was recommended by radiology - She has been on Avelox since 04/05/15 with no appreciable improvement  - 04/10/15--CT chest as described above -Pulmonary consult-->discussed with Dr. Earnest Bailey for bronchoscopy 04/12/15 Dyspnea/stridor - Suspected  secondary to #1  - She had a little improvement with neb treatment in ED, will continue q4h prn  - Saturating well on rm air   Anxiety  - Pt understandably quite anxious about the current situation involving new mass  - She asks for "something for my nerves," will trial Ativan 0.5 mg PO q4h prn  - Pt's daughter at bedside for support   Leukocytosis  - WBC 14.6k on admission  - Suspect this is secondary to malignancy rather than infection  - Blood cultures are incubating; plan to start abx if cxs positive or new findings emerge to suggest infxn  - Check procalcitonin--0.19  Type II DM  - Hold home metformin and glimepiride while inpt  - Check CBG with meals and qHS  - Moderate-intensity SSI correctional for now, adjust regimen prn   Hyponatremia, hypochloremia  - Suspected secondary to mild dehydration  - Anticipate resolution with gentle IVF hydration  - Repeat chem panel in am  Hypertension -continue verampamil, losartan   Family Communication:  Multiple sons and daughters updated at beside--total time 40 min; >50% spent counseling and coordinating care Disposition Plan:   Home 3/17/ or 3/18 if stable       Procedures/Studies: Dg Chest 2 View  04/10/2015  CLINICAL DATA:  Weakness and shortness of Breath wall being treated for pneumonia. EXAM: CHEST  2 VIEW COMPARISON:  04/05/2015 FINDINGS: AP and lateral views of the chest show increase in volume loss in the right hemi thorax with widening of the right paratracheal stripe and more confluent density along the lateral aspect of the right lung, likely in the upper lobe.  Left lung remains clear. Cardiopericardial silhouette is at upper limits of normal for size. Degenerative changes are noted in the shoulders bilaterally. No evidence for pleural effusion. IMPRESSION: Continued progression of airspace disease in the right lung since 03/18/2015 with increasing right lung volume loss and abnormal right paratracheal  opacity associated with apparent caught off of the right mainstem bronchus. CT chest with contrast recommended to exclude the presence of a central obstructing mass lesion. Electronically Signed   By: Misty Stanley M.D.   On: 04/10/2015 18:20   Dg Chest 2 View  04/05/2015  CLINICAL DATA:  Pneumonia, shortness of breath. EXAM: CHEST  2 VIEW COMPARISON:  March 18, 2015. FINDINGS: Stable cardiomediastinal silhouette. Left lung is clear. No pneumothorax or pleural effusion is noted. Persistent right upper lobe airspace opacity is noted concerning for pneumonia. It appears to be slightly improved. Slight improvement involving right basilar opacity noted on prior exam. IMPRESSION: Slightly improved right upper lobe and basilar opacities concerning for pneumonia. Given the only slight improvement in the right lung opacities compared to prior exam, underlying obstructive mass or neoplasm cannot be excluded, and CT scan of the chest with intravenous contrast is recommended for further evaluation. These results will be called to the ordering clinician or representative by the Radiologist Assistant, and communication documented in the PACS or zVision Dashboard. Electronically Signed   By: Marijo Conception, M.D.   On: 04/05/2015 12:21   Dg Chest 2 View  03/18/2015  CLINICAL DATA:  Wheezing and cough for the past week ; history of right breast malignancy, diabetes common nonsmoker. EXAM: CHEST  2 VIEW COMPARISON:  Portable chest x-ray of August 17, 2014 FINDINGS: There is abnormal alveolar opacity in the right mid lung and in the right lower lung. A portion of this especially inferiorly may be due to postsurgical change but the findings are new since the previous study. The left lung is clear. The heart and pulmonary vascularity are normal. The mediastinum is normal in width. The bony thorax exhibits no acute abnormality. IMPRESSION: Alveolar opacities on the right worrisome for pneumonia. Followup PA and lateral chest  X-ray is recommended in 3-4 weeks following trial of antibiotic therapy to ensure resolution and exclude underlying malignancy. Electronically Signed   By: Zilpha Mcandrew  [place] M.D.   On: 03/18/2015 12:02   Ct Chest W Contrast  04/10/2015  CLINICAL DATA:  Shortness of Breath.  Worsening chest x-ray. EXAM: CT CHEST WITH CONTRAST TECHNIQUE: Multidetector CT imaging of the chest was performed during intravenous contrast administration. CONTRAST:  70m OMNIPAQUE IOHEXOL 300 MG/ML  SOLN COMPARISON:  Chest x-ray from earlier today. FINDINGS: Mediastinum / Lymph Nodes: There is no axillary lymphadenopathy. 3.4 x 5.4 cm nodal conglomeration is identified in the right paratracheal space. 8.0 x 4.3 cm mass centered in the region of the right hilum circumferentially encases the right mainstem bronchus and markedly attenuates the bronchus intermedius. The right upper lobe bronchus is occluded. This abnormal soft tissue circumferentially encases the right upper lobe and right middle lobe pulmonary arteries. Enter lobar pulmonary artery is circumferentially encased and attenuated. 17 mm short axis subcarinal lymph node seen on image 26 series 2. 18 mm paraesophageal short axis lymph node seen on image 16 of series 2. No left hilar lymphadenopathy. The heart size is normal. No pericardial effusion. Coronary artery calcification is noted. Lungs / Pleura: Volume loss noted in the right upper and middle lobes. Confluent airspace opacity in the periphery of the right upper lobe may be  a combination of neoplasm and postobstructive collapse. There is a small right pleural effusion. Upper Abdomen:  Unremarkable. MSK / Soft Tissues: Bone windows reveal no worrisome lytic or sclerotic osseous lesions. IMPRESSION: 1. Large central right lung/hilar mass extends into the mediastinum, circumferentially encasing the right mainstem bronchus and pulmonary arteries to all 3 lobes of the right lung. There is associated mediastinal lymphadenopathy and  probably associated postobstructive collapse from the central lung lesion. Electronically Signed   By: Misty Stanley M.D.   On: 04/10/2015 20:25         Subjective: Patient denies fevers, chills, headache, chest pain, dyspnea, nausea, vomiting, diarrhea, abdominal pain, dysuria, hematuria   Objective: Filed Vitals:   04/10/15 2028 04/10/15 2321 04/11/15 0518 04/11/15 1310  BP: 148/71 145/63 141/74 145/62  Pulse: 95 92 87 89  Temp:  98 F (36.7 C) 98.4 F (36.9 C) 98.9 F (37.2 C)  TempSrc:  Oral Oral Oral  Resp: '20 16 16 18  '$ Height:  '5\' 5"'$  (1.651 m)    Weight:  79.2 kg (174 lb 9.7 oz)    SpO2: 97% 94% 97% 98%    Intake/Output Summary (Last 24 hours) at 04/11/15 1907 Last data filed at 04/11/15 1700  Gross per 24 hour  Intake    835 ml  Output      0 ml  Net    835 ml   Weight change:  Exam:   General:  Pt is alert, follows commands appropriately, not in acute distress  HEENT: No icterus, No thrush, No neck mass, Section/AT  Cardiovascular: RRR, S1/S2, no rubs, no gallops  Respiratory: Upper airway stridor. Good air movement.  Abdomen: Soft/+BS, non tender, non distended, no guarding; no hepatosplenomegaly  Extremities: No edema, No lymphangitis, No petechiae, No rashes, no synovitis; no cyanosis  Data Reviewed: Basic Metabolic Panel:  Recent Labs Lab 04/10/15 1910  NA 132*  K 3.8  CL 100*  CO2 21*  GLUCOSE 130*  BUN 19  CREATININE 0.67  CALCIUM 9.0   Liver Function Tests: No results for input(s): AST, ALT, ALKPHOS, BILITOT, PROT, ALBUMIN in the last 168 hours. No results for input(s): LIPASE, AMYLASE in the last 168 hours. No results for input(s): AMMONIA in the last 168 hours. CBC:  Recent Labs Lab 04/10/15 1910  WBC 14.6*  NEUTROABS 12.4*  HGB 13.4  HCT 39.2  MCV 89.1  PLT 263   Cardiac Enzymes: No results for input(s): CKTOTAL, CKMB, CKMBINDEX, TROPONINI in the last 168 hours. BNP: Invalid input(s): POCBNP CBG:  Recent Labs Lab  04/10/15 2332 04/11/15 0749 04/11/15 1200 04/11/15 1710  GLUCAP 169* 127* 114* 123*    Recent Results (from the past 240 hour(s))  Culture, blood (routine x 2)     Status: None (Preliminary result)   Collection Time: 04/10/15  7:10 PM  Result Value Ref Range Status   Specimen Description BLOOD RIGHT ANTECUBITAL  Final   Special Requests BOTTLES DRAWN AEROBIC AND ANAEROBIC 5CC  Final   Culture   Final    NO GROWTH < 24 HOURS Performed at [facility]    Report Status PENDING  Incomplete     Scheduled Meds: . brimonidine  1 drop Both Eyes BID  . colesevelam  625 mg Oral BID WC  . ezetimibe  10 mg Oral Daily  . heparin  5,000 Units Subcutaneous 3 times per day  . insulin aspart  0-15 Units Subcutaneous TID WC  . latanoprost  1 drop Both Eyes QHS  .  levothyroxine  50 mcg Oral QAC breakfast  . losartan  100 mg Oral Daily  . [START ON 04/12/2015] pneumococcal 23 valent vaccine  0.5 mL Intramuscular Tomorrow-1000  . timolol  1 drop Both Eyes BID  . verapamil  120 mg Oral Daily   Continuous Infusions:    Muneeb Veras, DO  [facility] Pager [phone]  If 7PM-7AM, please contact night-coverage [email or web address] Password [email or web address] 04/11/2015, 7:07 PM   LOS: 1 day

## 2015-04-11 NOTE — Care Management Obs Status (Signed)
[facility] NOTIFICATION   Patient Details  Name: Madison Fry MRN: [id number] Date of Birth: 09-Jul-1928   Medicare Observation Status Notification Given:  Yes    Lynnell Catalan, RN 04/11/2015, 2:41 PM

## 2015-04-11 NOTE — Consult Note (Signed)
Name: Madison Fry MRN: [id number] DOB: 02/28/1928    ADMISSION DATE:  04/10/2015 CONSULTATION DATE:  04/11/15  REFERRING MD :  Dr. Carles Collet   CHIEF COMPLAINT:  Lung Mass    HISTORY OF PRESENT ILLNESS:  82 y/o F with PMH of DM, HTN, diverticulum s/p partial colectomy,  breast cancer s/p completion of Tamoxifen who presented [facility] 3/15 with complaints of shortness of breath.    The patient reports she has been experiencing SOB x1 month with intermittent cough and wheezing.  She was seen by her PCP with a CXR concerning for PNA and was prescribed Avelox x7days.  She returned to her PCP for follow up CXR which was concerning for PNA. The patients family insisted she be further evaluated and she sought care in the ER.  The patient denies hemoptysis, sputum production, fevers, chills, nausea, vomiting, chest pain.  She indicates the sensation of need to cough / produce sputum but inability to do so. She reports approximately 5lb weight loss in last month.  Appetite remains intact.  She has noted occasional dark stool.  Of note, she occasionally takes pepto for indigestion which also makes her stool dark.   PAST MEDICAL HISTORY :   has a past medical history of Diabetes mellitus without complication ([place]); Hypertension; and Cancer ([place]).  has no past surgical history on file.   Prior to Admission medications   Medication Sig Start Date End Date Taking? Authorizing Provider  colesevelam (WELCHOL) 625 MG tablet Take by mouth 2 (two) times daily with a meal.   Yes Historical Provider, MD  COMBIGAN 0.2-0.5 % ophthalmic solution PUT 1 DROP IN AFFECTED EYE EVERY 12 HOURS**NEED OFFICE VISIT** 03/08/15  Yes Historical Provider, MD  ezetimibe (ZETIA) 10 MG tablet Take 10 mg by mouth daily.   Yes Historical Provider, MD  glimepiride (AMARYL) 2 MG tablet Take 2 mg by mouth daily with breakfast.   Yes Historical Provider, MD  HYDROcodone-acetaminophen (NORCO/VICODIN) 5-325 MG tablet TAKE 1-2 TABLETS BY  MOUTH EVERY 8 HOURS AS NEEDED FOR PAIN 11/06/14  Yes Historical Provider, MD  levothyroxine (SYNTHROID, LEVOTHROID) 50 MCG tablet Take 50 mcg by mouth daily.   Yes Historical Provider, MD  losartan (COZAAR) 100 MG tablet Take 100 mg by mouth daily.   Yes Historical Provider, MD  metFORMIN (GLUCOPHAGE-XR) 500 MG 24 hr tablet TAKE 1 TABLET BY MOUTH WITH MEALS 11/05/14  Yes Historical Provider, MD  moxifloxacin (AVELOX) 400 MG tablet Take 400 mg by mouth daily at 8 pm. ABT Start Date 04/05/15 & End Date 04/11/15. For 7 Days.   Yes Historical Provider, MD  TRAVATAN Z 0.004 % SOLN ophthalmic solution PUT 1 DROP INTO BOTH EYES AT BEDTIME 03/16/15  Yes Historical Provider, MD  verapamil (CALAN-SR) 120 MG CR tablet Take 120 mg by mouth daily.   Yes Historical Provider, MD  cephALEXin (KEFLEX) 500 MG capsule Take 1 capsule (500 mg total) by mouth 2 (two) times daily. Patient not taking: Reported on 04/10/2015 08/20/14   Epifanio Lesches, MD  tamoxifen (NOLVADEX) 20 MG tablet Take 1 tablet (20 mg total) by mouth daily. Patient not taking: Reported on 04/10/2015 07/23/14   Lloyd Huger, MD   Allergies  Allergen Reactions  . Shellfish Allergy Swelling    FAMILY HISTORY:  family history is not on file.   SOCIAL HISTORY:  reports that she has never smoked. She has never used smokeless tobacco. She reports that she does not drink alcohol.  REVIEW OF  SYSTEMS:   Constitutional: Negative for fever, chills, weight loss, malaise/fatigue and diaphoresis.  HENT: Negative for hearing loss, ear pain, nosebleeds, congestion, sore throat, neck pain, tinnitus and ear discharge.   Eyes: Negative for blurred vision, double vision, photophobia, pain, discharge and redness.  Respiratory: Negative for hemoptysis, and stridor.  Reports cough, sputum production, shortness of breath, wheezing  Cardiovascular: Negative for chest pain, palpitations, orthopnea, claudication, leg swelling and PND.  Gastrointestinal:  Negative for heartburn, nausea, vomiting, abdominal pain, diarrhea, constipation, blood in stool and melena.  Genitourinary: Negative for dysuria, urgency, frequency, hematuria and flank pain.  Musculoskeletal: Negative for myalgias, back pain, joint pain and falls.  Skin: Negative for itching and rash.  Neurological: Negative for dizziness, tingling, tremors, sensory change, speech change, focal weakness, seizures, loss of consciousness, weakness and headaches.  Endo/Heme/Allergies: Negative for environmental allergies and polydipsia. Does not bruise/bleed easily.  SUBJECTIVE:   VITAL SIGNS: Temp:  [97.9 F (36.6 C)-98.4 F (36.9 C)] 98.4 F (36.9 C) (03/16 0518) Pulse Rate:  [87-104] 87 (03/16 0518) Resp:  [16-20] 16 (03/16 0518) BP: (141-168)/(63-83) 141/74 mmHg (03/16 0518) SpO2:  [94 %-97 %] 97 % (03/16 0518) Weight:  [174 lb 9.7 oz (79.2 kg)-175 lb (79.379 kg)] 174 lb 9.7 oz (79.2 kg) (03/15 2321)  PHYSICAL EXAMINATION: General:  wdwn elderly female in NAD  Neuro:  AAOx4, speech clear, MAE HEENT:  MM pink/moist, no jvd, no LAN Cardiovascular:  s1s2 rrr, no m/r/g Lungs:  Even/non-labored, lungs bilaterally with wheezing on L, referred wheeze on R Abdomen:  Obese/soft, bsx4 active  Musculoskeletal:  No acute deformities Skin:  Warm/dry, no edema    Recent Labs Lab 04/10/15 1910  NA 132*  K 3.8  CL 100*  CO2 21*  BUN 19  CREATININE 0.67  GLUCOSE 130*    Recent Labs Lab 04/10/15 1910  HGB 13.4  HCT 39.2  WBC 14.6*  PLT 263   Dg Chest 2 View  04/10/2015  CLINICAL DATA:  Weakness and shortness of Breath wall being treated for pneumonia. EXAM: CHEST  2 VIEW COMPARISON:  04/05/2015 FINDINGS: AP and lateral views of the chest show increase in volume loss in the right hemi thorax with widening of the right paratracheal stripe and more confluent density along the lateral aspect of the right lung, likely in the upper lobe. Left lung remains clear. Cardiopericardial  silhouette is at upper limits of normal for size. Degenerative changes are noted in the shoulders bilaterally. No evidence for pleural effusion. IMPRESSION: Continued progression of airspace disease in the right lung since 03/18/2015 with increasing right lung volume loss and abnormal right paratracheal opacity associated with apparent caught off of the right mainstem bronchus. CT chest with contrast recommended to exclude the presence of a central obstructing mass lesion. Electronically Signed   By: Misty Stanley M.D.   On: 04/10/2015 18:20   Ct Chest W Contrast  04/10/2015  CLINICAL DATA:  Shortness of Breath.  Worsening chest x-ray. EXAM: CT CHEST WITH CONTRAST TECHNIQUE: Multidetector CT imaging of the chest was performed during intravenous contrast administration. CONTRAST:  9m OMNIPAQUE IOHEXOL 300 MG/ML  SOLN COMPARISON:  Chest x-ray from earlier today. FINDINGS: Mediastinum / Lymph Nodes: There is no axillary lymphadenopathy. 3.4 x 5.4 cm nodal conglomeration is identified in the right paratracheal space. 8.0 x 4.3 cm mass centered in the region of the right hilum circumferentially encases the right mainstem bronchus and markedly attenuates the bronchus intermedius. The right upper lobe bronchus is occluded. This  abnormal soft tissue circumferentially encases the right upper lobe and right middle lobe pulmonary arteries. Enter lobar pulmonary artery is circumferentially encased and attenuated. 17 mm short axis subcarinal lymph node seen on image 26 series 2. 18 mm paraesophageal short axis lymph node seen on image 16 of series 2. No left hilar lymphadenopathy. The heart size is normal. No pericardial effusion. Coronary artery calcification is noted. Lungs / Pleura: Volume loss noted in the right upper and middle lobes. Confluent airspace opacity in the periphery of the right upper lobe may be a combination of neoplasm and postobstructive collapse. There is a small right pleural effusion. Upper Abdomen:   Unremarkable. MSK / Soft Tissues: Bone windows reveal no worrisome lytic or sclerotic osseous lesions. IMPRESSION: 1. Large central right lung/hilar mass extends into the mediastinum, circumferentially encasing the right mainstem bronchus and pulmonary arteries to all 3 lobes of the right lung. There is associated mediastinal lymphadenopathy and probably associated postobstructive collapse from the central lung lesion. Electronically Signed   By: Misty Stanley M.D.   On: 04/10/2015 20:25    SIGNIFICANT EVENTS  3/15  Admit with SOB, outpatient failure to abx for 'PNA', CT chest with large mass   STUDIES:  3/15 CT Chest >> large central right lung/hilar mass that extends into the mediastinum, encasing the R mainstem bronchus & pulmonary arteries to all 3 lobes of the R lung, associated lymphadenopathy, postobstructive collapse  ANTIBIOTICS:  CULTURES:  BCx2 3/15 >>   ASSESSMENT / PLAN:  Right Lung Mass - large on CT, encases the right mainstem with significant airway compression / possible endobronchial lesion & pulmonary arteries.  Concerning for malignancy.   Right Pleural Effusion - concern malignant, small on CT, ? If enough to safely sample   Dyspnea - secondary to above  Hx Tobacco Abuse - quit 1992, 20 year hx, 1ppd  Discussion:  Extensive discussion with patient and daughter regarding CT findings, reviewed images with patient / family.  Discussed process for potential tissue sampling.  Reviewed EBUS process and no diagnosis until tissue obtained.  The patient indicates she has remained very active thus far and would not want therapies that would interfere with her independence (ie make her sicker due to side effects).  She specifically states she would not want chemotherapy.  However, she is open to treatments that would aide in improving quality of life.  She is open to the idea of symptom treatment / palliative XRT if a candidate.  Informed them that this is most likely a malignancy  but can not be confirmed until tissue sampling.  PLAN: Tentatively plan for EBUS for tissue sampling, PCCM will arrange Will need ONC evaluation once diagnosis confirmed, possibly RAD ONC Duonebs PRN - doubt will help with dyspnea PRN ativan for anxiety  Will consider assessment of pleural space with [place]. Consider early palliative care involvement   Attending to follow.    Noe Gens, NP-C [facility] Pgr: [phone] or if no answer [phone] 04/11/2015, 10:45 AM

## 2015-04-12 ENCOUNTER — Encounter (HOSPITAL_COMMUNITY)
Admission: EM | Disposition: A | Discharge status: Home / Self Care | Payer: Self-pay | Source: Home / Self Care | Attending: Emergency Medicine

## 2015-04-12 ENCOUNTER — Observation Stay (HOSPITAL_COMMUNITY): Payer: Commercial Managed Care - HMO

## 2015-04-12 ENCOUNTER — Inpatient Hospital Stay: Admission: RE | Admit: 2015-04-12 | Payer: Commercial Managed Care - HMO | Source: Ambulatory Visit

## 2015-04-12 ENCOUNTER — Encounter (HOSPITAL_COMMUNITY): Payer: Self-pay | Admitting: Pulmonary Disease

## 2015-04-12 DIAGNOSIS — R222 Localized swelling, mass and lump, trunk: Secondary | ICD-10-CM | POA: Diagnosis not present

## 2015-04-12 DIAGNOSIS — D72829 Elevated white blood cell count, unspecified: Secondary | ICD-10-CM | POA: Diagnosis not present

## 2015-04-12 DIAGNOSIS — F419 Anxiety disorder, unspecified: Secondary | ICD-10-CM

## 2015-04-12 DIAGNOSIS — R0600 Dyspnea, unspecified: Secondary | ICD-10-CM | POA: Diagnosis not present

## 2015-04-12 DIAGNOSIS — E871 Hypo-osmolality and hyponatremia: Secondary | ICD-10-CM | POA: Diagnosis not present

## 2015-04-12 HISTORY — PX: VIDEO BRONCHOSCOPY: SHX5072

## 2015-04-12 LAB — COMPREHENSIVE METABOLIC PANEL
ALT: 12 U/L — AB (ref 14–54)
AST: 15 U/L (ref 15–41)
Albumin: 3.2 g/dL — ABNORMAL LOW (ref 3.5–5.0)
Alkaline Phosphatase: 55 U/L (ref 38–126)
Anion gap: 8 (ref 5–15)
BILIRUBIN TOTAL: 0.7 mg/dL (ref 0.3–1.2)
BUN: 18 mg/dL (ref 6–20)
CHLORIDE: 97 mmol/L — AB (ref 101–111)
CO2: 23 mmol/L (ref 22–32)
CREATININE: 0.64 mg/dL (ref 0.44–1.00)
Calcium: 8.5 mg/dL — ABNORMAL LOW (ref 8.9–10.3)
GFR calc Af Amer: >60 mL/min (ref >60)
Glucose, Bld: 119 mg/dL — ABNORMAL HIGH (ref 65–99)
Potassium: 3.9 mmol/L (ref 3.5–5.1)
Sodium: 128 mmol/L — ABNORMAL LOW (ref 135–145)
TOTAL PROTEIN: 6.1 g/dL — AB (ref 6.5–8.1)

## 2015-04-12 LAB — CBC
HCT: 35.2 % — ABNORMAL LOW (ref 36.0–46.0)
Hemoglobin: 12.4 g/dL (ref 12.0–15.0)
MCH: 31.2 pg (ref 26.0–34.0)
MCHC: 35.2 g/dL (ref 30.0–36.0)
MCV: 88.4 fL (ref 78.0–100.0)
PLATELETS: 227 10*3/uL (ref 150–400)
RBC: 3.98 MIL/uL (ref 3.87–5.11)
RDW: 13.5 % (ref 11.5–15.5)
WBC: 8.8 10*3/uL (ref 4.0–10.5)

## 2015-04-12 LAB — GLUCOSE, CAPILLARY
Glucose-Capillary: 111 mg/dL — ABNORMAL HIGH (ref 65–99)
Glucose-Capillary: 100 mg/dL — ABNORMAL HIGH (ref 65–99)

## 2015-04-12 LAB — HEMOGLOBIN A1C
HEMOGLOBIN A1C: 6.1 % — AB (ref 4.8–5.6)
Mean Plasma Glucose: 128 mg/dL

## 2015-04-12 LAB — PROCALCITONIN: Procalcitonin: 0.2 ng/mL

## 2015-04-12 SURGERY — BRONCHOSCOPY, WITH FLUOROSCOPY
Anesthesia: Moderate Sedation | Laterality: Bilateral

## 2015-04-12 MED ORDER — FENTANYL CITRATE (PF) 100 MCG/2ML IJ SOLN
INTRAMUSCULAR | Status: DC | PRN
  Administered 2015-04-12: 50 ug via INTRAVENOUS
  Administered 2015-04-12: 25 ug via INTRAVENOUS

## 2015-04-12 MED ORDER — AMOXICILLIN-POT CLAVULANATE 875-125 MG PO TABS
1.0000 | ORAL_TABLET | Freq: Two times a day (BID) | ORAL | Status: DC
  Administered 2015-04-12: 1 via ORAL
  Filled 2015-04-12: qty 1

## 2015-04-12 MED ORDER — PHENYLEPHRINE HCL 0.25 % NA SOLN
NASAL | Status: DC | PRN
  Administered 2015-04-12: 2 via NASAL

## 2015-04-12 MED ORDER — MIDAZOLAM HCL 5 MG/ML IJ SOLN
INTRAMUSCULAR | Status: AC
  Filled 2015-04-12: qty 2

## 2015-04-12 MED ORDER — LIDOCAINE HCL (PF) 1 % IJ SOLN: INTRAMUSCULAR | Status: DC | PRN

## 2015-04-12 MED ORDER — AMOXICILLIN-POT CLAVULANATE 875-125 MG PO TABS: 1.0000 | ORAL_TABLET | Freq: Two times a day (BID) | ORAL | Status: DC

## 2015-04-12 MED ORDER — SODIUM CHLORIDE 0.9 % IV SOLN
INTRAVENOUS | Status: DC
  Administered 2015-04-12: 09:00:00 via INTRAVENOUS

## 2015-04-12 MED ORDER — FENTANYL CITRATE (PF) 100 MCG/2ML IJ SOLN
INTRAMUSCULAR | Status: AC
  Filled 2015-04-12: qty 4

## 2015-04-12 MED ORDER — LIDOCAINE HCL 2 % EX GEL
1.0000 "application " | Freq: Once | CUTANEOUS | Status: DC
  Filled 2015-04-12: qty 5

## 2015-04-12 MED ORDER — MIDAZOLAM HCL 10 MG/2ML IJ SOLN
INTRAMUSCULAR | Status: DC | PRN
  Administered 2015-04-12: 2 mg via INTRAVENOUS
  Administered 2015-04-12: 1 mg via INTRAVENOUS

## 2015-04-12 MED ORDER — PHENYLEPHRINE HCL 0.25 % NA SOLN
1.0000 | Freq: Four times a day (QID) | NASAL | Status: DC | PRN
  Filled 2015-04-12: qty 15

## 2015-04-12 MED ORDER — LIDOCAINE HCL 2 % EX GEL
CUTANEOUS | Status: DC | PRN
  Administered 2015-04-12: 1

## 2015-04-12 MED ORDER — BUTAMBEN-TETRACAINE-BENZOCAINE 2-2-14 % EX AERO
1.0000 | INHALATION_SPRAY | Freq: Once | CUTANEOUS | Status: DC
  Filled 2015-04-12: qty 20

## 2015-04-12 MED ORDER — LIDOCAINE HCL 1 % IJ SOLN
INTRAMUSCULAR | Status: DC | PRN
  Administered 2015-04-12: 6 mL via RESPIRATORY_TRACT

## 2015-04-12 NOTE — H&P (Signed)
  [facility]  HPI: Ms. Nugent has a history of breast cancer and was found to have a new right upper lobe mass on CT chest recently.  She is here for a bronchoscopy today.  Past Medical History  Diagnosis Date  . Diabetes mellitus without complication ([place])   . Hypertension   . Cancer [facility])     breast cancer, Right side lymph nodes removed     History reviewed. No pertinent family history.   Social History   Social History  . Marital Status: Widowed    Spouse Name: N/A  . Number of Children: N/A  . Years of Education: N/A   Occupational History  . Not on file.   Social History Main Topics  . Smoking status: Never Smoker   . Smokeless tobacco: Never Used  . Alcohol Use: No  . Drug Use: Not on file  . Sexual Activity: No   Other Topics Concern  . Not on file   Social History Narrative     Allergies  Allergen Reactions  . Shellfish Allergy Swelling     '@encmedstart'$ @ . Filed Vitals:   04/11/15 1310 04/11/15 2106 04/12/15 0625 04/12/15 0844  BP: 145/62 121/52 142/68   Pulse: 89 78 79   Temp: 98.9 F (37.2 C) 98.9 F (37.2 C) 98.8 F (37.1 C) 98.5 F (36.9 C)  TempSrc: Oral Oral Oral Oral  Resp: '18 18 18   '$ Height:      Weight:      SpO2: 98% 94% 94%    Gen: well appearing HENT: OP clear, neck supple PULM: CTA B, normal percussion CV: RRR, no mgr, trace edema GI: BS+, soft, nontender Derm: no cyanosis or rash Psyche: normal mood and affect  CBC    Component Value Date/Time   WBC 8.8 04/12/2015 0325   WBC 8.0 05/07/2011 1053   WBC 8.2 by slide est 09/04/2009 1449   RBC 3.98 04/12/2015 0325   RBC 4.11 05/07/2011 1053   RBC 4.61 09/04/2009 1449   HGB 12.4 04/12/2015 0325   HGB 13.2 05/07/2011 1053   HGB 13.5 09/04/2009 1449   HCT 35.2* 04/12/2015 0325   HCT 38.9 05/07/2011 1053   HCT 39.9 09/04/2009 1449   PLT 227 04/12/2015 0325   PLT 255 05/07/2011 1053   PLT 266 09/04/2009 1449   MCV 88.4 04/12/2015 0325   MCV 95 05/07/2011 1053   MCV 86  09/04/2009 1449   MCH 31.2 04/12/2015 0325   MCH 32.2 05/07/2011 1053   MCH 29.2 09/04/2009 1449   MCHC 35.2 04/12/2015 0325   MCHC 34.0 05/07/2011 1053   MCHC 33.8 09/04/2009 1449   RDW 13.5 04/12/2015 0325   RDW 14.3 05/07/2011 1053   RDW 14.2 09/04/2009 1449   LYMPHSABS 1.4 04/10/2015 1910   LYMPHSABS 1.4 05/07/2011 1053   MONOABS 0.8 04/10/2015 1910   MONOABS 0.5 05/07/2011 1053   EOSABS 0.1 04/10/2015 1910   EOSABS 0.1 05/07/2011 1053   BASOSABS 0.0 04/10/2015 1910   BASOSABS 0.0 05/07/2011 1053   CT chest > large mediastinal lymph node and endobronchial lesion RUL  Impression: Lung mass Plan: Bronchoscopy with moderate sedation  Roselie Awkward, MD [facility] Pager: [phone] Cell: [phone] After 3pm or if no response, call [phone]

## 2015-04-12 NOTE — Progress Notes (Signed)
Video Bronchoscopy done  Intervention Bronchial washing Intervention Bronchial biopsy Intervention Bronchial brushing done

## 2015-04-12 NOTE — Op Note (Signed)
[facility] Cardiopulmonary Patient Name: Madison Fry Procedure Date: 04/12/2015 MRN: [id number] Attending MD: Juanito Doom , MD Date of Birth: 1928-09-08 CSN: [id number] Age: 82 Admit Type: Inpatient Ethnicity: Not Hispanic or Latino Procedure:            Bronchoscopy Indications:          Right upper lobe mass Providers:            Nathaneil Canary B. Lake Bells, MD, Andre Lefort RRT,RCP, Cherre Huger RRT, RCP Referring MD:          Medicines:            Fentanyl 75 mcg IV, Midazolam 3 mg mg IV, Lidocaine 1%                        applied to the tracheobronchial tree 6 mL, Lidocaine 1%                        applied to cords 6 mL Complications:        No immediate complications Estimated Blood Loss: Estimated blood loss was minimal. Procedure:      Pre-Anesthesia Assessment:      - A History and Physical has been performed. Patient meds and allergies       have been reviewed. The risks and benefits of the procedure and the       sedation options and risks were discussed with the patient. All       questions were answered and informed consent was obtained. Patient       identification and proposed procedure were verified prior to the       procedure by the physician in the procedure room. Mental Status       Examination: alert and oriented. Airway Examination: normal       oropharyngeal airway. Respiratory Examination: clear to auscultation. CV       Examination: normal. ASA Grade Assessment: II - A patient with mild       systemic disease. After reviewing the risks and benefits, the patient       was deemed in satisfactory condition to undergo the procedure. The       anesthesia plan was to use moderate sedation / analgesia (conscious       sedation). Immediately prior to administration of medications, the       patient was re-assessed for adequacy to receive sedatives. The heart       rate, respiratory rate, oxygen saturations, blood  pressure, adequacy of       pulmonary ventilation, and response to care were monitored throughout       the procedure. The physical status of the patient was re-assessed after       the procedure.      After obtaining informed consent, the bronchoscope was passed under       direct vision. Throughout the procedure, the patient's blood pressure,       pulse, and oxygen saturations were monitored continuously. the [id number]       ([id number]) scope was introduced through the mouth and advanced to the       tracheobronchial tree of both lungs. The procedure was accomplished       without difficulty. The patient tolerated the procedure fairly well. The  total duration of the procedure was 13 minutes. Findings:      The nasopharynx/oropharynx appears normal. The larynx appears normal.       The vocal cords appear normal. The subglottic space is normal. The       trachea is of normal caliber. The carina is sharp. The tracheobronchial       tree of the left lung was examined to at least the first subsegmental       level. Bronchial mucosa and anatomy in the left lung are normal; there       are no endobronchial lesions, and no secretions.      Right Lung Abnormalities: A partially obstructing mass was found 3 cm       from the bifurcation (carina) in the bronchus intermedius and in the       right upper lobe. The mass was large and infiltrative and polypoid. The       lesion was successfully traversed into the bronchus intermedius but the       right upper lobe was completely obstructed. Endobronchial biopsies were       performed in the bronchus intermedius and in the right upper lobe using       forceps and sent for histopathology examination. Four samples were       obtained. Brushings were obtained in the bronchus intermedius and in the       right upper lobe and sent for routine cytology. Two samples were       obtained. Washings were obtained in the bronchus intermedius and in the        right upper lobe and sent for routine cytology. The return was bloody. Impression:      - Right upper lobe mass      - The left lung was normal.      - An infiltrative and polypoid mass was found in the bronchus       intermedius and in the right upper lobe. This lesion is malignant.      - An endobronchial biopsy was performed.      - Brushings were obtained.      - Washings were obtained. Moderate Sedation:      Moderate (conscious) sedation was personally administered by the       endoscopist. The following parameter was monitored: oxygen saturation.       Total physician intraservice time was 19 minutes. Recommendation:      - Await biopsy, brushing and cytology results. Procedure Code(s):      --- Professional ---      [phone], Bronchoscopy, rigid or flexible, including fluoroscopic guidance,       when performed; with bronchial or endobronchial biopsy(s), single or       multiple sites      31623, Bronchoscopy, rigid or flexible, including fluoroscopic guidance,       when performed; with brushing or protected brushings      99152, Moderate sedation services provided by the same physician or       other qualified health care professional performing the diagnostic or       therapeutic service that the sedation supports, requiring the presence       of an independent trained observer to assist in the monitoring of the       patient's level of consciousness and physiological status; initial 15       minutes of intraservice time, patient age 41 years or older Diagnosis Code(s):      ---  Professional ---      R91.8, Other nonspecific abnormal finding of lung field      C34.00, Malignant neoplasm of unspecified main bronchus      C34.11, Malignant neoplasm of upper lobe, right bronchus or lung CPT copyright 2016 American Medical Association. All rights reserved. The codes documented in this report are preliminary and upon coder review may  be revised to meet current compliance  requirements. Norlene Campbell, MD Juanito Doom, MD 04/12/2015 9:57:38 AM This report has been signed electronically. Number of Addenda: 0 Scope In: 9:30:39 AM Scope Out: 9:43:59 AM

## 2015-04-12 NOTE — Progress Notes (Signed)
[facility]  I discussed the situation with Ms. William's granddaughter today who used to work with [place] in the office in [place] [facility].  I showed her the CT scan findings and discussed my bronchoscopy today.  She voiced understanding.   I explained that the mass appeared malignant but it will take a few days for the special stains to come back to determine if it is breast of lung origin.  She voiced understanding and requested that we arrange f/u with pulmonary in [place] as well.  Have arranged a f/u with Dr. Ashby Dawes on 3/31 at 11:00.  I will also try to reach out to Dr. Grayland Ormond to discuss the results of the bronchoscopy as well.   Roselie Awkward, MD [facility] Pager: [phone] Cell: [phone] After 3pm or if no response, call [phone]

## 2015-04-12 NOTE — Discharge Summary (Signed)
Physician Discharge Summary  MEDIA PIZZINI WER:[id number] DOB: [birth year] DOA: 04/10/2015  PCP: Mayra Neer, MD  Admit date: 04/10/2015 Discharge date: 04/12/2015  Recommendations for Outpatient Follow-up:  1. Pt will need to follow up with PCP in 1-2 weeks post discharge 2. Please obtain BMP in 1 week  Discharge Diagnoses:  Right hilar mass  - Mass seen on CXR in February with PNA suspected at that time, seen again on outpt CXR from 04/05/15 and CT was recommended by radiology - She has been on Avelox since 04/05/15 with no appreciable improvement  - 04/10/15--CT chest as described above -Pulmonary consult-->discussed with Dr. Earnest Bailey for bronchoscopy 04/12/15 04/12/2015--bronchoscopy with biopsies performed by Dr. Lake Bells -Case was discussed with Dr. Lake Bells on the day of discharge--> recommended antibiotics for 1 week due to concerns for infectious process post obstructively from the right lung mass -Home with Augmentin x 1 week  Dyspnea/stridor - Suspected secondary to #1  - She had a little improvement with neb treatment in ED, will continue q4h prn  - Saturating well on room air  - resolved, improved   Anxiety  - Pt understandably quite anxious about the current situation involving new mass  - She asks for "something for my nerves," will trial Ativan 0.5 mg PO q4h prn  - Pt's daughter at bedside for support   Leukocytosis  - WBC 14.6k on admission-->improved - remained afebrile and medically stable  - Suspect this is secondary to malignancy rather than infection  - Blood cultures are neg to date  - Check procalcitonin--0.19  Type II DM  - Hold home metformin and glimepiride while inpt -->restart after d/c - Check CBG with meals and qHS  - Moderate-intensity SSI correctional for now, adjust regimen prn  -04/10/2015 Hemoglobin A1c 6.1  Hyponatremia, hypochloremia  - Suspected secondary to mild dehydration and poor solute intake - pt is  asymptomatic and does not want to stay in hospital for further fluids - I asked pt to f/u with PCP for BMP in 1 week  Hypertension -continue verampamil, losartan  Discharge Condition: stable  Disposition: home  Diet:carb modified Wt Readings from Last 3 Encounters:  04/10/15 79.2 kg (174 lb 9.7 oz)  01/15/15 78.5 kg (173 lb 1 oz)  08/17/14 80.241 kg (176 lb 14.4 oz)    History of present illness:  82 year old female with a history of diabetes mellitus type 2, hypertension, hyperlipidemia, right breast cancer status post local resection and lymph node dissection who presents to the ED with 1 month of dyspnea. Patient has been undergoing evaluation by her primary care physician for this and recently had a chest x-ray with findings suspicious for pneumonia. She was given IM steroid in the office and prescribed seven-day course of Avelox, of which there is one day remaining. She noted some mild improvement in symptoms for 1-2 days after receiving the steroid and starting antibiotics, but then notes reworsening and persistent dyspnea since that time. She denies fevers or chills, and denies chest pain or palpitations. There's been no rhinorrhea or sore throat and she denies sick contacts or long distance travel. She saw her oncologist in December 2016 and appeared to be doing well at that time and he just completed her 5 years of tamoxifen. In ED, patient was found to be afebrile, saturating well on room air, and with vital signs stable. CT of the chest revealed an 8.0 x 4.3 cm right hilar mass encasing the right main stem bronchus. There was also a soft tissue mass  which encased the pulmonary artery to all 3 right lung lobes. There is an associated postobstructive collapse but no findings particularly suggestive of infection. Mediastinal lymphadenopathy is also noted on this study.  Consultants: Pulm--Dr. Lake Bells  Discharge Exam: Filed Vitals:   04/12/15 1015 04/12/15 1020  BP: 151/64   Pulse:     Temp:    Resp: 38 28   Filed Vitals:   04/12/15 1005 04/12/15 1010 04/12/15 1015 04/12/15 1020  BP: 123/61 123/60 151/64   Pulse:      Temp:      TempSrc:      Resp: 20 24 38 28  Height:      Weight:      SpO2: 93% 94% 93% 94%   General: A&O x 3, NAD, pleasant, cooperative Cardiovascular: RRR, no rub, no gallop, no S3 Respiratory: Bibasilar rales without wheezing. Good air movement. Abdomen:soft, nontender, nondistended, positive bowel sounds Extremities: No edema, No lymphangitis, no petechiae  Discharge Instructions      Discharge Instructions    Diet - low sodium heart healthy    Complete by:  As directed      Increase activity slowly    Complete by:  As directed             Medication List    STOP taking these medications        cephALEXin 500 MG capsule  Commonly known as:  KEFLEX     moxifloxacin 400 MG tablet  Commonly known as:  AVELOX     tamoxifen 20 MG tablet  Commonly known as:  NOLVADEX      TAKE these medications        amoxicillin-clavulanate 875-125 MG tablet  Commonly known as:  AUGMENTIN  Take 1 tablet by mouth every 12 (twelve) hours.     colesevelam 625 MG tablet  Commonly known as:  WELCHOL  Take by mouth 2 (two) times daily with a meal.     COMBIGAN 0.2-0.5 % ophthalmic solution  Generic drug:  brimonidine-timolol  PUT 1 DROP IN AFFECTED EYE EVERY 12 HOURS**NEED OFFICE VISIT**     ezetimibe 10 MG tablet  Commonly known as:  ZETIA  Take 10 mg by mouth daily.     glimepiride 2 MG tablet  Commonly known as:  AMARYL  Take 2 mg by mouth daily with breakfast.     HYDROcodone-acetaminophen 5-325 MG tablet  Commonly known as:  NORCO/VICODIN  TAKE 1-2 TABLETS BY MOUTH EVERY 8 HOURS AS NEEDED FOR PAIN     levothyroxine 50 MCG tablet  Commonly known as:  SYNTHROID, LEVOTHROID  Take 50 mcg by mouth daily.     losartan 100 MG tablet  Commonly known as:  COZAAR  Take 100 mg by mouth daily.     metFORMIN 500 MG 24 hr tablet    Commonly known as:  GLUCOPHAGE-XR  TAKE 1 TABLET BY MOUTH WITH MEALS     TRAVATAN Z 0.004 % Soln ophthalmic solution  Generic drug:  Travoprost (BAK Free)  PUT 1 DROP INTO BOTH EYES AT BEDTIME     verapamil 120 MG CR tablet  Commonly known as:  CALAN-SR  Take 120 mg by mouth daily.         The results of significant diagnostics from this hospitalization (including imaging, microbiology, ancillary and laboratory) are listed below for reference.    Significant Diagnostic Studies: Dg Chest 2 View  04/10/2015  CLINICAL DATA:  Weakness and shortness of Breath wall being treated for  pneumonia. EXAM: CHEST  2 VIEW COMPARISON:  04/05/2015 FINDINGS: AP and lateral views of the chest show increase in volume loss in the right hemi thorax with widening of the right paratracheal stripe and more confluent density along the lateral aspect of the right lung, likely in the upper lobe. Left lung remains clear. Cardiopericardial silhouette is at upper limits of normal for size. Degenerative changes are noted in the shoulders bilaterally. No evidence for pleural effusion. IMPRESSION: Continued progression of airspace disease in the right lung since 03/18/2015 with increasing right lung volume loss and abnormal right paratracheal opacity associated with apparent caught off of the right mainstem bronchus. CT chest with contrast recommended to exclude the presence of a central obstructing mass lesion. Electronically Signed   By: Misty Stanley M.D.   On: 04/10/2015 18:20   Dg Chest 2 View  04/05/2015  CLINICAL DATA:  Pneumonia, shortness of breath. EXAM: CHEST  2 VIEW COMPARISON:  March 18, 2015. FINDINGS: Stable cardiomediastinal silhouette. Left lung is clear. No pneumothorax or pleural effusion is noted. Persistent right upper lobe airspace opacity is noted concerning for pneumonia. It appears to be slightly improved. Slight improvement involving right basilar opacity noted on prior exam. IMPRESSION: Slightly  improved right upper lobe and basilar opacities concerning for pneumonia. Given the only slight improvement in the right lung opacities compared to prior exam, underlying obstructive mass or neoplasm cannot be excluded, and CT scan of the chest with intravenous contrast is recommended for further evaluation. These results will be called to the ordering clinician or representative by the Radiologist Assistant, and communication documented in the PACS or zVision Dashboard. Electronically Signed   By: Marijo Conception, M.D.   On: 04/05/2015 12:21   Dg Chest 2 View  03/18/2015  CLINICAL DATA:  Wheezing and cough for the past week ; history of right breast malignancy, diabetes common nonsmoker. EXAM: CHEST  2 VIEW COMPARISON:  Portable chest x-ray of August 17, 2014 FINDINGS: There is abnormal alveolar opacity in the right mid lung and in the right lower lung. A portion of this especially inferiorly may be due to postsurgical change but the findings are new since the previous study. The left lung is clear. The heart and pulmonary vascularity are normal. The mediastinum is normal in width. The bony thorax exhibits no acute abnormality. IMPRESSION: Alveolar opacities on the right worrisome for pneumonia. Followup PA and lateral chest X-ray is recommended in 3-4 weeks following trial of antibiotic therapy to ensure resolution and exclude underlying malignancy. Electronically Signed   By: Sly Parlee  [place] M.D.   On: 03/18/2015 12:02   Ct Chest W Contrast  04/10/2015  CLINICAL DATA:  Shortness of Breath.  Worsening chest x-ray. EXAM: CT CHEST WITH CONTRAST TECHNIQUE: Multidetector CT imaging of the chest was performed during intravenous contrast administration. CONTRAST:  10m OMNIPAQUE IOHEXOL 300 MG/ML  SOLN COMPARISON:  Chest x-ray from earlier today. FINDINGS: Mediastinum / Lymph Nodes: There is no axillary lymphadenopathy. 3.4 x 5.4 cm nodal conglomeration is identified in the right paratracheal space. 8.0 x 4.3 cm mass  centered in the region of the right hilum circumferentially encases the right mainstem bronchus and markedly attenuates the bronchus intermedius. The right upper lobe bronchus is occluded. This abnormal soft tissue circumferentially encases the right upper lobe and right middle lobe pulmonary arteries. Enter lobar pulmonary artery is circumferentially encased and attenuated. 17 mm short axis subcarinal lymph node seen on image 26 series 2. 18 mm paraesophageal short axis lymph  node seen on image 16 of series 2. No left hilar lymphadenopathy. The heart size is normal. No pericardial effusion. Coronary artery calcification is noted. Lungs / Pleura: Volume loss noted in the right upper and middle lobes. Confluent airspace opacity in the periphery of the right upper lobe may be a combination of neoplasm and postobstructive collapse. There is a small right pleural effusion. Upper Abdomen:  Unremarkable. MSK / Soft Tissues: Bone windows reveal no worrisome lytic or sclerotic osseous lesions. IMPRESSION: 1. Large central right lung/hilar mass extends into the mediastinum, circumferentially encasing the right mainstem bronchus and pulmonary arteries to all 3 lobes of the right lung. There is associated mediastinal lymphadenopathy and probably associated postobstructive collapse from the central lung lesion. Electronically Signed   By: Misty Stanley M.D.   On: 04/10/2015 20:25     Microbiology: Recent Results (from the past 240 hour(s))  Culture, blood (routine x 2)     Status: None (Preliminary result)   Collection Time: 04/10/15  7:10 PM  Result Value Ref Range Status   Specimen Description BLOOD RIGHT ANTECUBITAL  Final   Special Requests BOTTLES DRAWN AEROBIC AND ANAEROBIC 5CC  Final   Culture   Final    NO GROWTH < 24 HOURS Performed at [facility]    Report Status PENDING  Incomplete     Labs: Basic Metabolic Panel:  Recent Labs Lab 04/10/15 1910 04/12/15 0325  NA 132* 128*  K 3.8 3.9    CL 100* 97*  CO2 21* 23  GLUCOSE 130* 119*  BUN 19 18  CREATININE 0.67 0.64  CALCIUM 9.0 8.5*   Liver Function Tests:  Recent Labs Lab 04/12/15 0325  AST 15  ALT 12*  ALKPHOS 55  BILITOT 0.7  PROT 6.1*  ALBUMIN 3.2*   No results for input(s): LIPASE, AMYLASE in the last 168 hours. No results for input(s): AMMONIA in the last 168 hours. CBC:  Recent Labs Lab 04/10/15 1910 04/12/15 0325  WBC 14.6* 8.8  NEUTROABS 12.4*  --   HGB 13.4 12.4  HCT 39.2 35.2*  MCV 89.1 88.4  PLT 263 227   Cardiac Enzymes: No results for input(s): CKTOTAL, CKMB, CKMBINDEX, TROPONINI in the last 168 hours. BNP: Invalid input(s): POCBNP CBG:  Recent Labs Lab 04/11/15 1200 04/11/15 1710 04/11/15 2158 04/12/15 0746 04/12/15 1150  GLUCAP 114* 123* 130* 111* 100*    Time coordinating discharge:  Greater than 30 minutes  Signed:  Cyana Shook, DO [facility] Pager: [phone] 04/12/2015, 12:40 PM

## 2015-04-15 LAB — CULTURE, BLOOD (ROUTINE X 2): CULTURE: NO GROWTH

## 2015-04-16 ENCOUNTER — Telehealth: Payer: Self-pay | Admitting: Pulmonary Disease

## 2015-04-16 LAB — CULTURE, BLOOD (ROUTINE X 2): Culture: NO GROWTH

## 2015-04-16 NOTE — Telephone Encounter (Signed)
Thanks Marden Noble!  We'll take care of it from here.

## 2015-04-16 NOTE — Telephone Encounter (Signed)
I called Madison Fry is afternoon to let her know that she does in fact have lung cancer. She has an appointment with Dr. Grayland Ormond to [place] [facility] at 1:30. I will cc him on this note so that he is aware the pathology report is back.

## 2015-04-17 ENCOUNTER — Encounter: Payer: Self-pay | Admitting: Oncology

## 2015-04-17 ENCOUNTER — Inpatient Hospital Stay: Payer: Commercial Managed Care - HMO | Attending: Oncology | Admitting: Oncology

## 2015-04-17 VITALS — BP 135/72 | HR 96 | Temp 98.4°F | Resp 17 | Ht 65.0 in | Wt 175.4 lb

## 2015-04-17 DIAGNOSIS — C3491 Malignant neoplasm of unspecified part of right bronchus or lung: Secondary | ICD-10-CM

## 2015-04-17 DIAGNOSIS — R590 Localized enlarged lymph nodes: Secondary | ICD-10-CM | POA: Diagnosis not present

## 2015-04-17 DIAGNOSIS — J189 Pneumonia, unspecified organism: Secondary | ICD-10-CM | POA: Diagnosis not present

## 2015-04-17 DIAGNOSIS — Z87891 Personal history of nicotine dependence: Secondary | ICD-10-CM | POA: Diagnosis not present

## 2015-04-17 DIAGNOSIS — J90 Pleural effusion, not elsewhere classified: Secondary | ICD-10-CM | POA: Diagnosis not present

## 2015-04-17 DIAGNOSIS — F419 Anxiety disorder, unspecified: Secondary | ICD-10-CM | POA: Diagnosis not present

## 2015-04-17 DIAGNOSIS — E119 Type 2 diabetes mellitus without complications: Secondary | ICD-10-CM | POA: Diagnosis not present

## 2015-04-17 DIAGNOSIS — Z79899 Other long term (current) drug therapy: Secondary | ICD-10-CM | POA: Diagnosis not present

## 2015-04-17 DIAGNOSIS — Z853 Personal history of malignant neoplasm of breast: Secondary | ICD-10-CM | POA: Diagnosis not present

## 2015-04-17 DIAGNOSIS — I10 Essential (primary) hypertension: Secondary | ICD-10-CM | POA: Insufficient documentation

## 2015-04-17 DIAGNOSIS — Z7984 Long term (current) use of oral hypoglycemic drugs: Secondary | ICD-10-CM | POA: Diagnosis not present

## 2015-04-17 DIAGNOSIS — C3411 Malignant neoplasm of upper lobe, right bronchus or lung: Secondary | ICD-10-CM

## 2015-04-17 DIAGNOSIS — C7951 Secondary malignant neoplasm of bone: Secondary | ICD-10-CM | POA: Insufficient documentation

## 2015-04-17 DIAGNOSIS — M818 Other osteoporosis without current pathological fracture: Secondary | ICD-10-CM | POA: Diagnosis not present

## 2015-04-17 DIAGNOSIS — C3490 Malignant neoplasm of unspecified part of unspecified bronchus or lung: Secondary | ICD-10-CM

## 2015-04-17 NOTE — Progress Notes (Signed)
Pt reports being nervous about having breast cancer.  Pt went to the hospital for pnuemonia and still has wheezing

## 2015-04-24 ENCOUNTER — Ambulatory Visit
Admission: RE | Admit: 2015-04-24 | Discharge: 2015-04-24 | Disposition: A | Discharge status: Home / Self Care | Payer: Commercial Managed Care - HMO | Source: Ambulatory Visit | Attending: Oncology | Admitting: Oncology

## 2015-04-24 DIAGNOSIS — C3490 Malignant neoplasm of unspecified part of unspecified bronchus or lung: Secondary | ICD-10-CM

## 2015-04-24 DIAGNOSIS — M899 Disorder of bone, unspecified: Secondary | ICD-10-CM | POA: Insufficient documentation

## 2015-04-24 DIAGNOSIS — R590 Localized enlarged lymph nodes: Secondary | ICD-10-CM | POA: Insufficient documentation

## 2015-04-24 DIAGNOSIS — I2510 Atherosclerotic heart disease of native coronary artery without angina pectoris: Secondary | ICD-10-CM | POA: Insufficient documentation

## 2015-04-24 DIAGNOSIS — J9819 Other pulmonary collapse: Secondary | ICD-10-CM | POA: Diagnosis not present

## 2015-04-24 DIAGNOSIS — J90 Pleural effusion, not elsewhere classified: Secondary | ICD-10-CM | POA: Insufficient documentation

## 2015-04-24 DIAGNOSIS — C3401 Malignant neoplasm of right main bronchus: Secondary | ICD-10-CM | POA: Diagnosis not present

## 2015-04-24 LAB — GLUCOSE, CAPILLARY: GLUCOSE-CAPILLARY: 107 mg/dL — AB (ref 65–99)

## 2015-04-24 MED ORDER — FLUDEOXYGLUCOSE F - 18 (FDG) INJECTION
13.1900 | Freq: Once | INTRAVENOUS | Status: AC | PRN
  Administered 2015-04-24: 13.19 via INTRAVENOUS

## 2015-04-25 ENCOUNTER — Ambulatory Visit
Admission: RE | Admit: 2015-04-25 | Discharge: 2015-04-25 | Disposition: A | Discharge status: Home / Self Care | Payer: Commercial Managed Care - HMO | Source: Ambulatory Visit | Attending: Radiation Oncology | Admitting: Radiation Oncology

## 2015-04-25 ENCOUNTER — Encounter: Payer: Self-pay | Admitting: Radiation Oncology

## 2015-04-25 VITALS — BP 152/75 | HR 93 | Temp 98.3°F | Resp 20 | Wt 173.4 lb

## 2015-04-25 DIAGNOSIS — Z87891 Personal history of nicotine dependence: Secondary | ICD-10-CM | POA: Insufficient documentation

## 2015-04-25 DIAGNOSIS — Z7984 Long term (current) use of oral hypoglycemic drugs: Secondary | ICD-10-CM | POA: Insufficient documentation

## 2015-04-25 DIAGNOSIS — C3411 Malignant neoplasm of upper lobe, right bronchus or lung: Secondary | ICD-10-CM | POA: Insufficient documentation

## 2015-04-25 DIAGNOSIS — F17210 Nicotine dependence, cigarettes, uncomplicated: Secondary | ICD-10-CM | POA: Insufficient documentation

## 2015-04-25 DIAGNOSIS — R51 Headache: Secondary | ICD-10-CM | POA: Insufficient documentation

## 2015-04-25 DIAGNOSIS — I10 Essential (primary) hypertension: Secondary | ICD-10-CM | POA: Insufficient documentation

## 2015-04-25 DIAGNOSIS — E119 Type 2 diabetes mellitus without complications: Secondary | ICD-10-CM | POA: Insufficient documentation

## 2015-04-25 DIAGNOSIS — Z510 Encounter for antineoplastic radiation therapy: Secondary | ICD-10-CM | POA: Insufficient documentation

## 2015-04-25 DIAGNOSIS — Z853 Personal history of malignant neoplasm of breast: Secondary | ICD-10-CM | POA: Insufficient documentation

## 2015-04-25 DIAGNOSIS — C3490 Malignant neoplasm of unspecified part of unspecified bronchus or lung: Secondary | ICD-10-CM

## 2015-04-25 DIAGNOSIS — C7951 Secondary malignant neoplasm of bone: Secondary | ICD-10-CM | POA: Insufficient documentation

## 2015-04-25 NOTE — Consult Note (Signed)
Except an outstanding is perfect of Radiation Oncology NEW PATIENT EVALUATION  Name: Madison Fry  MRN: [id number]  Date:   04/25/2015     DOB: 06-19-1928   This 82 y.o. female patient presents to the clinic for initial evaluation of stage IV small cell lung cancer.  REFERRING PHYSICIAN: Mayra Neer, MD  CHIEF COMPLAINT:  Chief Complaint  Patient presents with  . Lung Cancer    Pt is here for initial consultation of lung cancer.      DIAGNOSIS: The encounter diagnosis was Malignant neoplasm of lung, unspecified laterality, unspecified part of lung ([place]).   PREVIOUS INVESTIGATIONS:  PET CT and CT scans reviewed Pathology report reviewed Clinical notes reviewed  HPI: Patient is a 82 year old female well-known to our department having received radiation therapy to her right breast for breast cancer over 5 years prior. She recently presented with increasing wheezing cough and was found on plain films have a large right upper lobe mass. CT scan showed a large right hilar lung mass with marked atelectasis of the right lung extending and invading the mediastinum and encasing the right mainstem bronchus and pulmonary arteries. Endobronchial biopsy was performed and tissue pathology positive for small cell undifferentiated carcinoma. PET CT scan was performed showing hypermetabolic right hilar mass as well as supraclavicular and mediastinal adenopathy. She also has hypermetabolic osseous metastasis making her stage IV extensive stage disease. Patient is adamantly refused systemic chemotherapy. She has extensive comorbidities including adult-onset diabetes hypertension. MRI brain has been ordered she does have some mild headaches. She is seen today for consideration of palliative radiation therapy to her right chest.  PLANNED TREATMENT REGIMEN: Palliative radiation therapy to right chest  PAST MEDICAL HISTORY:  has a past medical history of Diabetes mellitus without complication ([place]);  Hypertension; and Cancer ([place]).    PAST SURGICAL HISTORY:  Past Surgical History  Procedure Laterality Date  . Video bronchoscopy Bilateral 04/12/2015    Procedure: VIDEO BRONCHOSCOPY WITH FLUORO;  Surgeon: Juanito Doom, MD;  Location: [facility];  Service: Cardiopulmonary;  Laterality: Bilateral;    FAMILY HISTORY: family history is not on file.  SOCIAL HISTORY:  reports that she quit smoking about 24 years ago. Her smoking use included Cigarettes. She smoked 1.50 packs per day. She has never used smokeless tobacco. She reports that she does not drink alcohol.  ALLERGIES: Shellfish allergy  MEDICATIONS:  Current Outpatient Prescriptions  Medication Sig Dispense Refill  . colesevelam (WELCHOL) 625 MG tablet Take by mouth 2 (two) times daily with a meal.    . COMBIGAN 0.2-0.5 % ophthalmic solution PUT 1 DROP IN AFFECTED EYE EVERY 12 HOURS**NEED OFFICE VISIT**  0  . ezetimibe (ZETIA) 10 MG tablet Take 10 mg by mouth daily.    Marland Kitchen glimepiride (AMARYL) 2 MG tablet Take 2 mg by mouth daily with breakfast.    . HYDROcodone-acetaminophen (NORCO/VICODIN) 5-325 MG tablet TAKE 1-2 TABLETS BY MOUTH EVERY 8 HOURS AS NEEDED FOR PAIN  0  . levothyroxine (SYNTHROID, LEVOTHROID) 50 MCG tablet Take 50 mcg by mouth daily.    Marland Kitchen LORazepam (ATIVAN) 0.5 MG tablet     . losartan (COZAAR) 100 MG tablet Take 100 mg by mouth daily.    . metFORMIN (GLUCOPHAGE-XR) 500 MG 24 hr tablet TAKE 1 TABLET BY MOUTH WITH MEALS  2  . TRAVATAN Z 0.004 % SOLN ophthalmic solution PUT 1 DROP INTO BOTH EYES AT BEDTIME  0  . verapamil (CALAN-SR) 120 MG CR tablet Take 120 mg by  mouth daily.     No current facility-administered medications for this encounter.    ECOG PERFORMANCE STATUS:  2 - Symptomatic, <50% confined to bed  REVIEW OF SYSTEMS: Except for the cough or wheezing weakness Patient denies any weight loss, fatigue, weakness, fever, chills or night sweats. Patient denies any loss of vision, blurred vision.  Patient denies any ringing  of the ears or hearing loss. No irregular heartbeat. Patient denies heart murmur or history of fainting. Patient denies any chest pain or pain radiating to her upper extremities. Patient denies any shortness of breath, difficulty breathing at night, cough or hemoptysis. Patient denies any swelling in the lower legs. Patient denies any nausea vomiting, vomiting of blood, or coffee ground material in the vomitus. Patient denies any stomach pain. Patient states has had normal bowel movements no significant constipation or diarrhea. Patient denies any dysuria, hematuria or significant nocturia. Patient denies any problems walking, swelling in the joints or loss of balance. Patient denies any skin changes, loss of hair or loss of weight. Patient denies any excessive worrying or anxiety or significant depression. Patient denies any problems with insomnia. Patient denies excessive thirst, polyuria, polydipsia. Patient denies any swollen glands, patient denies easy bruising or easy bleeding. Patient denies any recent infections, allergies or URI. Patient "s visual fields have not changed significantly in recent time.    PHYSICAL EXAM: BP 152/75 mmHg  Pulse 93  Temp(Src) 98.3 F (36.8 C)  Resp 20  Wt 173 lb 6.3 oz (78.65 kg) Well-developed elderly female wheelchair-bound in NAD. No evidence of venous collateral distention or facial plethora is noted. Well-developed well-nourished patient in NAD. HEENT reveals PERLA, EOMI, discs not visualized.  Oral cavity is clear. No oral mucosal lesions are identified. Neck is clear without evidence of cervical or supraclavicular adenopathy. Lungs are clear to A&P. Cardiac examination is essentially unremarkable with regular rate and rhythm without murmur rub or thrill. Abdomen is benign with no organomegaly or masses noted. Motor sensory and DTR levels are equal and symmetric in the upper and lower extremities. Cranial nerves II through XII are  grossly intact. Proprioception is intact. No peripheral adenopathy or edema is identified. No motor or sensory levels are noted. Crude visual fields are within normal range.  LABORATORY DATA: Pathology reports reviewed    RADIOLOGY RESULTS: CT scans and PET/CT scans reviewed   IMPRESSION: Stage IV extensive stage small cell lung cancer in 82 year old female with previous history of right breast radiation 5 years prior for palliative ration therapy to her right lung.  PLAN: At this time like to go ahead with the palliative course of radiation therapy to her right chest. Would treat up to 4000 cGy over 4 weeks. Based on her prior history of radiation therapy to her right breast would plan on using IMRT treatment planning and delivery to spare previously radiated tissue. Of the right breast. Risks and benefits of treatment including fatigue possible alteration of blood counts skin reaction possible radiation esophagitis all were discussed in detail with the patient and her daughter. 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 I personally ordered CT simulation first thing next week.  I would like to take this opportunity for allowing me to participate in the care of your patient.Armstead Peaks., MD

## 2015-04-26 ENCOUNTER — Encounter: Payer: Self-pay | Admitting: Internal Medicine

## 2015-04-26 ENCOUNTER — Ambulatory Visit (INDEPENDENT_AMBULATORY_CARE_PROVIDER_SITE_OTHER): Payer: Commercial Managed Care - HMO | Admitting: Internal Medicine

## 2015-04-26 VITALS — BP 140/72 | HR 76 | Ht 64.5 in | Wt 173.0 lb

## 2015-04-26 DIAGNOSIS — R0600 Dyspnea, unspecified: Secondary | ICD-10-CM

## 2015-04-26 DIAGNOSIS — C3401 Malignant neoplasm of right main bronchus: Secondary | ICD-10-CM | POA: Diagnosis not present

## 2015-04-26 MED ORDER — ALBUTEROL SULFATE (2.5 MG/3ML) 0.083% IN NEBU: 2.5000 mg | INHALATION_SOLUTION | Freq: Three times a day (TID) | RESPIRATORY_TRACT | Status: DC

## 2015-04-26 NOTE — Progress Notes (Signed)
* [facility]     Assessment and Plan:  Small cell lung cancer, extensive stage. -With right middle lobe atelectasis due to endobronchial and external compression. -The patient had several questions about her prognosis and whether her disease could be cured. I explained that her treatment is palliative, and as such we are trying to buy her more time, and reduce her symptoms. However, the disease will likely progress, and it is very unlikely that it would be cured.  Dyspnea with Acute respiratory failure. -Secondary to above with the atelectasis of the right middle lobe due to endobronchial cancer as well as external compression from cancer. -I explained that medications cannot open up these parts of the lungs, we will await her to undergo radiation therapy, which should hopefully help with this aspect. -In the meantime, I'm prescribing her a nebulizer with albuterol to be used 3 times daily to open up other parts of the lung, to see if this may help with her breathing. I've instructed her to try this for one week and if she has no improvement, she may stop them if they do not.   Malignant pleural effusion. -Small to moderate right-sided pleural effusion which is presumed to be malignant. -As there is no area of compressive atelectasis, and the effusion appears to be relatively small, there does not appear to be any need for thoracentesis at this time. I do not believe that this is contributing significantly to her dyspnea.  Debility/deconditioning. -Patient has poor function status with dyspnea on normal activity. Continue trying to increase activity level as tolerated.  Date: 04/26/2015  MRN# [id number] Madison Fry 1928-02-18   Madison Fry is a 82 y.o. old female seen in follow up for chief complaint of  Chief Complaint  Patient presents with  . Hospitalization Follow-up    pt. states breathing is baseline since hosp. c/o dry cough, SOB, wheezing, chest  tightness.      HPI:   The patient is a 82 year old female. She has a history of breast cancer, currently in remission. She was seen in the hospital at [facility] on 04/11/2015, at that time she was noted to have been treated for pneumonia, which was not resolving. Subsequent she underwent a CT scan which showed a right lung mass. She underwent a bronchoscopy on 04/12/2015 which showed a right endobronchial lesion in the right mainstem. This was consistent with small cell lung cancer, staged as extensive disease due to the presence of supraclavicular and mediastinal adenopathy as well as osseous metastatic disease. She was seen by radiation oncology on 03/302017 for Initiation of palliative relation therapy.   Review of CT and chest x-ray imaging and reports from 04/10/2015, 04/05/2015, 03/18/15; there is a right hilar mass encasing the right mainstem bronchus extending to the pleural surface and mediastinum. There is a small to moderate right-sided pleural effusion, there does not appear to be associated compressive atelectasis.  She notes that she has been having some difficulty breathing and some wheezing. At rest she has wheezing, and is better then it used to be. Her breathing is ok when she is sitting still.  She uses a walker and a power chair to get around her house and outside she uses wakler. She uses a bathstool to bathe and requires assistance. She lives in her own apartment, one of her kids is always with her.  She has never been diagnosed with lung disease other than cancer. She used to smoke 1.5 ppd, until the 1990s.  She is starting radiation next week.   Medication:   Outpatient Encounter Prescriptions as of 04/26/2015  Medication Sig  . colesevelam (WELCHOL) 625 MG tablet Take by mouth 2 (two) times daily with a meal.  . COMBIGAN 0.2-0.5 % ophthalmic solution PUT 1 DROP IN AFFECTED EYE EVERY 12 HOURS**NEED OFFICE VISIT**  . ezetimibe (ZETIA) 10 MG tablet Take 10 mg by mouth  daily.  Marland Kitchen glimepiride (AMARYL) 2 MG tablet Take 2 mg by mouth daily with breakfast.  . HYDROcodone-acetaminophen (NORCO/VICODIN) 5-325 MG tablet TAKE 1-2 TABLETS BY MOUTH EVERY 8 HOURS AS NEEDED FOR PAIN  . levothyroxine (SYNTHROID, LEVOTHROID) 50 MCG tablet Take 50 mcg by mouth daily.  Marland Kitchen LORazepam (ATIVAN) 0.5 MG tablet   . losartan (COZAAR) 100 MG tablet Take 100 mg by mouth daily.  . metFORMIN (GLUCOPHAGE-XR) 500 MG 24 hr tablet TAKE 1 TABLET BY MOUTH WITH MEALS  . TRAVATAN Z 0.004 % SOLN ophthalmic solution PUT 1 DROP INTO BOTH EYES AT BEDTIME  . verapamil (CALAN-SR) 120 MG CR tablet Take 120 mg by mouth daily.   No facility-administered encounter medications on file as of 04/26/2015.     Allergies:  Shellfish allergy  Review of Systems: Gen:  Denies  fever, sweats. HEENT: Denies blurred vision. Cvc:  No dizziness, chest pain or heaviness Resp:  Has occasional coughing, and can hear herself wheezing. Gi: Denies swallowing difficulty, stomach pain. constipation, bowel incontinence Gu:  Denies bladder incontinence, burning urine Ext:   No Joint pain, stiffness. Skin: No skin rash, easy bruising. Endoc:  No polyuria, polydipsia. Psych: No depression, insomnia. Other:  All other systems were reviewed and found to be negative other than what is mentioned in the HPI.   Physical Examination:   VS: BP 140/72 mmHg  Pulse 76  Ht 5' 4.5" (1.638 m)  Wt 173 lb (78.472 kg)  BMI 29.25 kg/m2  SpO2 95%  General Appearance: No distress  Neuro:without focal findings,  speech normal,  HEENT: PERRLA, EOM intact. Pulmonary:  Decreased right middle lobe air entry. Polyphonic wheezing throughout the right lung. CardiovascularNormal S1,S2.  No m/r/g.   Abdomen: Benign, Soft, non-tender. Renal:  No costovertebral tenderness  GU:  Not performed at this time. Endoc: No evident thyromegaly, no signs of acromegaly. Skin:   warm, no rash. Extremities: normal, no cyanosis, clubbing.   LABORATORY  PANEL:   CBC No results for input(s): WBC, HGB, HCT, PLT in the last 168 hours. ------------------------------------------------------------------------------------------------------------------  Chemistries  No results for input(s): NA, K, CL, CO2, GLUCOSE, BUN, CREATININE, CALCIUM, MG, AST, ALT, ALKPHOS, BILITOT in the last 168 hours.  Invalid input(s): GFRCGP ------------------------------------------------------------------------------------------------------------------  Cardiac Enzymes No results for input(s): TROPONINI in the last 168 hours. ------------------------------------------------------------  RADIOLOGY:   No results found for this or any previous visit. Results for orders placed during the hospital encounter of 04/10/15  DG Chest 2 View   Narrative CLINICAL DATA:  Weakness and shortness of Breath wall being treated for pneumonia.  EXAM: CHEST  2 VIEW  COMPARISON:  04/05/2015  FINDINGS: AP and lateral views of the chest show increase in volume loss in the right hemi thorax with widening of the right paratracheal stripe and more confluent density along the lateral aspect of the right lung, likely in the upper lobe. Left lung remains clear. Cardiopericardial silhouette is at upper limits of normal for size. Degenerative changes are noted in the shoulders bilaterally. No evidence for pleural effusion.  IMPRESSION: Continued progression of airspace disease in the right  lung since 03/18/2015 with increasing right lung volume loss and abnormal right paratracheal opacity associated with apparent caught off of the right mainstem bronchus. CT chest with contrast recommended to exclude the presence of a central obstructing mass lesion.   Electronically Signed   By: Misty Stanley M.D.   On: 04/10/2015 18:20    ------------------------------------------------------------------------------------------------------------------  Thank  you for allowing [facility]  [facility] to assist in the care of your patient. Our recommendations are noted above.  Please contact us if we can be of further service.   Marda Stalker, MD.  [facility] Office Number: [phone]  Patricia Pesa, M.D.  Vilinda Boehringer, M.D.  Merton Border, M.D  04/26/2015

## 2015-04-26 NOTE — Patient Instructions (Addendum)
--  Will start nebulized albuterol three times daily.  --Continue with radiation.

## 2015-04-29 ENCOUNTER — Telehealth: Payer: Self-pay | Admitting: Internal Medicine

## 2015-04-29 ENCOUNTER — Ambulatory Visit
Admission: RE | Admit: 2015-04-29 | Discharge: 2015-04-29 | Disposition: A | Discharge status: Home / Self Care | Payer: Commercial Managed Care - HMO | Source: Ambulatory Visit | Attending: Radiation Oncology | Admitting: Radiation Oncology

## 2015-04-29 DIAGNOSIS — R0600 Dyspnea, unspecified: Secondary | ICD-10-CM

## 2015-04-29 DIAGNOSIS — Z87891 Personal history of nicotine dependence: Secondary | ICD-10-CM | POA: Diagnosis not present

## 2015-04-29 DIAGNOSIS — I10 Essential (primary) hypertension: Secondary | ICD-10-CM | POA: Diagnosis not present

## 2015-04-29 DIAGNOSIS — Z7984 Long term (current) use of oral hypoglycemic drugs: Secondary | ICD-10-CM | POA: Diagnosis not present

## 2015-04-29 DIAGNOSIS — Z510 Encounter for antineoplastic radiation therapy: Secondary | ICD-10-CM | POA: Diagnosis present

## 2015-04-29 DIAGNOSIS — Z853 Personal history of malignant neoplasm of breast: Secondary | ICD-10-CM | POA: Diagnosis not present

## 2015-04-29 DIAGNOSIS — C7951 Secondary malignant neoplasm of bone: Secondary | ICD-10-CM | POA: Diagnosis not present

## 2015-04-29 DIAGNOSIS — C3411 Malignant neoplasm of upper lobe, right bronchus or lung: Secondary | ICD-10-CM | POA: Diagnosis not present

## 2015-04-29 DIAGNOSIS — R51 Headache: Secondary | ICD-10-CM | POA: Diagnosis not present

## 2015-04-29 DIAGNOSIS — E119 Type 2 diabetes mellitus without complications: Secondary | ICD-10-CM | POA: Diagnosis not present

## 2015-04-29 DIAGNOSIS — F17210 Nicotine dependence, cigarettes, uncomplicated: Secondary | ICD-10-CM | POA: Diagnosis not present

## 2015-04-29 MED ORDER — ALBUTEROL SULFATE (2.5 MG/3ML) 0.083% IN NEBU: 2.5000 mg | INHALATION_SOLUTION | Freq: Three times a day (TID) | RESPIRATORY_TRACT | Status: AC

## 2015-04-29 NOTE — Telephone Encounter (Signed)
Pt's daughter wants neb medication to be sent to [facility] [address] She wants neb medication cancelled through [facility]. Nothing further needed.

## 2015-04-29 NOTE — Progress Notes (Signed)
[place]  [phone]) [phone] Fax:(336) [phone]  ID: Madison Fry OB: 01-25-1929  MR#: [id number]  MOQ#:[id number]  Patient Care Team: Mayra Neer, MD as PCP - General  CHIEF COMPLAINT:  Chief Complaint  Patient presents with  . New Evaluation    Breast Cancer    INTERVAL HISTORY: Patient returns to clinic today as an add-on after recently being diagnosed with small cell lung cancer by bronchoscopy. She is anxious, but otherwise feels well. She has some residual wheezing and shortness of breath from her recent diagnosis of pneumonia. She has no fevers.  She has good appetite and has maintained her weight.  She has no chest pain.  She denies any nausea, vomiting, constipation, or diarrhea.  She has no urinary complaints.  Patient offers no further specific complaints today.  REVIEW OF SYSTEMS:   Review of Systems  Constitutional: Negative.  Negative for fever and malaise/fatigue.  Respiratory: Positive for cough, shortness of breath and wheezing. Negative for hemoptysis.   Cardiovascular: Negative.  Negative for chest pain.  Gastrointestinal: Negative.   Musculoskeletal: Negative.   Neurological: Negative.  Negative for weakness.  Psychiatric/Behavioral: The patient is nervous/anxious.     As per HPI. Otherwise, a complete review of systems is negatve.  PAST MEDICAL HISTORY: Past Medical History  Diagnosis Date  . Diabetes mellitus without complication ([place])   . Hypertension   . Cancer ([place])     breast cancer, Right side lymph nodes removed    PAST SURGICAL HISTORY: Right colectomy for benign disease, right lumpectomy.  FAMILY HISTORY: Reviewed and unchanged. No reported history of malignancy or chronic disease.     ADVANCED DIRECTIVES:    HEALTH MAINTENANCE: Social History  Substance Use Topics  . Smoking status: Former Smoker -- 1.50 packs/day    Types: Cigarettes    Quit date: 08/27/1990  . Smokeless tobacco: Never Used  .  Alcohol Use: No     Colonoscopy:  PAP:  Bone density:  Lipid panel:  Allergies  Allergen Reactions  . Shellfish Allergy Swelling    Current Outpatient Prescriptions  Medication Sig Dispense Refill  . colesevelam (WELCHOL) 625 MG tablet Take by mouth 2 (two) times daily with a meal.    . COMBIGAN 0.2-0.5 % ophthalmic solution PUT 1 DROP IN AFFECTED EYE EVERY 12 HOURS**NEED OFFICE VISIT**  0  . ezetimibe (ZETIA) 10 MG tablet Take 10 mg by mouth daily.    Marland Kitchen glimepiride (AMARYL) 2 MG tablet Take 2 mg by mouth daily with breakfast.    . HYDROcodone-acetaminophen (NORCO/VICODIN) 5-325 MG tablet TAKE 1-2 TABLETS BY MOUTH EVERY 8 HOURS AS NEEDED FOR PAIN  0  . levothyroxine (SYNTHROID, LEVOTHROID) 50 MCG tablet Take 50 mcg by mouth daily.    Marland Kitchen losartan (COZAAR) 100 MG tablet Take 100 mg by mouth daily.    . metFORMIN (GLUCOPHAGE-XR) 500 MG 24 hr tablet TAKE 1 TABLET BY MOUTH WITH MEALS  2  . TRAVATAN Z 0.004 % SOLN ophthalmic solution PUT 1 DROP INTO BOTH EYES AT BEDTIME  0  . verapamil (CALAN-SR) 120 MG CR tablet Take 120 mg by mouth daily.    Marland Kitchen albuterol (PROVENTIL) (2.5 MG/3ML) 0.083% nebulizer solution Take 3 mLs (2.5 mg total) by nebulization 3 (three) times daily. 252 mL 3  . LORazepam (ATIVAN) 0.5 MG tablet      No current facility-administered medications for this visit.    OBJECTIVE: Filed Vitals:   04/17/15 1402  BP: 135/72  Pulse: 96  Temp:  98.4 F (36.9 C)  Resp: 17     Body mass index is 29.18 kg/(m^2).    ECOG FS:0 - Asymptomatic  General: Well-developed, well-nourished, no acute distress. Eyes: Pink conjunctiva, anicteric sclera. Breasts: Patient refused breast exam today. Lungs: Scattered wheezing throughout. Heart: Regular rate and rhythm. No rubs, murmurs, or gallops. Abdomen: Soft, nontender, nondistended. No organomegaly noted, normoactive bowel sounds. Musculoskeletal: No edema, cyanosis, or clubbing. Neuro: Alert, answering all questions appropriately.  Cranial nerves grossly intact. Skin: No rashes or petechiae noted. Psych: Normal affect.  LAB RESULTS:  Lab Results  Component Value Date   NA 128* 04/12/2015   K 3.9 04/12/2015   CL 97* 04/12/2015   CO2 23 04/12/2015   GLUCOSE 119* 04/12/2015   BUN 18 04/12/2015   CREATININE 0.64 04/12/2015   CALCIUM 8.5* 04/12/2015   PROT 6.1* 04/12/2015   ALBUMIN 3.2* 04/12/2015   AST 15 04/12/2015   ALT 12* 04/12/2015   ALKPHOS 55 04/12/2015   BILITOT 0.7 04/12/2015   GFRNONAA >60 04/12/2015   GFRAA >60 04/12/2015    Lab Results  Component Value Date   WBC 8.8 04/12/2015   NEUTROABS 12.4* 04/10/2015   HGB 12.4 04/12/2015   HCT 35.2* 04/12/2015   MCV 88.4 04/12/2015   PLT 227 04/12/2015     STUDIES: Dg Chest 2 View  04/10/2015  CLINICAL DATA:  Weakness and shortness of Breath wall being treated for pneumonia. EXAM: CHEST  2 VIEW COMPARISON:  04/05/2015 FINDINGS: AP and lateral views of the chest show increase in volume loss in the right hemi thorax with widening of the right paratracheal stripe and more confluent density along the lateral aspect of the right lung, likely in the upper lobe. Left lung remains clear. Cardiopericardial silhouette is at upper limits of normal for size. Degenerative changes are noted in the shoulders bilaterally. No evidence for pleural effusion. IMPRESSION: Continued progression of airspace disease in the right lung since 03/18/2015 with increasing right lung volume loss and abnormal right paratracheal opacity associated with apparent caught off of the right mainstem bronchus. CT chest with contrast recommended to exclude the presence of a central obstructing mass lesion. Electronically Signed   By: Misty Stanley M.D.   On: 04/10/2015 18:20   Dg Chest 2 View  04/05/2015  CLINICAL DATA:  Pneumonia, shortness of breath. EXAM: CHEST  2 VIEW COMPARISON:  March 18, 2015. FINDINGS: Stable cardiomediastinal silhouette. Left lung is clear. No pneumothorax or  pleural effusion is noted. Persistent right upper lobe airspace opacity is noted concerning for pneumonia. It appears to be slightly improved. Slight improvement involving right basilar opacity noted on prior exam. IMPRESSION: Slightly improved right upper lobe and basilar opacities concerning for pneumonia. Given the only slight improvement in the right lung opacities compared to prior exam, underlying obstructive mass or neoplasm cannot be excluded, and CT scan of the chest with intravenous contrast is recommended for further evaluation. These results will be called to the ordering clinician or representative by the Radiologist Assistant, and communication documented in the PACS or zVision Dashboard. Electronically Signed   By: Marijo Conception, M.D.   On: 04/05/2015 12:21   Ct Chest W Contrast  04/10/2015  CLINICAL DATA:  Shortness of Breath.  Worsening chest x-ray. EXAM: CT CHEST WITH CONTRAST TECHNIQUE: Multidetector CT imaging of the chest was performed during intravenous contrast administration. CONTRAST:  49m OMNIPAQUE IOHEXOL 300 MG/ML  SOLN COMPARISON:  Chest x-ray from earlier today. FINDINGS: Mediastinum / Lymph Nodes: There  is no axillary lymphadenopathy. 3.4 x 5.4 cm nodal conglomeration is identified in the right paratracheal space. 8.0 x 4.3 cm mass centered in the region of the right hilum circumferentially encases the right mainstem bronchus and markedly attenuates the bronchus intermedius. The right upper lobe bronchus is occluded. This abnormal soft tissue circumferentially encases the right upper lobe and right middle lobe pulmonary arteries. Enter lobar pulmonary artery is circumferentially encased and attenuated. 17 mm short axis subcarinal lymph node seen on image 26 series 2. 18 mm paraesophageal short axis lymph node seen on image 16 of series 2. No left hilar lymphadenopathy. The heart size is normal. No pericardial effusion. Coronary artery calcification is noted. Lungs / Pleura:  Volume loss noted in the right upper and middle lobes. Confluent airspace opacity in the periphery of the right upper lobe may be a combination of neoplasm and postobstructive collapse. There is a small right pleural effusion. Upper Abdomen:  Unremarkable. MSK / Soft Tissues: Bone windows reveal no worrisome lytic or sclerotic osseous lesions. IMPRESSION: 1. Large central right lung/hilar mass extends into the mediastinum, circumferentially encasing the right mainstem bronchus and pulmonary arteries to all 3 lobes of the right lung. There is associated mediastinal lymphadenopathy and probably associated postobstructive collapse from the central lung lesion. Electronically Signed   By: Misty Stanley M.D.   On: 04/10/2015 20:25   Nm Pet Image Initial (pi) Skull Base To Thigh  04/24/2015  CLINICAL DATA:  Initial treatment strategy for small cell lung cancer. EXAM: NUCLEAR MEDICINE PET SKULL BASE TO THIGH TECHNIQUE: 13 point to mCi F-18 FDG was injected intravenously. Full-ring PET imaging was performed from the skull base to thigh after the radiotracer. CT data was obtained and used for attenuation correction and anatomic localization. FASTING BLOOD GLUCOSE:  Value: 107 mg/dl COMPARISON:  CT chest 04/10/2015 and CT abdomen pelvis 06/28/2014. FINDINGS: NECK No hypermetabolic lymph nodes in the neck. CT images show no acute findings. CHEST Hypermetabolic lower right internal jugular/ right supraclavicular lymph nodes measure up to 1.8 cm (CT image 45) with an SUV max of 11.8. Hypermetabolic mediastinal adenopathy measures up to 3.1 x 3.4 cm in the right paratracheal station (image 69) with an SUV max of 15.1. Right hilar mass is difficult to definitively measure given lack of IV contrast and surrounding volume loss (CT image 84), with an SUV max of 13.9. There is patchy uptake within collapse/ consolidation in the right upper lobe. Obstructed right upper lobe bronchus and narrowing of the distal bronchus intermedius.  Moderate right pleural effusion. Three-vessel coronary artery calcification. No pericardial effusion. ABDOMEN/PELVIS No abnormal hypermetabolism in the liver, adrenal glands, spleen or pancreas. No hypermetabolic lymph nodes. There is focal hypermetabolism associated with bowel loops in the right anatomic pelvis (CT image 209), nonspecific. There is fat necrosis in this area. CT images show the liver, gallbladder, adrenal glands and right kidney to be unremarkable. 3.4 cm low-attenuation lesion in the left kidney is likely a cyst although definitive characterization is limited without post-contrast imaging. Spleen, pancreas and stomach are unremarkable. Postoperative changes are seen in the bowel with fat necrosis in the low omentum. No free fluid. SKELETON Foci of abnormal hypermetabolism are seen in the left humerus, spine and pelvis. Index left humerus lesion has an SUV max 4.3, corresponding to a faint sclerotic lesion (CT image 30). IMPRESSION: 1. Stage IV lung cancer with hypermetabolic right hilar mass and lower right internal jugular/right supraclavicular/mediastinal adenopathy as well as hypermetabolic osseous lesions. 2. Mildly hypermetabolic  collapse/consolidation in the right upper lobe with a moderate right pleural effusion. 3. Three-vessel coronary artery calcification. Electronically Signed   By: Lorin Picket M.D.   On: 04/24/2015 11:55    ASSESSMENT: Stage Ia adenocarcinoma of the right breast, now with stage IV small cell lung cancer with multiple bony lesions.   PLAN:    1. Small cell lung cancer: CT and PET scan results reviewed independently and reported as above with widespread bony metastasis. Patient will get an MRI the brain to complete the staging workup. Return to clinic in 1-2 weeks after her staging workup is complete for further evaluation and treatment planning. Patient will also have consultation with radiation oncology in the near future. 2. Breast cancer: Patient completed  5 years of tamoxifen, no further workup or intervention is needed at this time. 3.  Osteoporosis: Continue calcium and vitamin D supplementation. Patient will also be receiving Zometa for her bony metastasis.  Approximately 30 minutes was spent in discussion of which greater than 50% was consultation.   Patient expressed understanding and was in agreement with this plan. She also understands that She can call clinic at any time with any questions, concerns, or complaints.   Lloyd Huger, MD   04/29/2015 4:49 PM

## 2015-04-29 NOTE — Telephone Encounter (Signed)
Pt daughter calling stating we did not send in medication for patient nebulizer Would like [place] to call it into [facility] on [address]

## 2015-05-02 ENCOUNTER — Inpatient Hospital Stay: Payer: Commercial Managed Care - HMO | Attending: Oncology | Admitting: Oncology

## 2015-05-02 ENCOUNTER — Inpatient Hospital Stay: Payer: Commercial Managed Care - HMO

## 2015-05-02 VITALS — BP 157/75 | HR 93 | Temp 99.3°F | Resp 16 | Wt 167.3 lb

## 2015-05-02 DIAGNOSIS — C7951 Secondary malignant neoplasm of bone: Secondary | ICD-10-CM | POA: Diagnosis not present

## 2015-05-02 DIAGNOSIS — Z7984 Long term (current) use of oral hypoglycemic drugs: Secondary | ICD-10-CM | POA: Insufficient documentation

## 2015-05-02 DIAGNOSIS — M818 Other osteoporosis without current pathological fracture: Secondary | ICD-10-CM | POA: Diagnosis not present

## 2015-05-02 DIAGNOSIS — E119 Type 2 diabetes mellitus without complications: Secondary | ICD-10-CM | POA: Insufficient documentation

## 2015-05-02 DIAGNOSIS — C50911 Malignant neoplasm of unspecified site of right female breast: Secondary | ICD-10-CM | POA: Insufficient documentation

## 2015-05-02 DIAGNOSIS — Z87891 Personal history of nicotine dependence: Secondary | ICD-10-CM | POA: Insufficient documentation

## 2015-05-02 DIAGNOSIS — C7800 Secondary malignant neoplasm of unspecified lung: Secondary | ICD-10-CM | POA: Insufficient documentation

## 2015-05-02 DIAGNOSIS — I2510 Atherosclerotic heart disease of native coronary artery without angina pectoris: Secondary | ICD-10-CM | POA: Insufficient documentation

## 2015-05-02 DIAGNOSIS — Z170 Estrogen receptor positive status [ER+]: Secondary | ICD-10-CM | POA: Diagnosis not present

## 2015-05-02 DIAGNOSIS — Z79899 Other long term (current) drug therapy: Secondary | ICD-10-CM | POA: Diagnosis not present

## 2015-05-02 DIAGNOSIS — C3491 Malignant neoplasm of unspecified part of right bronchus or lung: Secondary | ICD-10-CM

## 2015-05-02 DIAGNOSIS — E871 Hypo-osmolality and hyponatremia: Secondary | ICD-10-CM | POA: Diagnosis not present

## 2015-05-02 DIAGNOSIS — F419 Anxiety disorder, unspecified: Secondary | ICD-10-CM | POA: Insufficient documentation

## 2015-05-02 DIAGNOSIS — I10 Essential (primary) hypertension: Secondary | ICD-10-CM | POA: Insufficient documentation

## 2015-05-02 LAB — CBC WITH DIFFERENTIAL/PLATELET
Basophils Absolute: 0.1 10*3/uL (ref 0–0.1)
Basophils Relative: 1 %
Eosinophils Absolute: 0.1 10*3/uL (ref 0–0.7)
Eosinophils Relative: 1 %
HCT: 40.4 % (ref 35.0–47.0)
Hemoglobin: 13.7 g/dL (ref 12.0–16.0)
Lymphocytes Relative: 8 %
Lymphs Abs: 1 10*3/uL (ref 1.0–3.6)
MCH: 30.4 pg (ref 26.0–34.0)
MCHC: 33.9 g/dL (ref 32.0–36.0)
MCV: 89.7 fL (ref 80.0–100.0)
Monocytes Absolute: 0.7 10*3/uL (ref 0.2–0.9)
Monocytes Relative: 5 %
Neutro Abs: 10.6 10*3/uL — ABNORMAL HIGH (ref 1.4–6.5)
Neutrophils Relative %: 85 %
Platelets: 316 10*3/uL (ref 150–440)
RBC: 4.51 MIL/uL (ref 3.80–5.20)
RDW: 14.5 % (ref 11.5–14.5)
WBC: 12.4 10*3/uL — ABNORMAL HIGH (ref 3.6–11.0)

## 2015-05-02 LAB — COMPREHENSIVE METABOLIC PANEL
ALBUMIN: 3.5 g/dL (ref 3.5–5.0)
ALT: 9 U/L — ABNORMAL LOW (ref 14–54)
ANION GAP: 7 (ref 5–15)
AST: 19 U/L (ref 15–41)
Alkaline Phosphatase: 71 U/L (ref 38–126)
BUN: 17 mg/dL (ref 6–20)
CALCIUM: 8.8 mg/dL — AB (ref 8.9–10.3)
CHLORIDE: 98 mmol/L — AB (ref 101–111)
CO2: 24 mmol/L (ref 22–32)
Creatinine, Ser: 0.56 mg/dL (ref 0.44–1.00)
GFR calc non Af Amer: >60 mL/min (ref >60)
GLUCOSE: 102 mg/dL — AB (ref 65–99)
POTASSIUM: 3.7 mmol/L (ref 3.5–5.1)
SODIUM: 129 mmol/L — AB (ref 135–145)
Total Bilirubin: 0.4 mg/dL (ref 0.3–1.2)
Total Protein: 7 g/dL (ref 6.5–8.1)

## 2015-05-02 NOTE — Progress Notes (Signed)
Here to discuss test results and only problem today is SOBr on exertion.  Patient mentions that her PCP checks her sodium level every 2 weeks and the last time it was checked was 2 weeks ago so she is due for another check.  The PCP is in [place] @ [facility] so patient would like to know if she can have her sodium checked here.  They thought the PCP was going to call and speak to Dr. Grayland Ormond regarding this matter.

## 2015-05-03 ENCOUNTER — Other Ambulatory Visit: Payer: Self-pay | Admitting: *Deleted

## 2015-05-03 DIAGNOSIS — C3491 Malignant neoplasm of unspecified part of right bronchus or lung: Secondary | ICD-10-CM

## 2015-05-03 DIAGNOSIS — Z510 Encounter for antineoplastic radiation therapy: Secondary | ICD-10-CM | POA: Diagnosis not present

## 2015-05-07 ENCOUNTER — Other Ambulatory Visit: Payer: Self-pay | Admitting: *Deleted

## 2015-05-07 DIAGNOSIS — Z510 Encounter for antineoplastic radiation therapy: Secondary | ICD-10-CM | POA: Diagnosis not present

## 2015-05-08 ENCOUNTER — Ambulatory Visit
Admission: RE | Admit: 2015-05-08 | Discharge: 2015-05-08 | Disposition: A | Discharge status: Home / Self Care | Payer: Commercial Managed Care - HMO | Source: Ambulatory Visit | Attending: Oncology | Admitting: Oncology

## 2015-05-08 ENCOUNTER — Ambulatory Visit: Payer: Commercial Managed Care - HMO

## 2015-05-08 ENCOUNTER — Ambulatory Visit
Admission: RE | Admit: 2015-05-08 | Discharge: 2015-05-08 | Disposition: A | Discharge status: Home / Self Care | Payer: Commercial Managed Care - HMO | Source: Ambulatory Visit | Attending: Radiation Oncology | Admitting: Radiation Oncology

## 2015-05-08 DIAGNOSIS — C3490 Malignant neoplasm of unspecified part of unspecified bronchus or lung: Secondary | ICD-10-CM | POA: Diagnosis present

## 2015-05-08 MED ORDER — GADOBENATE DIMEGLUMINE 529 MG/ML IV SOLN
15.0000 mL | Freq: Once | INTRAVENOUS | Status: AC | PRN
  Administered 2015-05-08: 15 mL via INTRAVENOUS

## 2015-05-09 ENCOUNTER — Ambulatory Visit: Payer: Commercial Managed Care - HMO

## 2015-05-09 ENCOUNTER — Ambulatory Visit
Admission: RE | Admit: 2015-05-09 | Discharge: 2015-05-09 | Disposition: A | Discharge status: Home / Self Care | Payer: Commercial Managed Care - HMO | Source: Ambulatory Visit | Attending: Radiation Oncology | Admitting: Radiation Oncology

## 2015-05-10 ENCOUNTER — Ambulatory Visit: Payer: Commercial Managed Care - HMO

## 2015-05-10 NOTE — Progress Notes (Signed)
[place]  [phone] Fax:(336) [phone]  ID: JAYLAH GOODLOW OB: 1928-06-12  MR#: [id number]  QIO#:[id number]  Patient Care Team: Mayra Neer, MD as PCP - General  CHIEF COMPLAINT:  Chief Complaint  Patient presents with  . Lung Cancer    INTERVAL HISTORY: Patient returns to clinic today for further evaluation and consideration of additional treatment planning. She continues to be anxious, but otherwise feels well. Her shortness of breath and occasional wheezing or unchanged. She has no fevers.  She has good appetite and has maintained her weight.  She has no chest pain.  She denies any nausea, vomiting, constipation, or diarrhea.  She has no urinary complaints.  Patient offers no further specific complaints today.  REVIEW OF SYSTEMS:   Review of Systems  Constitutional: Negative.  Negative for fever and malaise/fatigue.  Respiratory: Positive for cough, shortness of breath and wheezing. Negative for hemoptysis.   Cardiovascular: Negative.  Negative for chest pain.  Gastrointestinal: Negative.   Musculoskeletal: Negative.   Neurological: Negative.  Negative for weakness.  Psychiatric/Behavioral: The patient is nervous/anxious.     As per HPI. Otherwise, a complete review of systems is negatve.  PAST MEDICAL HISTORY: Past Medical History  Diagnosis Date  . Diabetes mellitus without complication ([place])   . Hypertension   . Cancer ([place])     breast cancer, Right side lymph nodes removed    PAST SURGICAL HISTORY: Right colectomy for benign disease, right lumpectomy.  FAMILY HISTORY: Reviewed and unchanged. No reported history of malignancy or chronic disease.     ADVANCED DIRECTIVES:    HEALTH MAINTENANCE: Social History  Substance Use Topics  . Smoking status: Former Smoker -- 1.50 packs/day    Types: Cigarettes    Quit date: 08/27/1990  . Smokeless tobacco: Never Used  . Alcohol Use: No     Colonoscopy:  PAP:  Bone  density:  Lipid panel:  Allergies  Allergen Reactions  . Shellfish Allergy Swelling    Current Outpatient Prescriptions  Medication Sig Dispense Refill  . albuterol (PROVENTIL) (2.5 MG/3ML) 0.083% nebulizer solution Take 3 mLs (2.5 mg total) by nebulization 3 (three) times daily. 252 mL 3  . colesevelam (WELCHOL) 625 MG tablet Take by mouth 2 (two) times daily with a meal.    . COMBIGAN 0.2-0.5 % ophthalmic solution PUT 1 DROP IN AFFECTED EYE EVERY 12 HOURS**NEED OFFICE VISIT**  0  . ezetimibe (ZETIA) 10 MG tablet Take 10 mg by mouth daily.    Marland Kitchen glimepiride (AMARYL) 2 MG tablet Take 2 mg by mouth daily with breakfast.    . HYDROcodone-acetaminophen (NORCO/VICODIN) 5-325 MG tablet TAKE 1-2 TABLETS BY MOUTH EVERY 8 HOURS AS NEEDED FOR PAIN  0  . levothyroxine (SYNTHROID, LEVOTHROID) 50 MCG tablet Take 50 mcg by mouth daily.    Marland Kitchen LORazepam (ATIVAN) 0.5 MG tablet     . losartan (COZAAR) 100 MG tablet Take 100 mg by mouth daily.    . metFORMIN (GLUCOPHAGE-XR) 500 MG 24 hr tablet TAKE 1 TABLET BY MOUTH WITH MEALS  2  . TRAVATAN Z 0.004 % SOLN ophthalmic solution PUT 1 DROP INTO BOTH EYES AT BEDTIME  0  . verapamil (CALAN-SR) 120 MG CR tablet Take 120 mg by mouth daily.     No current facility-administered medications for this visit.    OBJECTIVE: Filed Vitals:   05/02/15 1419  BP: 157/75  Pulse: 93  Temp: 99.3 F (37.4 C)  Resp: 16     Body mass index  is 28.29 kg/(m^2).    ECOG FS:0 - Asymptomatic  General: Well-developed, well-nourished, no acute distress. Eyes: Pink conjunctiva, anicteric sclera. Breasts: Patient refused breast exam today. Lungs: Scattered wheezing throughout. Heart: Regular rate and rhythm. No rubs, murmurs, or gallops. Abdomen: Soft, nontender, nondistended. No organomegaly noted, normoactive bowel sounds. Musculoskeletal: No edema, cyanosis, or clubbing. Neuro: Alert, answering all questions appropriately. Cranial nerves grossly intact. Skin: No rashes or  petechiae noted. Psych: Normal affect.  LAB RESULTS:  Lab Results  Component Value Date   NA 129* 05/02/2015   K 3.7 05/02/2015   CL 98* 05/02/2015   CO2 24 05/02/2015   GLUCOSE 102* 05/02/2015   BUN 17 05/02/2015   CREATININE 0.56 05/02/2015   CALCIUM 8.8* 05/02/2015   PROT 7.0 05/02/2015   ALBUMIN 3.5 05/02/2015   AST 19 05/02/2015   ALT 9* 05/02/2015   ALKPHOS 71 05/02/2015   BILITOT 0.4 05/02/2015   GFRNONAA >60 05/02/2015   GFRAA >60 05/02/2015    Lab Results  Component Value Date   WBC 12.4* 05/02/2015   NEUTROABS 10.6* 05/02/2015   HGB 13.7 05/02/2015   HCT 40.4 05/02/2015   MCV 89.7 05/02/2015   PLT 316 05/02/2015     STUDIES: Dg Chest 2 View  04/10/2015  CLINICAL DATA:  Weakness and shortness of Breath wall being treated for pneumonia. EXAM: CHEST  2 VIEW COMPARISON:  04/05/2015 FINDINGS: AP and lateral views of the chest show increase in volume loss in the right hemi thorax with widening of the right paratracheal stripe and more confluent density along the lateral aspect of the right lung, likely in the upper lobe. Left lung remains clear. Cardiopericardial silhouette is at upper limits of normal for size. Degenerative changes are noted in the shoulders bilaterally. No evidence for pleural effusion. IMPRESSION: Continued progression of airspace disease in the right lung since 03/18/2015 with increasing right lung volume loss and abnormal right paratracheal opacity associated with apparent caught off of the right mainstem bronchus. CT chest with contrast recommended to exclude the presence of a central obstructing mass lesion. Electronically Signed   By: Misty Stanley M.D.   On: 04/10/2015 18:20   Ct Chest W Contrast  04/10/2015  CLINICAL DATA:  Shortness of Breath.  Worsening chest x-ray. EXAM: CT CHEST WITH CONTRAST TECHNIQUE: Multidetector CT imaging of the chest was performed during intravenous contrast administration. CONTRAST:  74m OMNIPAQUE IOHEXOL 300 MG/ML   SOLN COMPARISON:  Chest x-ray from earlier today. FINDINGS: Mediastinum / Lymph Nodes: There is no axillary lymphadenopathy. 3.4 x 5.4 cm nodal conglomeration is identified in the right paratracheal space. 8.0 x 4.3 cm mass centered in the region of the right hilum circumferentially encases the right mainstem bronchus and markedly attenuates the bronchus intermedius. The right upper lobe bronchus is occluded. This abnormal soft tissue circumferentially encases the right upper lobe and right middle lobe pulmonary arteries. Enter lobar pulmonary artery is circumferentially encased and attenuated. 17 mm short axis subcarinal lymph node seen on image 26 series 2. 18 mm paraesophageal short axis lymph node seen on image 16 of series 2. No left hilar lymphadenopathy. The heart size is normal. No pericardial effusion. Coronary artery calcification is noted. Lungs / Pleura: Volume loss noted in the right upper and middle lobes. Confluent airspace opacity in the periphery of the right upper lobe may be a combination of neoplasm and postobstructive collapse. There is a small right pleural effusion. Upper Abdomen:  Unremarkable. MSK / Soft Tissues: Bone windows reveal  no worrisome lytic or sclerotic osseous lesions. IMPRESSION: 1. Large central right lung/hilar mass extends into the mediastinum, circumferentially encasing the right mainstem bronchus and pulmonary arteries to all 3 lobes of the right lung. There is associated mediastinal lymphadenopathy and probably associated postobstructive collapse from the central lung lesion. Electronically Signed   By: Misty Stanley M.D.   On: 04/10/2015 20:25   Mr Jeri Cos RK Contrast  05/08/2015  CLINICAL DATA:  Lung cancer.  Staging. EXAM: MRI HEAD WITHOUT AND WITH CONTRAST TECHNIQUE: Multiplanar, multiecho pulse sequences of the brain and surrounding structures were obtained without and with intravenous contrast. CONTRAST:  51m MULTIHANCE GADOBENATE DIMEGLUMINE 529 MG/ML IV SOLN  COMPARISON:  None. FINDINGS: Moderate atrophy.  Negative for hydrocephalus Negative for acute infarct. Multiple nonenhancing white matter hyperintensities bilaterally consistent with chronic microvascular ischemia. Mild chronic ischemia in the pons. Negative for intracranial hemorrhage. Negative for mass or edema Postcontrast imaging reveals no evidence of metastatic disease. No enhancing mass lesion. Leptomeningeal enhancement normal. Bone marrow lesion C3 vertebral body on the right consistent with metastatic disease. No skull lesion identified. Skullbase intact. Small subdural fluid collection posterior to the C2 vertebral body on the right which does not enhance and is of uncertain etiology. Cervical MRI suggested for further evaluation as this could be related to the C3 metastatic disease. Normal orbit.  Paranasal sinuses clear.  Pituitary normal in size. IMPRESSION: Atrophy and chronic microvascular ischemia.  No acute infarct Negative for metastatic disease to the brain Metastatic deposit to the C3 vertebral body. Subdural effusion on the right behind the C2 vertebral body. This does not enhance. MRI of the cervical spine without with contrast suggested further evaluation. Electronically Signed   By: CFranchot GalloM.D.   On: 05/08/2015 13:24   Nm Pet Image Initial (pi) Skull Base To Thigh  04/24/2015  CLINICAL DATA:  Initial treatment strategy for small cell lung cancer. EXAM: NUCLEAR MEDICINE PET SKULL BASE TO THIGH TECHNIQUE: 13 point to mCi F-18 FDG was injected intravenously. Full-ring PET imaging was performed from the skull base to thigh after the radiotracer. CT data was obtained and used for attenuation correction and anatomic localization. FASTING BLOOD GLUCOSE:  Value: 107 mg/dl COMPARISON:  CT chest 04/10/2015 and CT abdomen pelvis 06/28/2014. FINDINGS: NECK No hypermetabolic lymph nodes in the neck. CT images show no acute findings. CHEST Hypermetabolic lower right internal jugular/ right  supraclavicular lymph nodes measure up to 1.8 cm (CT image 45) with an SUV max of 11.8. Hypermetabolic mediastinal adenopathy measures up to 3.1 x 3.4 cm in the right paratracheal station (image 69) with an SUV max of 15.1. Right hilar mass is difficult to definitively measure given lack of IV contrast and surrounding volume loss (CT image 84), with an SUV max of 13.9. There is patchy uptake within collapse/ consolidation in the right upper lobe. Obstructed right upper lobe bronchus and narrowing of the distal bronchus intermedius. Moderate right pleural effusion. Three-vessel coronary artery calcification. No pericardial effusion. ABDOMEN/PELVIS No abnormal hypermetabolism in the liver, adrenal glands, spleen or pancreas. No hypermetabolic lymph nodes. There is focal hypermetabolism associated with bowel loops in the right anatomic pelvis (CT image 209), nonspecific. There is fat necrosis in this area. CT images show the liver, gallbladder, adrenal glands and right kidney to be unremarkable. 3.4 cm low-attenuation lesion in the left kidney is likely a cyst although definitive characterization is limited without post-contrast imaging. Spleen, pancreas and stomach are unremarkable. Postoperative changes are seen in the bowel  with fat necrosis in the low omentum. No free fluid. SKELETON Foci of abnormal hypermetabolism are seen in the left humerus, spine and pelvis. Index left humerus lesion has an SUV max 4.3, corresponding to a faint sclerotic lesion (CT image 30). IMPRESSION: 1. Stage IV lung cancer with hypermetabolic right hilar mass and lower right internal jugular/right supraclavicular/mediastinal adenopathy as well as hypermetabolic osseous lesions. 2. Mildly hypermetabolic collapse/consolidation in the right upper lobe with a moderate right pleural effusion. 3. Three-vessel coronary artery calcification. Electronically Signed   By: Lorin Picket M.D.   On: 04/24/2015 11:55    ASSESSMENT: Stage Ia  adenocarcinoma of the right breast, now with stage IV small cell lung cancer with multiple bony lesions.   PLAN:    1. Small cell lung cancer: CT and PET scan results reviewed independently and reported as above with widespread bony metastasis. MRI the brain did not reveal any intracranial abnormality. Patient will initiate palliative XRT for symptomatic control of her lung in the next week. After lengthy discussion with the patient, she is unsure if she wants to pursue concurrent chemotherapy given her advanced age and decreased performance status. She is not ready to enroll in hospice at this time either. Return to clinic in approximately 3 weeks for further evaluation and additional discussion of treatment.  2. Breast cancer: Patient completed 5 years of tamoxifen, no further workup or intervention is needed at this time. 3.  Osteoporosis: Continue calcium and vitamin D supplementation. Patient will also be receiving Zometa for her bony metastasis if she chooses. 4. Hyponatremia: Likely secondary to underlying malignancy, monitor.  Approximately 30 minutes was spent in discussion of which greater than 50% was consultation.   Patient expressed understanding and was in agreement with this plan. She also understands that She can call clinic at any time with any questions, concerns, or complaints.   Lloyd Huger, MD   05/10/2015 7:48 AM

## 2015-05-13 ENCOUNTER — Ambulatory Visit
Admission: RE | Admit: 2015-05-13 | Discharge: 2015-05-13 | Disposition: A | Discharge status: Home / Self Care | Payer: Commercial Managed Care - HMO | Source: Ambulatory Visit | Attending: Radiation Oncology | Admitting: Radiation Oncology

## 2015-05-13 DIAGNOSIS — Z510 Encounter for antineoplastic radiation therapy: Secondary | ICD-10-CM | POA: Diagnosis not present

## 2015-05-14 ENCOUNTER — Ambulatory Visit
Admission: RE | Admit: 2015-05-14 | Discharge: 2015-05-14 | Disposition: A | Discharge status: Home / Self Care | Payer: Commercial Managed Care - HMO | Source: Ambulatory Visit | Attending: Radiation Oncology | Admitting: Radiation Oncology

## 2015-05-14 DIAGNOSIS — Z510 Encounter for antineoplastic radiation therapy: Secondary | ICD-10-CM | POA: Diagnosis not present

## 2015-05-15 ENCOUNTER — Ambulatory Visit
Admission: RE | Admit: 2015-05-15 | Discharge: 2015-05-15 | Disposition: A | Discharge status: Home / Self Care | Payer: Commercial Managed Care - HMO | Source: Ambulatory Visit | Attending: Radiation Oncology | Admitting: Radiation Oncology

## 2015-05-15 DIAGNOSIS — Z510 Encounter for antineoplastic radiation therapy: Secondary | ICD-10-CM | POA: Diagnosis not present

## 2015-05-16 ENCOUNTER — Ambulatory Visit
Admission: RE | Admit: 2015-05-16 | Discharge: 2015-05-16 | Disposition: A | Discharge status: Home / Self Care | Payer: Commercial Managed Care - HMO | Source: Ambulatory Visit | Attending: Radiation Oncology | Admitting: Radiation Oncology

## 2015-05-16 DIAGNOSIS — Z510 Encounter for antineoplastic radiation therapy: Secondary | ICD-10-CM | POA: Diagnosis not present

## 2015-05-17 ENCOUNTER — Ambulatory Visit
Admission: RE | Admit: 2015-05-17 | Discharge: 2015-05-17 | Disposition: A | Discharge status: Home / Self Care | Payer: Commercial Managed Care - HMO | Source: Ambulatory Visit | Attending: Radiation Oncology | Admitting: Radiation Oncology

## 2015-05-17 DIAGNOSIS — Z510 Encounter for antineoplastic radiation therapy: Secondary | ICD-10-CM | POA: Diagnosis not present

## 2015-05-20 ENCOUNTER — Ambulatory Visit
Admission: RE | Admit: 2015-05-20 | Discharge: 2015-05-20 | Disposition: A | Discharge status: Home / Self Care | Payer: Commercial Managed Care - HMO | Source: Ambulatory Visit | Attending: Radiation Oncology | Admitting: Radiation Oncology

## 2015-05-20 ENCOUNTER — Inpatient Hospital Stay: Payer: Commercial Managed Care - HMO

## 2015-05-20 DIAGNOSIS — Z510 Encounter for antineoplastic radiation therapy: Secondary | ICD-10-CM | POA: Diagnosis not present

## 2015-05-21 ENCOUNTER — Other Ambulatory Visit: Payer: Self-pay

## 2015-05-21 ENCOUNTER — Ambulatory Visit
Admission: RE | Admit: 2015-05-21 | Discharge: 2015-05-21 | Disposition: A | Discharge status: Home / Self Care | Payer: Commercial Managed Care - HMO | Source: Ambulatory Visit | Attending: Radiation Oncology | Admitting: Radiation Oncology

## 2015-05-21 DIAGNOSIS — Z510 Encounter for antineoplastic radiation therapy: Secondary | ICD-10-CM | POA: Diagnosis not present

## 2015-05-21 NOTE — Patient Outreach (Signed)
[facility]) Care Management  05/21/2015  Madison Fry 10-27-1928 [id number]   Telephone Screen  Referral Date: 05/20/15 Referral Source: [employer] Referral Reason: "disease and symptom management, new dx of lung mass"   Outreach attempt #1 to patient. Dtr answered the phone and reported patient was at appt at cancer center. Advised that RN CM would try back at another time to speak with patient.   Plan: RN CM will make outreach attempt to patient within a week.  Enzo Montgomery, RN,BSN,CCM [facility] Management Telephonic Care Management Coordinator Direct Phone: [phone] Toll Free: [phone] Fax: [phone]

## 2015-05-22 ENCOUNTER — Ambulatory Visit
Admission: RE | Admit: 2015-05-22 | Discharge: 2015-05-22 | Disposition: A | Discharge status: Home / Self Care | Payer: Commercial Managed Care - HMO | Source: Ambulatory Visit | Attending: Radiation Oncology | Admitting: Radiation Oncology

## 2015-05-22 DIAGNOSIS — Z510 Encounter for antineoplastic radiation therapy: Secondary | ICD-10-CM | POA: Diagnosis not present

## 2015-05-23 ENCOUNTER — Inpatient Hospital Stay (HOSPITAL_BASED_OUTPATIENT_CLINIC_OR_DEPARTMENT_OTHER): Payer: Commercial Managed Care - HMO | Admitting: Oncology

## 2015-05-23 ENCOUNTER — Ambulatory Visit
Admission: RE | Admit: 2015-05-23 | Discharge: 2015-05-23 | Disposition: A | Discharge status: Home / Self Care | Payer: Commercial Managed Care - HMO | Source: Ambulatory Visit | Attending: Radiation Oncology | Admitting: Radiation Oncology

## 2015-05-23 ENCOUNTER — Ambulatory Visit: Payer: Self-pay

## 2015-05-23 ENCOUNTER — Ambulatory Visit: Payer: Commercial Managed Care - HMO | Admitting: Oncology

## 2015-05-23 VITALS — BP 133/74 | HR 78 | Temp 99.0°F | Resp 16

## 2015-05-23 DIAGNOSIS — F419 Anxiety disorder, unspecified: Secondary | ICD-10-CM

## 2015-05-23 DIAGNOSIS — Z170 Estrogen receptor positive status [ER+]: Secondary | ICD-10-CM | POA: Diagnosis not present

## 2015-05-23 DIAGNOSIS — C50911 Malignant neoplasm of unspecified site of right female breast: Secondary | ICD-10-CM | POA: Diagnosis not present

## 2015-05-23 DIAGNOSIS — C7951 Secondary malignant neoplasm of bone: Secondary | ICD-10-CM | POA: Diagnosis not present

## 2015-05-23 DIAGNOSIS — I10 Essential (primary) hypertension: Secondary | ICD-10-CM

## 2015-05-23 DIAGNOSIS — C7800 Secondary malignant neoplasm of unspecified lung: Secondary | ICD-10-CM

## 2015-05-23 DIAGNOSIS — C3491 Malignant neoplasm of unspecified part of right bronchus or lung: Secondary | ICD-10-CM

## 2015-05-23 DIAGNOSIS — Z87891 Personal history of nicotine dependence: Secondary | ICD-10-CM

## 2015-05-23 DIAGNOSIS — M818 Other osteoporosis without current pathological fracture: Secondary | ICD-10-CM

## 2015-05-23 DIAGNOSIS — Z79899 Other long term (current) drug therapy: Secondary | ICD-10-CM

## 2015-05-23 DIAGNOSIS — E871 Hypo-osmolality and hyponatremia: Secondary | ICD-10-CM

## 2015-05-23 DIAGNOSIS — Z510 Encounter for antineoplastic radiation therapy: Secondary | ICD-10-CM | POA: Diagnosis not present

## 2015-05-23 DIAGNOSIS — I2510 Atherosclerotic heart disease of native coronary artery without angina pectoris: Secondary | ICD-10-CM

## 2015-05-23 DIAGNOSIS — E119 Type 2 diabetes mellitus without complications: Secondary | ICD-10-CM

## 2015-05-23 DIAGNOSIS — Z7984 Long term (current) use of oral hypoglycemic drugs: Secondary | ICD-10-CM

## 2015-05-23 NOTE — Progress Notes (Signed)
Patient has good and bad days on her bad days she is more fatigued.  Has now noticed swelling on lymph node swelling on right side of neck.  Is having a cough that is worse at night and in the morning.  Also has dyspnea on exertion.

## 2015-05-24 ENCOUNTER — Ambulatory Visit
Admission: RE | Admit: 2015-05-24 | Discharge: 2015-05-24 | Disposition: A | Discharge status: Home / Self Care | Payer: Commercial Managed Care - HMO | Source: Ambulatory Visit | Attending: Radiation Oncology | Admitting: Radiation Oncology

## 2015-05-24 ENCOUNTER — Ambulatory Visit: Payer: Commercial Managed Care - HMO

## 2015-05-24 ENCOUNTER — Other Ambulatory Visit: Payer: Self-pay

## 2015-05-24 NOTE — Patient Outreach (Signed)
[facility]) Care Management  05/24/2015  Madison Fry 02/28/1928 [id number]   Telephone Screen  Referral Date: 05/20/15 Referral Source: [employer] Referral Reason: "disease and symptom management, new dx of lung mass"   Outreach attempt #2 to patient. No answer at present-unable to leave message.   Plan: RN CM will make outreach attempt to patient within a week.  Enzo Montgomery, RN,BSN,CCM [facility] Management Telephonic Care Management Coordinator Direct Phone: [phone] Toll Free: [phone] Fax: [phone]

## 2015-05-27 ENCOUNTER — Ambulatory Visit: Payer: Commercial Managed Care - HMO | Admitting: Oncology

## 2015-05-27 ENCOUNTER — Inpatient Hospital Stay: Payer: Commercial Managed Care - HMO | Attending: Oncology

## 2015-05-27 ENCOUNTER — Ambulatory Visit
Admission: RE | Admit: 2015-05-27 | Discharge: 2015-05-27 | Disposition: A | Discharge status: Home / Self Care | Payer: Commercial Managed Care - HMO | Source: Ambulatory Visit | Attending: Radiation Oncology | Admitting: Radiation Oncology

## 2015-05-27 DIAGNOSIS — Z510 Encounter for antineoplastic radiation therapy: Secondary | ICD-10-CM | POA: Diagnosis not present

## 2015-05-27 DIAGNOSIS — E871 Hypo-osmolality and hyponatremia: Secondary | ICD-10-CM | POA: Insufficient documentation

## 2015-05-27 DIAGNOSIS — Z87891 Personal history of nicotine dependence: Secondary | ICD-10-CM | POA: Insufficient documentation

## 2015-05-27 DIAGNOSIS — Z7984 Long term (current) use of oral hypoglycemic drugs: Secondary | ICD-10-CM | POA: Insufficient documentation

## 2015-05-27 DIAGNOSIS — R062 Wheezing: Secondary | ICD-10-CM | POA: Insufficient documentation

## 2015-05-27 DIAGNOSIS — C3490 Malignant neoplasm of unspecified part of unspecified bronchus or lung: Secondary | ICD-10-CM | POA: Insufficient documentation

## 2015-05-27 DIAGNOSIS — M818 Other osteoporosis without current pathological fracture: Secondary | ICD-10-CM | POA: Insufficient documentation

## 2015-05-27 DIAGNOSIS — E876 Hypokalemia: Secondary | ICD-10-CM | POA: Insufficient documentation

## 2015-05-27 DIAGNOSIS — Z79899 Other long term (current) drug therapy: Secondary | ICD-10-CM | POA: Insufficient documentation

## 2015-05-27 DIAGNOSIS — Z853 Personal history of malignant neoplasm of breast: Secondary | ICD-10-CM | POA: Insufficient documentation

## 2015-05-27 DIAGNOSIS — E119 Type 2 diabetes mellitus without complications: Secondary | ICD-10-CM | POA: Insufficient documentation

## 2015-05-27 DIAGNOSIS — E162 Hypoglycemia, unspecified: Secondary | ICD-10-CM | POA: Insufficient documentation

## 2015-05-27 DIAGNOSIS — Z5111 Encounter for antineoplastic chemotherapy: Secondary | ICD-10-CM | POA: Insufficient documentation

## 2015-05-27 DIAGNOSIS — C7951 Secondary malignant neoplasm of bone: Secondary | ICD-10-CM | POA: Insufficient documentation

## 2015-05-27 DIAGNOSIS — R197 Diarrhea, unspecified: Secondary | ICD-10-CM | POA: Insufficient documentation

## 2015-05-27 DIAGNOSIS — R112 Nausea with vomiting, unspecified: Secondary | ICD-10-CM | POA: Insufficient documentation

## 2015-05-27 DIAGNOSIS — I959 Hypotension, unspecified: Secondary | ICD-10-CM | POA: Insufficient documentation

## 2015-05-27 DIAGNOSIS — I10 Essential (primary) hypertension: Secondary | ICD-10-CM | POA: Insufficient documentation

## 2015-05-27 DIAGNOSIS — R1310 Dysphagia, unspecified: Secondary | ICD-10-CM | POA: Insufficient documentation

## 2015-05-28 ENCOUNTER — Ambulatory Visit: Payer: Self-pay

## 2015-05-28 ENCOUNTER — Ambulatory Visit
Admission: RE | Admit: 2015-05-28 | Discharge: 2015-05-28 | Disposition: A | Discharge status: Home / Self Care | Payer: Commercial Managed Care - HMO | Source: Ambulatory Visit | Attending: Radiation Oncology | Admitting: Radiation Oncology

## 2015-05-28 ENCOUNTER — Other Ambulatory Visit: Payer: Self-pay

## 2015-05-28 DIAGNOSIS — Z510 Encounter for antineoplastic radiation therapy: Secondary | ICD-10-CM | POA: Diagnosis not present

## 2015-05-28 NOTE — Patient Outreach (Signed)
[facility]) Care Management  05/28/2015  Madison Fry [birth year] [id number]     Telephone Screen  Referral Date: 05/20/15 Referral Source: [employer] Referral Reason: "disease and symptom management, new dx of lung mass"   Outreach attempt #3 to patient. Dtr states patient is asleep.    Plan: RN CM will send unsuccessful outreach letter to patient and close case if no response from patient within 10 business days.   Enzo Montgomery, RN,BSN,CCM [facility] Management Telephonic Care Management Coordinator Direct Phone: [phone] Toll Free: [phone] Fax: [phone]

## 2015-05-29 ENCOUNTER — Ambulatory Visit
Admission: RE | Admit: 2015-05-29 | Discharge: 2015-05-29 | Disposition: A | Discharge status: Home / Self Care | Payer: Commercial Managed Care - HMO | Source: Ambulatory Visit | Attending: Radiation Oncology | Admitting: Radiation Oncology

## 2015-05-29 DIAGNOSIS — Z510 Encounter for antineoplastic radiation therapy: Secondary | ICD-10-CM | POA: Diagnosis not present

## 2015-05-29 MED ORDER — ONDANSETRON HCL 8 MG PO TABS: 8.0000 mg | ORAL_TABLET | Freq: Two times a day (BID) | ORAL | Status: DC | PRN

## 2015-05-29 NOTE — Progress Notes (Signed)
[place]  [phone] Fax:(336) [phone]  ID: Madison Fry OB: 05/13/1928  MR#: [id number]  OZD#:[id number]  Patient Care Team: Mayra Neer, MD as PCP - General Kandra Nicolas Randal Buba, RN as [place] Management  CHIEF COMPLAINT:  Chief Complaint  Patient presents with  . Lung Cancer    INTERVAL HISTORY: Patient returns to clinic today for further evaluation and consideration of additional treatment planning. She has now agreed to palliative XRT and also has agreed to concurrent low-dose chemotherapy. She continues to be anxious, but otherwise feels well. Her shortness of breath and occasional wheezing are unchanged. She has no fevers.  She has good appetite and has maintained her weight.  She has no chest pain.  She denies any nausea, vomiting, constipation, or diarrhea.  She has no urinary complaints.  Patient offers no further specific complaints today.  REVIEW OF SYSTEMS:   Review of Systems  Constitutional: Negative.  Negative for fever and malaise/fatigue.  Respiratory: Positive for cough, shortness of breath and wheezing. Negative for hemoptysis.   Cardiovascular: Negative.  Negative for chest pain.  Gastrointestinal: Negative.   Musculoskeletal: Negative.   Neurological: Negative.  Negative for weakness.  Psychiatric/Behavioral: The patient is nervous/anxious.     As per HPI. Otherwise, a complete review of systems is negatve.  PAST MEDICAL HISTORY: Past Medical History  Diagnosis Date  . Diabetes mellitus without complication ([place])   . Hypertension   . Cancer ([place])     breast cancer, Right side lymph nodes removed    PAST SURGICAL HISTORY: Right colectomy for benign disease, right lumpectomy.  FAMILY HISTORY: Reviewed and unchanged. No reported history of malignancy or chronic disease.     ADVANCED DIRECTIVES:    HEALTH MAINTENANCE: Social History  Substance Use Topics  . Smoking status:  Former Smoker -- 1.50 packs/day    Types: Cigarettes    Quit date: 08/27/1990  . Smokeless tobacco: Never Used  . Alcohol Use: No     Colonoscopy:  PAP:  Bone density:  Lipid panel:  Allergies  Allergen Reactions  . Shellfish Allergy Swelling    Current Outpatient Prescriptions  Medication Sig Dispense Refill  . albuterol (PROVENTIL) (2.5 MG/3ML) 0.083% nebulizer solution Take 3 mLs (2.5 mg total) by nebulization 3 (three) times daily. 252 mL 3  . colesevelam (WELCHOL) 625 MG tablet Take by mouth 2 (two) times daily with a meal.    . COMBIGAN 0.2-0.5 % ophthalmic solution PUT 1 DROP IN AFFECTED EYE EVERY 12 HOURS**NEED OFFICE VISIT**  0  . ezetimibe (ZETIA) 10 MG tablet Take 10 mg by mouth daily.    Marland Kitchen glimepiride (AMARYL) 2 MG tablet Take 2 mg by mouth daily with breakfast.    . HYDROcodone-acetaminophen (NORCO/VICODIN) 5-325 MG tablet TAKE 1-2 TABLETS BY MOUTH EVERY 8 HOURS AS NEEDED FOR PAIN  0  . levothyroxine (SYNTHROID, LEVOTHROID) 50 MCG tablet Take 50 mcg by mouth daily.    Marland Kitchen LORazepam (ATIVAN) 0.5 MG tablet     . losartan (COZAAR) 100 MG tablet Take 100 mg by mouth daily.    . metFORMIN (GLUCOPHAGE-XR) 500 MG 24 hr tablet TAKE 1 TABLET BY MOUTH WITH MEALS  2  . TRAVATAN Z 0.004 % SOLN ophthalmic solution PUT 1 DROP INTO BOTH EYES AT BEDTIME  0  . verapamil (CALAN-SR) 120 MG CR tablet Take 120 mg by mouth daily.    . ondansetron (ZOFRAN) 8 MG tablet Take 1 tablet (8 mg total) by mouth 2 (  two) times daily as needed. Start on the third day after chemotherapy. 30 tablet 1   No current facility-administered medications for this visit.    OBJECTIVE: Filed Vitals:   05/23/15 1155  BP: 133/74  Pulse: 78  Temp: 99 F (37.2 C)  Resp: 16     There is no weight on file to calculate BMI.    ECOG FS:0 - Asymptomatic  General: Well-developed, well-nourished, no acute distress. Eyes: Pink conjunctiva, anicteric sclera. Lungs: Scattered wheezing throughout. Heart: Regular rate  and rhythm. No rubs, murmurs, or gallops. Abdomen: Soft, nontender, nondistended. No organomegaly noted, normoactive bowel sounds. Musculoskeletal: No edema, cyanosis, or clubbing. Neuro: Alert, answering all questions appropriately. Cranial nerves grossly intact. Skin: No rashes or petechiae noted. Psych: Normal affect.  LAB RESULTS:  Lab Results  Component Value Date   NA 129* 05/02/2015   K 3.7 05/02/2015   CL 98* 05/02/2015   CO2 24 05/02/2015   GLUCOSE 102* 05/02/2015   BUN 17 05/02/2015   CREATININE 0.56 05/02/2015   CALCIUM 8.8* 05/02/2015   PROT 7.0 05/02/2015   ALBUMIN 3.5 05/02/2015   AST 19 05/02/2015   ALT 9* 05/02/2015   ALKPHOS 71 05/02/2015   BILITOT 0.4 05/02/2015   GFRNONAA >60 05/02/2015   GFRAA >60 05/02/2015    Lab Results  Component Value Date   WBC 12.4* 05/02/2015   NEUTROABS 10.6* 05/02/2015   HGB 13.7 05/02/2015   HCT 40.4 05/02/2015   MCV 89.7 05/02/2015   PLT 316 05/02/2015     STUDIES: Mr Jeri Cos ZJ Contrast  05/31/15  CLINICAL DATA:  Lung cancer.  Staging. EXAM: MRI HEAD WITHOUT AND WITH CONTRAST TECHNIQUE: Multiplanar, multiecho pulse sequences of the brain and surrounding structures were obtained without and with intravenous contrast. CONTRAST:  37m MULTIHANCE GADOBENATE DIMEGLUMINE 529 MG/ML IV SOLN COMPARISON:  None. FINDINGS: Moderate atrophy.  Negative for hydrocephalus Negative for acute infarct. Multiple nonenhancing white matter hyperintensities bilaterally consistent with chronic microvascular ischemia. Mild chronic ischemia in the pons. Negative for intracranial hemorrhage. Negative for mass or edema Postcontrast imaging reveals no evidence of metastatic disease. No enhancing mass lesion. Leptomeningeal enhancement normal. Bone marrow lesion C3 vertebral body on the right consistent with metastatic disease. No skull lesion identified. Skullbase intact. Small subdural fluid collection posterior to the C2 vertebral body on the right  which does not enhance and is of uncertain etiology. Cervical MRI suggested for further evaluation as this could be related to the C3 metastatic disease. Normal orbit.  Paranasal sinuses clear.  Pituitary normal in size. IMPRESSION: Atrophy and chronic microvascular ischemia.  No acute infarct Negative for metastatic disease to the brain Metastatic deposit to the C3 vertebral body. Subdural effusion on the right behind the C2 vertebral body. This does not enhance. MRI of the cervical spine without with contrast suggested further evaluation. Electronically Signed   By: CFranchot GalloM.D.   On: 02017-05-511:24    ASSESSMENT: Stage Ia adenocarcinoma of the right breast, now with stage IV small cell lung cancer with multiple bony lesions.   PLAN:    1. Small cell lung cancer: CT and PET scan results reviewed independently and reported widespread bony metastasis. MRI the brain did not reveal any intracranial abnormality. Patient will initiate palliative XRT for symptomatic control of her lung in the next week. Patient has now changed her mind and has agreed to concurrent chemotherapy with her XRT. Plan to use low-dose cisplatin weekly for 2-3 cycles until the conclusion of  her XRT on Jun 13, 2015. Return to clinic in 1 week to initiate cycle 1. Will add in Zometa at the conclusion of her XRT.  2. Breast cancer: Patient completed 5 years of tamoxifen, no further workup or intervention is needed at this time. 3. Osteoporosis: Continue calcium and vitamin D supplementation. Patient will also be receiving Zometa for her bony metastasis. 4. Hyponatremia: Likely secondary to underlying malignancy, monitor. 5. Bony metastasis: Zometa as above.  Approximately 30 minutes was spent in discussion of which greater than 50% was consultation.  Patient expressed understanding and was in agreement with this plan. She also understands that She can call clinic at any time with any questions, concerns, or complaints.    Lloyd Huger, MD   05/29/2015 10:32 PM

## 2015-05-30 ENCOUNTER — Ambulatory Visit
Admission: RE | Admit: 2015-05-30 | Discharge: 2015-05-30 | Disposition: A | Discharge status: Home / Self Care | Payer: Commercial Managed Care - HMO | Source: Ambulatory Visit | Attending: Radiation Oncology | Admitting: Radiation Oncology

## 2015-05-30 DIAGNOSIS — Z510 Encounter for antineoplastic radiation therapy: Secondary | ICD-10-CM | POA: Diagnosis not present

## 2015-05-31 ENCOUNTER — Ambulatory Visit
Admission: RE | Admit: 2015-05-31 | Discharge: 2015-05-31 | Disposition: A | Discharge status: Home / Self Care | Payer: Commercial Managed Care - HMO | Source: Ambulatory Visit | Attending: Radiation Oncology | Admitting: Radiation Oncology

## 2015-05-31 DIAGNOSIS — Z510 Encounter for antineoplastic radiation therapy: Secondary | ICD-10-CM | POA: Diagnosis not present

## 2015-06-03 ENCOUNTER — Ambulatory Visit
Admission: RE | Admit: 2015-06-03 | Discharge: 2015-06-03 | Disposition: A | Discharge status: Home / Self Care | Payer: Commercial Managed Care - HMO | Source: Ambulatory Visit | Attending: Radiation Oncology | Admitting: Radiation Oncology

## 2015-06-03 DIAGNOSIS — Z510 Encounter for antineoplastic radiation therapy: Secondary | ICD-10-CM | POA: Diagnosis not present

## 2015-06-03 NOTE — Patient Instructions (Signed)
Cisplatin injection What is this medicine? CISPLATIN (SIS pla tin) is a chemotherapy drug. It targets fast dividing cells, like cancer cells, and causes these cells to die. This medicine is used to treat many types of cancer like bladder, ovarian, and testicular cancers. This medicine may be used for other purposes; ask your health care provider or pharmacist if you have questions. What should I tell my health care provider before I take this medicine? They need to know if you have any of these conditions: -blood disorders -hearing problems -kidney disease -recent or ongoing radiation therapy -an unusual or allergic reaction to cisplatin, carboplatin, other chemotherapy, other medicines, foods, dyes, or preservatives -pregnant or trying to get pregnant -breast-feeding How should I use this medicine? This drug is given as an infusion into a vein. It is administered in a hospital or clinic by a specially trained health care professional. Talk to your pediatrician regarding the use of this medicine in children. Special care may be needed. Overdosage: If you think you have taken too much of this medicine contact a poison control center or emergency room at once. NOTE: This medicine is only for you. Do not share this medicine with others. What if I miss a dose? It is important not to miss a dose. Call your doctor or health care professional if you are unable to keep an appointment. What may interact with this medicine? -dofetilide -foscarnet -medicines for seizures -medicines to increase blood counts like filgrastim, pegfilgrastim, sargramostim -probenecid -pyridoxine used with altretamine -rituximab -some antibiotics like amikacin, gentamicin, neomycin, polymyxin B, streptomycin, tobramycin -sulfinpyrazone -vaccines -zalcitabine Talk to your doctor or health care professional before taking any of these medicines: -acetaminophen -aspirin -ibuprofen -ketoprofen -naproxen This list may  not describe all possible interactions. Give your health care provider a list of all the medicines, herbs, non-prescription drugs, or dietary supplements you use. Also tell them if you smoke, drink alcohol, or use illegal drugs. Some items may interact with your medicine. What should I watch for while using this medicine? Your condition will be monitored carefully while you are receiving this medicine. You will need important blood work done while you are taking this medicine. This drug may make you feel generally unwell. This is not uncommon, as chemotherapy can affect healthy cells as well as cancer cells. Report any side effects. Continue your course of treatment even though you feel ill unless your doctor tells you to stop. In some cases, you may be given additional medicines to help with side effects. Follow all directions for their use. Call your doctor or health care professional for advice if you get a fever, chills or sore throat, or other symptoms of a cold or flu. Do not treat yourself. This drug decreases your body's ability to fight infections. Try to avoid being around people who are sick. This medicine may increase your risk to bruise or bleed. Call your doctor or health care professional if you notice any unusual bleeding. Be careful brushing and flossing your teeth or using a toothpick because you may get an infection or bleed more easily. If you have any dental work done, tell your dentist you are receiving this medicine. Avoid taking products that contain aspirin, acetaminophen, ibuprofen, naproxen, or ketoprofen unless instructed by your doctor. These medicines may hide a fever. Do not become pregnant while taking this medicine. Women should inform their doctor if they wish to become pregnant or think they might be pregnant. There is a potential for serious side effects to   an unborn child. Talk to your health care professional or pharmacist for more information. Do not breast-feed an  infant while taking this medicine. Drink fluids as directed while you are taking this medicine. This will help protect your kidneys. Call your doctor or health care professional if you get diarrhea. Do not treat yourself. What side effects may I notice from receiving this medicine? Side effects that you should report to your doctor or health care professional as soon as possible: -allergic reactions like skin rash, itching or hives, swelling of the face, lips, or tongue -signs of infection - fever or chills, cough, sore throat, pain or difficulty passing urine -signs of decreased platelets or bleeding - bruising, pinpoint red spots on the skin, black, tarry stools, nosebleeds -signs of decreased red blood cells - unusually weak or tired, fainting spells, lightheadedness -breathing problems -changes in hearing -gout pain -low blood counts - This drug may decrease the number of white blood cells, red blood cells and platelets. You may be at increased risk for infections and bleeding. -nausea and vomiting -pain, swelling, redness or irritation at the injection site -pain, tingling, numbness in the hands or feet -problems with balance, movement -trouble passing urine or change in the amount of urine Side effects that usually do not require medical attention (report to your doctor or health care professional if they continue or are bothersome): -changes in vision -loss of appetite -metallic taste in the mouth or changes in taste This list may not describe all possible side effects. Call your doctor for medical advice about side effects. You may report side effects to FDA at 1-800-FDA-1088. Where should I keep my medicine? This drug is given in a hospital or clinic and will not be stored at home. NOTE: This sheet is a summary. It may not cover all possible information. If you have questions about this medicine, talk to your doctor, pharmacist, or health care provider.    2016, [employer]vier/[employer]  Standard. (2007-04-19 14:40:54)  

## 2015-06-04 ENCOUNTER — Ambulatory Visit: Payer: Commercial Managed Care - HMO | Admitting: Oncology

## 2015-06-04 ENCOUNTER — Inpatient Hospital Stay (HOSPITAL_BASED_OUTPATIENT_CLINIC_OR_DEPARTMENT_OTHER): Payer: Commercial Managed Care - HMO | Admitting: Oncology

## 2015-06-04 ENCOUNTER — Ambulatory Visit
Admission: RE | Admit: 2015-06-04 | Discharge: 2015-06-04 | Disposition: A | Discharge status: Home / Self Care | Payer: Commercial Managed Care - HMO | Source: Ambulatory Visit | Attending: Radiation Oncology | Admitting: Radiation Oncology

## 2015-06-04 ENCOUNTER — Inpatient Hospital Stay: Payer: Commercial Managed Care - HMO

## 2015-06-04 ENCOUNTER — Ambulatory Visit: Payer: Commercial Managed Care - HMO

## 2015-06-04 ENCOUNTER — Other Ambulatory Visit: Payer: Self-pay | Admitting: *Deleted

## 2015-06-04 VITALS — BP 118/66 | HR 86 | Temp 99.9°F | Resp 18

## 2015-06-04 DIAGNOSIS — Z5111 Encounter for antineoplastic chemotherapy: Secondary | ICD-10-CM | POA: Diagnosis present

## 2015-06-04 DIAGNOSIS — E876 Hypokalemia: Secondary | ICD-10-CM | POA: Diagnosis not present

## 2015-06-04 DIAGNOSIS — E871 Hypo-osmolality and hyponatremia: Secondary | ICD-10-CM | POA: Diagnosis not present

## 2015-06-04 DIAGNOSIS — Z853 Personal history of malignant neoplasm of breast: Secondary | ICD-10-CM

## 2015-06-04 DIAGNOSIS — Z510 Encounter for antineoplastic radiation therapy: Secondary | ICD-10-CM | POA: Diagnosis not present

## 2015-06-04 DIAGNOSIS — Z79899 Other long term (current) drug therapy: Secondary | ICD-10-CM | POA: Diagnosis not present

## 2015-06-04 DIAGNOSIS — R197 Diarrhea, unspecified: Secondary | ICD-10-CM | POA: Diagnosis not present

## 2015-06-04 DIAGNOSIS — Z7984 Long term (current) use of oral hypoglycemic drugs: Secondary | ICD-10-CM

## 2015-06-04 DIAGNOSIS — I959 Hypotension, unspecified: Secondary | ICD-10-CM | POA: Diagnosis not present

## 2015-06-04 DIAGNOSIS — E119 Type 2 diabetes mellitus without complications: Secondary | ICD-10-CM | POA: Diagnosis not present

## 2015-06-04 DIAGNOSIS — E162 Hypoglycemia, unspecified: Secondary | ICD-10-CM | POA: Diagnosis not present

## 2015-06-04 DIAGNOSIS — I10 Essential (primary) hypertension: Secondary | ICD-10-CM

## 2015-06-04 DIAGNOSIS — C3490 Malignant neoplasm of unspecified part of unspecified bronchus or lung: Secondary | ICD-10-CM

## 2015-06-04 DIAGNOSIS — Z87891 Personal history of nicotine dependence: Secondary | ICD-10-CM | POA: Diagnosis not present

## 2015-06-04 DIAGNOSIS — R1310 Dysphagia, unspecified: Secondary | ICD-10-CM

## 2015-06-04 DIAGNOSIS — C7951 Secondary malignant neoplasm of bone: Secondary | ICD-10-CM | POA: Diagnosis not present

## 2015-06-04 DIAGNOSIS — M818 Other osteoporosis without current pathological fracture: Secondary | ICD-10-CM | POA: Diagnosis not present

## 2015-06-04 DIAGNOSIS — R112 Nausea with vomiting, unspecified: Secondary | ICD-10-CM | POA: Diagnosis not present

## 2015-06-04 DIAGNOSIS — C3491 Malignant neoplasm of unspecified part of right bronchus or lung: Secondary | ICD-10-CM

## 2015-06-04 DIAGNOSIS — R062 Wheezing: Secondary | ICD-10-CM | POA: Diagnosis not present

## 2015-06-04 LAB — CBC WITH DIFFERENTIAL/PLATELET
Basophils Absolute: 0.1 10*3/uL (ref 0–0.1)
Basophils Relative: 1 %
Eosinophils Absolute: 0.1 10*3/uL (ref 0–0.7)
Eosinophils Relative: 2 %
HEMATOCRIT: 39 % (ref 35.0–47.0)
HEMOGLOBIN: 13.1 g/dL (ref 12.0–16.0)
LYMPHS ABS: 0.4 10*3/uL — AB (ref 1.0–3.6)
LYMPHS PCT: 5 %
MCH: 30.4 pg (ref 26.0–34.0)
MCHC: 33.7 g/dL (ref 32.0–36.0)
MCV: 90.3 fL (ref 80.0–100.0)
MONOS PCT: 8 %
Monocytes Absolute: 0.6 10*3/uL (ref 0.2–0.9)
NEUTROS ABS: 6.5 10*3/uL (ref 1.4–6.5)
NEUTROS PCT: 84 %
Platelets: 240 10*3/uL (ref 150–440)
RBC: 4.32 MIL/uL (ref 3.80–5.20)
RDW: 15.6 % — ABNORMAL HIGH (ref 11.5–14.5)
WBC: 7.7 10*3/uL (ref 3.6–11.0)

## 2015-06-04 LAB — BASIC METABOLIC PANEL
ANION GAP: 9 (ref 5–15)
BUN: 17 mg/dL (ref 6–20)
CHLORIDE: 101 mmol/L (ref 101–111)
CO2: 27 mmol/L (ref 22–32)
CREATININE: 0.96 mg/dL (ref 0.44–1.00)
Calcium: 9.4 mg/dL (ref 8.9–10.3)
GFR calc non Af Amer: 53 mL/min — ABNORMAL LOW (ref >60)
Glucose, Bld: 198 mg/dL — ABNORMAL HIGH (ref 65–99)
Potassium: 4.1 mmol/L (ref 3.5–5.1)
Sodium: 137 mmol/L (ref 135–145)

## 2015-06-04 MED ORDER — SUCRALFATE 1 G PO TABS: 1.0000 g | ORAL_TABLET | Freq: Three times a day (TID) | ORAL | Status: DC

## 2015-06-04 NOTE — Progress Notes (Signed)
Patient attended the chemo class today and is currently receiving XRT.  She is having difficulty swallowing so Dr. Baruch Gouty prescribed Carafate.

## 2015-06-05 ENCOUNTER — Ambulatory Visit
Admission: RE | Admit: 2015-06-05 | Discharge: 2015-06-05 | Disposition: A | Discharge status: Home / Self Care | Payer: Commercial Managed Care - HMO | Source: Ambulatory Visit | Attending: Radiation Oncology | Admitting: Radiation Oncology

## 2015-06-05 DIAGNOSIS — Z510 Encounter for antineoplastic radiation therapy: Secondary | ICD-10-CM | POA: Diagnosis not present

## 2015-06-06 ENCOUNTER — Inpatient Hospital Stay: Payer: Commercial Managed Care - HMO

## 2015-06-06 ENCOUNTER — Ambulatory Visit
Admission: RE | Admit: 2015-06-06 | Discharge: 2015-06-06 | Disposition: A | Discharge status: Home / Self Care | Payer: Commercial Managed Care - HMO | Source: Ambulatory Visit | Attending: Radiation Oncology | Admitting: Radiation Oncology

## 2015-06-06 ENCOUNTER — Ambulatory Visit: Payer: Commercial Managed Care - HMO

## 2015-06-06 VITALS — BP 121/68 | HR 80 | Temp 98.4°F | Resp 18

## 2015-06-06 DIAGNOSIS — Z510 Encounter for antineoplastic radiation therapy: Secondary | ICD-10-CM | POA: Diagnosis not present

## 2015-06-06 DIAGNOSIS — Z5111 Encounter for antineoplastic chemotherapy: Secondary | ICD-10-CM | POA: Diagnosis not present

## 2015-06-06 DIAGNOSIS — C3491 Malignant neoplasm of unspecified part of right bronchus or lung: Secondary | ICD-10-CM

## 2015-06-06 MED ORDER — PALONOSETRON HCL INJECTION 0.25 MG/5ML
0.2500 mg | Freq: Once | INTRAVENOUS | Status: AC
  Administered 2015-06-06: 0.25 mg via INTRAVENOUS
  Filled 2015-06-06: qty 5

## 2015-06-06 MED ORDER — SODIUM CHLORIDE 0.9 % IV SOLN
Freq: Once | INTRAVENOUS | Status: AC
  Administered 2015-06-06: 09:00:00 via INTRAVENOUS
  Filled 2015-06-06: qty 1000

## 2015-06-06 MED ORDER — DEXAMETHASONE SODIUM PHOSPHATE 100 MG/10ML IJ SOLN
Freq: Once | INTRAMUSCULAR | Status: AC
Start: 2015-06-06 — End: 2015-06-06
  Administered 2015-06-06: 12:00:00 via INTRAVENOUS
  Filled 2015-06-06: qty 5

## 2015-06-06 MED ORDER — CISPLATIN CHEMO INJECTION 100MG/100ML
30.0000 mg/m2 | Freq: Once | INTRAVENOUS | Status: AC
  Administered 2015-06-06: 56 mg via INTRAVENOUS
  Filled 2015-06-06: qty 56

## 2015-06-06 MED ORDER — POTASSIUM CHLORIDE 2 MEQ/ML IV SOLN
Freq: Once | INTRAVENOUS | Status: AC
  Administered 2015-06-06: 09:00:00 via INTRAVENOUS
  Filled 2015-06-06: qty 1000

## 2015-06-07 ENCOUNTER — Ambulatory Visit
Admission: RE | Admit: 2015-06-07 | Discharge: 2015-06-07 | Disposition: A | Discharge status: Home / Self Care | Payer: Commercial Managed Care - HMO | Source: Ambulatory Visit | Attending: Radiation Oncology | Admitting: Radiation Oncology

## 2015-06-07 DIAGNOSIS — Z510 Encounter for antineoplastic radiation therapy: Secondary | ICD-10-CM | POA: Diagnosis not present

## 2015-06-10 ENCOUNTER — Ambulatory Visit: Payer: Commercial Managed Care - HMO

## 2015-06-10 ENCOUNTER — Ambulatory Visit
Admission: RE | Admit: 2015-06-10 | Discharge: 2015-06-10 | Disposition: A | Discharge status: Home / Self Care | Payer: Commercial Managed Care - HMO | Source: Ambulatory Visit | Attending: Radiation Oncology | Admitting: Radiation Oncology

## 2015-06-10 DIAGNOSIS — Z510 Encounter for antineoplastic radiation therapy: Secondary | ICD-10-CM | POA: Diagnosis not present

## 2015-06-11 ENCOUNTER — Other Ambulatory Visit: Payer: Self-pay

## 2015-06-11 ENCOUNTER — Ambulatory Visit: Payer: Commercial Managed Care - HMO

## 2015-06-11 ENCOUNTER — Ambulatory Visit
Admission: RE | Admit: 2015-06-11 | Discharge: 2015-06-11 | Disposition: A | Discharge status: Home / Self Care | Payer: Commercial Managed Care - HMO | Source: Ambulatory Visit | Attending: Radiation Oncology | Admitting: Radiation Oncology

## 2015-06-11 DIAGNOSIS — Z510 Encounter for antineoplastic radiation therapy: Secondary | ICD-10-CM | POA: Diagnosis not present

## 2015-06-11 NOTE — Patient Outreach (Signed)
[facility]) Care Management  06/11/2015  Madison Fry [birth year] [id number]   Telephone Screen  Referral Date: 05/20/15 Referral Source: [employer] Referral Reason: "disease and symptom management, new dx of lung mass"   Multiple attempts to establish contact with patient. No response from unsuccessful outreach letter. Case is being closed at this time.   Plan: RN CM will notify [facility] administrative assistant of case status. RN CM will send MD case closure letter.   Enzo Montgomery, RN,BSN,CCM [facility] Telephonic Care Management Coordinator Direct Phone: [phone] Toll Free: [phone] Fax: [phone]

## 2015-06-12 ENCOUNTER — Ambulatory Visit
Admission: RE | Admit: 2015-06-12 | Discharge: 2015-06-12 | Disposition: A | Discharge status: Home / Self Care | Payer: Commercial Managed Care - HMO | Source: Ambulatory Visit | Attending: Radiation Oncology | Admitting: Radiation Oncology

## 2015-06-12 ENCOUNTER — Ambulatory Visit: Payer: Commercial Managed Care - HMO

## 2015-06-12 DIAGNOSIS — Z510 Encounter for antineoplastic radiation therapy: Secondary | ICD-10-CM | POA: Diagnosis not present

## 2015-06-13 ENCOUNTER — Ambulatory Visit
Admission: RE | Admit: 2015-06-13 | Discharge: 2015-06-13 | Disposition: A | Discharge status: Home / Self Care | Payer: Commercial Managed Care - HMO | Source: Ambulatory Visit | Attending: Radiation Oncology | Admitting: Radiation Oncology

## 2015-06-13 ENCOUNTER — Inpatient Hospital Stay: Payer: Commercial Managed Care - HMO

## 2015-06-13 ENCOUNTER — Ambulatory Visit: Payer: Commercial Managed Care - HMO

## 2015-06-13 ENCOUNTER — Inpatient Hospital Stay (HOSPITAL_BASED_OUTPATIENT_CLINIC_OR_DEPARTMENT_OTHER): Payer: Commercial Managed Care - HMO | Admitting: Oncology

## 2015-06-13 VITALS — BP 121/66 | HR 92 | Temp 98.1°F | Resp 16 | Wt 163.4 lb

## 2015-06-13 DIAGNOSIS — Z87891 Personal history of nicotine dependence: Secondary | ICD-10-CM

## 2015-06-13 DIAGNOSIS — M818 Other osteoporosis without current pathological fracture: Secondary | ICD-10-CM | POA: Diagnosis not present

## 2015-06-13 DIAGNOSIS — Z853 Personal history of malignant neoplasm of breast: Secondary | ICD-10-CM

## 2015-06-13 DIAGNOSIS — R300 Dysuria: Secondary | ICD-10-CM

## 2015-06-13 DIAGNOSIS — C7951 Secondary malignant neoplasm of bone: Secondary | ICD-10-CM

## 2015-06-13 DIAGNOSIS — A499 Bacterial infection, unspecified: Secondary | ICD-10-CM

## 2015-06-13 DIAGNOSIS — Z510 Encounter for antineoplastic radiation therapy: Secondary | ICD-10-CM | POA: Diagnosis not present

## 2015-06-13 DIAGNOSIS — C3491 Malignant neoplasm of unspecified part of right bronchus or lung: Secondary | ICD-10-CM

## 2015-06-13 DIAGNOSIS — Z79899 Other long term (current) drug therapy: Secondary | ICD-10-CM

## 2015-06-13 DIAGNOSIS — E871 Hypo-osmolality and hyponatremia: Secondary | ICD-10-CM

## 2015-06-13 DIAGNOSIS — C3490 Malignant neoplasm of unspecified part of unspecified bronchus or lung: Secondary | ICD-10-CM | POA: Diagnosis not present

## 2015-06-13 DIAGNOSIS — Z7984 Long term (current) use of oral hypoglycemic drugs: Secondary | ICD-10-CM

## 2015-06-13 DIAGNOSIS — E119 Type 2 diabetes mellitus without complications: Secondary | ICD-10-CM

## 2015-06-13 DIAGNOSIS — Z5111 Encounter for antineoplastic chemotherapy: Secondary | ICD-10-CM | POA: Diagnosis not present

## 2015-06-13 DIAGNOSIS — R1310 Dysphagia, unspecified: Secondary | ICD-10-CM

## 2015-06-13 DIAGNOSIS — N390 Urinary tract infection, site not specified: Secondary | ICD-10-CM

## 2015-06-13 DIAGNOSIS — I10 Essential (primary) hypertension: Secondary | ICD-10-CM

## 2015-06-13 LAB — URINALYSIS COMPLETE WITH MICROSCOPIC (ARMC ONLY)
BILIRUBIN URINE: NEGATIVE
GLUCOSE, UA: 50 mg/dL — AB
Hgb urine dipstick: NEGATIVE
KETONES UR: NEGATIVE mg/dL
NITRITE: POSITIVE — AB
Protein, ur: 30 mg/dL — AB
SPECIFIC GRAVITY, URINE: 1.015 (ref 1.005–1.030)
SQUAMOUS EPITHELIAL / LPF: NONE SEEN
pH: 5 (ref 5.0–8.0)

## 2015-06-13 LAB — CBC WITH DIFFERENTIAL/PLATELET
BASOS ABS: 0 10*3/uL (ref 0–0.1)
BASOS PCT: 0 %
EOS ABS: 0.1 10*3/uL (ref 0–0.7)
Eosinophils Relative: 1 %
HEMATOCRIT: 35.3 % (ref 35.0–47.0)
HEMOGLOBIN: 12.2 g/dL (ref 12.0–16.0)
Lymphocytes Relative: 3 %
Lymphs Abs: 0.3 10*3/uL — ABNORMAL LOW (ref 1.0–3.6)
MCH: 30.5 pg (ref 26.0–34.0)
MCHC: 34.6 g/dL (ref 32.0–36.0)
MCV: 88.3 fL (ref 80.0–100.0)
MONO ABS: 0.5 10*3/uL (ref 0.2–0.9)
MONOS PCT: 5 %
NEUTROS ABS: 9 10*3/uL — AB (ref 1.4–6.5)
NEUTROS PCT: 91 %
Platelets: 292 10*3/uL (ref 150–440)
RBC: 4 MIL/uL (ref 3.80–5.20)
RDW: 15.3 % — AB (ref 11.5–14.5)
WBC: 10 10*3/uL (ref 3.6–11.0)

## 2015-06-13 LAB — BASIC METABOLIC PANEL
ANION GAP: 6 (ref 5–15)
BUN: 10 mg/dL (ref 6–20)
CALCIUM: 8.6 mg/dL — AB (ref 8.9–10.3)
CO2: 28 mmol/L (ref 22–32)
Chloride: 100 mmol/L — ABNORMAL LOW (ref 101–111)
Creatinine, Ser: 0.7 mg/dL (ref 0.44–1.00)
Glucose, Bld: 123 mg/dL — ABNORMAL HIGH (ref 65–99)
Potassium: 3.2 mmol/L — ABNORMAL LOW (ref 3.5–5.1)
SODIUM: 134 mmol/L — AB (ref 135–145)

## 2015-06-13 MED ORDER — SODIUM CHLORIDE 0.9 % IV SOLN
Freq: Once | INTRAVENOUS | Status: AC
  Administered 2015-06-13: 13:00:00 via INTRAVENOUS
  Filled 2015-06-13: qty 5

## 2015-06-13 MED ORDER — SODIUM CHLORIDE 0.9 % IV SOLN
Freq: Once | INTRAVENOUS | Status: AC
  Administered 2015-06-13: 11:00:00 via INTRAVENOUS
  Filled 2015-06-13: qty 1000

## 2015-06-13 MED ORDER — PALONOSETRON HCL INJECTION 0.25 MG/5ML
0.2500 mg | Freq: Once | INTRAVENOUS | Status: AC
  Administered 2015-06-13: 0.25 mg via INTRAVENOUS
  Filled 2015-06-13: qty 5

## 2015-06-13 MED ORDER — LEVOFLOXACIN 500 MG PO TABS: 500.0000 mg | ORAL_TABLET | Freq: Every day | ORAL | Status: DC

## 2015-06-13 MED ORDER — SODIUM CHLORIDE 0.9 % IV SOLN
30.0000 mg/m2 | Freq: Once | INTRAVENOUS | Status: AC
  Administered 2015-06-13: 56 mg via INTRAVENOUS
  Filled 2015-06-13: qty 56

## 2015-06-13 MED ORDER — POTASSIUM CHLORIDE 2 MEQ/ML IV SOLN
Freq: Once | INTRAVENOUS | Status: AC
  Administered 2015-06-13: 11:00:00 via INTRAVENOUS
  Filled 2015-06-13: qty 10

## 2015-06-13 NOTE — Progress Notes (Signed)
[place]  [phone] [phone]  ID: Madison Fry OB: March 14, 1928  MR#: [id number]  YOV#:[id number]  Patient Care Team: Mayra Neer, MD as PCP - General  CHIEF COMPLAINT:  Chief Complaint  Patient presents with  . Lung Cancer    INTERVAL HISTORY: Patient returns to clinic today for further evaluation and to discuss initiating concurrent chemotherapy along with her XRT.  She has some mild dysphasia from her XRT, but otherwise feels well. Her shortness of breath and occasional wheezing are unchanged. She has no fevers.  She has a good appetite and has maintained her weight.  She has no chest pain.  She denies any nausea, vomiting, constipation, or diarrhea.  She has no urinary complaints.  Patient offers no further specific complaints today.  REVIEW OF SYSTEMS:   Review of Systems  Constitutional: Negative.  Negative for fever and malaise/fatigue.  HENT: Positive for sore throat.   Respiratory: Positive for cough, shortness of breath and wheezing. Negative for hemoptysis.   Cardiovascular: Negative.  Negative for chest pain.  Gastrointestinal: Negative.   Musculoskeletal: Negative.   Neurological: Negative.  Negative for weakness.  Psychiatric/Behavioral: Negative.  The patient is not nervous/anxious.     As per HPI. Otherwise, a complete review of systems is negatve.  PAST MEDICAL HISTORY: Past Medical History  Diagnosis Date  . Diabetes mellitus without complication ([place])   . Hypertension   . Cancer ([place]     breast cancer, Right side lymph nodes removed    PAST SURGICAL HISTORY: Right colectomy for benign disease, right lumpectomy.  FAMILY HISTORY: Reviewed and unchanged. No reported history of malignancy or chronic disease.     ADVANCED DIRECTIVES:    HEALTH MAINTENANCE: Social History  Substance Use Topics  . Smoking status: Former Smoker -- 1.50 packs/day    Types: Cigarettes    Quit date: 08/27/1990  .  Smokeless tobacco: Never Used  . Alcohol Use: No     Colonoscopy:  PAP:  Bone density:  Lipid panel:  Allergies  Allergen Reactions  . Shellfish Allergy Swelling    Current Outpatient Prescriptions  Medication Sig Dispense Refill  . albuterol (PROVENTIL) (2.5 MG/3ML) 0.083% nebulizer solution Take 3 mLs (2.5 mg total) by nebulization 3 (three) times daily. 252 mL 3  . colesevelam (WELCHOL) 625 MG tablet Take by mouth 2 (two) times daily with a meal.    . COMBIGAN 0.2-0.5 % ophthalmic solution PUT 1 DROP IN AFFECTED EYE EVERY 12 HOURS**NEED OFFICE VISIT**  0  . ezetimibe (ZETIA) 10 MG tablet Take 10 mg by mouth daily.    Marland Kitchen glimepiride (AMARYL) 2 MG tablet Take 2 mg by mouth daily with breakfast.    . HYDROcodone-acetaminophen (NORCO/VICODIN) 5-325 MG tablet TAKE 1-2 TABLETS BY MOUTH EVERY 8 HOURS AS NEEDED FOR PAIN  0  . levothyroxine (SYNTHROID, LEVOTHROID) 50 MCG tablet Take 50 mcg by mouth daily.    Marland Kitchen LORazepam (ATIVAN) 0.5 MG tablet     . losartan (COZAAR) 100 MG tablet Take 100 mg by mouth daily.    . metFORMIN (GLUCOPHAGE-XR) 500 MG 24 hr tablet TAKE 1 TABLET BY MOUTH WITH MEALS  2  . ondansetron (ZOFRAN) 8 MG tablet Take 1 tablet (8 mg total) by mouth 2 (two) times daily as needed. Start on the third day after chemotherapy. 30 tablet 1  . sucralfate (CARAFATE) 1 g tablet Take 1 tablet (1 g total) by mouth 3 (three) times daily. Dissolve in 2-3 tbsp warm water, swish  and swallow. 90 tablet 3  . TRAVATAN Z 0.004 % SOLN ophthalmic solution PUT 1 DROP INTO BOTH EYES AT BEDTIME  0  . verapamil (CALAN-SR) 120 MG CR tablet Take 120 mg by mouth daily.    Marland Kitchen levofloxacin (LEVAQUIN) 500 MG tablet Take 1 tablet (500 mg total) by mouth daily. 5 tablet 0   No current facility-administered medications for this visit.    OBJECTIVE: Filed Vitals:   06/04/15 1329  BP: 118/66  Pulse: 86  Temp: 99.9 F (37.7 C)  Resp: 18     There is no weight on file to calculate BMI.    ECOG FS:0 -  Asymptomatic  General: Well-developed, well-nourished, no acute distress. Eyes: Pink conjunctiva, anicteric sclera. Lungs: Scattered wheezing throughout. Heart: Regular rate and rhythm. No rubs, murmurs, or gallops. Abdomen: Soft, nontender, nondistended. No organomegaly noted, normoactive bowel sounds. Musculoskeletal: No edema, cyanosis, or clubbing. Neuro: Alert, answering all questions appropriately. Cranial nerves grossly intact. Skin: No rashes or petechiae noted. Psych: Normal affect.  LAB RESULTS:  Lab Results  Component Value Date   NA 134* 06/13/2015   K 3.2* 06/13/2015   CL 100* 06/13/2015   CO2 28 06/13/2015   GLUCOSE 123* 06/13/2015   BUN 10 06/13/2015   CREATININE 0.70 06/13/2015   CALCIUM 8.6* 06/13/2015   PROT 7.0 05/02/2015   ALBUMIN 3.5 05/02/2015   AST 19 05/02/2015   ALT 9* 05/02/2015   ALKPHOS 71 05/02/2015   BILITOT 0.4 05/02/2015   GFRNONAA >60 06/13/2015   GFRAA >60 06/13/2015    Lab Results  Component Value Date   WBC 10.0 06/13/2015   NEUTROABS 9.0* 06/13/2015   HGB 12.2 06/13/2015   HCT 35.3 06/13/2015   MCV 88.3 06/13/2015   PLT 292 06/13/2015     STUDIES: No results found.  ASSESSMENT: Stage Ia adenocarcinoma of the right breast, now with stage IV small cell lung cancer with multiple bony lesions.   PLAN:    1. Small cell lung cancer: CT and PET scan results reviewed independently and reported widespread bony metastasis. MRI the brain did not reveal any intracranial abnormality. Continue daily XRT. Return to clinic on Thursday for cycle 1 of weekly cisplatin. Patient will complete her XRT on Jun 13, 2015. Return to clinic on May 18 for consideration of cycle 2. Will add in Zometa at the conclusion of her XRT.  2. Breast cancer: Patient completed 5 years of tamoxifen, no further workup or intervention is needed at this time. 3. Osteoporosis: Continue calcium and vitamin D supplementation. Patient will also be receiving Zometa for her  bony metastasis. 4. Hyponatremia: Likely secondary to underlying malignancy, monitor. 5. Bony metastasis: Zometa as above.  Patient expressed understanding and was in agreement with this plan. She also understands that She can call clinic at any time with any questions, concerns, or complaints.   Lloyd Huger, MD   06/13/2015 4:07 PM

## 2015-06-13 NOTE — Progress Notes (Signed)
Patient had 1 bad day after last treatment which was very fatigue and weakness.  Also had diarrhea for 2-3 days.  Has symptoms of urinary frequency with dysuria and she does have history of frequent UTIs.

## 2015-06-13 NOTE — Progress Notes (Signed)
[place]  Telephone:(336) [phone] Fax:(336) [phone]  ID: Madison Fry OB: April 12, 1928  MR#: [id number]  OLM#:[id number]  Patient Care Team: Mayra Neer, MD as PCP - General  CHIEF COMPLAINT:  Chief Complaint  Patient presents with  . Lung Cancer    INTERVAL HISTORY: Patient returns to clinic today for further evaluation and consideration of cycle 2 cisplatin. She had 2-3 bad days after cycle 1 with fatigue, weakness and diarrhea but she feels good today. She does think she has a UTI because she has urinary frequency and dysuria. Her shortness of breath and occasional wheezing are unchanged. She has no fevers.  She has good appetite and has maintained her weight.  She has no chest pain.  She denies any nausea, vomiting, constipation, or diarrhea. Patient offers no further specific complaints today.  REVIEW OF SYSTEMS:   Review of Systems  Constitutional: Negative.  Negative for fever and malaise/fatigue.  Respiratory: Positive for shortness of breath and wheezing. Negative for cough and hemoptysis.   Cardiovascular: Negative.  Negative for chest pain.  Gastrointestinal: Negative.   Genitourinary: Positive for dysuria and frequency.  Musculoskeletal: Negative.   Neurological: Negative.  Negative for weakness.  Psychiatric/Behavioral: The patient is not nervous/anxious.     As per HPI. Otherwise, a complete review of systems is negatve.  PAST MEDICAL HISTORY: Past Medical History  Diagnosis Date  . Diabetes mellitus without complication ([place])   . Hypertension   . Cancer ([place])     breast cancer, Right side lymph nodes removed    PAST SURGICAL HISTORY: Right colectomy for benign disease, right lumpectomy.  FAMILY HISTORY: Reviewed and unchanged. No reported history of malignancy or chronic disease.     ADVANCED DIRECTIVES:    HEALTH MAINTENANCE: Social History  Substance Use Topics  . Smoking status: Former Smoker -- 1.50 packs/day    Types:  Cigarettes    Quit date: 08/27/1990  . Smokeless tobacco: Never Used  . Alcohol Use: No     Allergies  Allergen Reactions  . Shellfish Allergy Swelling    Current Outpatient Prescriptions  Medication Sig Dispense Refill  . albuterol (PROVENTIL) (2.5 MG/3ML) 0.083% nebulizer solution Take 3 mLs (2.5 mg total) by nebulization 3 (three) times daily. 252 mL 3  . colesevelam (WELCHOL) 625 MG tablet Take by mouth 2 (two) times daily with a meal.    . COMBIGAN 0.2-0.5 % ophthalmic solution PUT 1 DROP IN AFFECTED EYE EVERY 12 HOURS**NEED OFFICE VISIT**  0  . ezetimibe (ZETIA) 10 MG tablet Take 10 mg by mouth daily.    Marland Kitchen glimepiride (AMARYL) 2 MG tablet Take 2 mg by mouth daily with breakfast.    . HYDROcodone-acetaminophen (NORCO/VICODIN) 5-325 MG tablet TAKE 1-2 TABLETS BY MOUTH EVERY 8 HOURS AS NEEDED FOR PAIN  0  . levothyroxine (SYNTHROID, LEVOTHROID) 50 MCG tablet Take 50 mcg by mouth daily.    Marland Kitchen LORazepam (ATIVAN) 0.5 MG tablet     . losartan (COZAAR) 100 MG tablet Take 100 mg by mouth daily.    . metFORMIN (GLUCOPHAGE-XR) 500 MG 24 hr tablet TAKE 1 TABLET BY MOUTH WITH MEALS  2  . ondansetron (ZOFRAN) 8 MG tablet Take 1 tablet (8 mg total) by mouth 2 (two) times daily as needed. Start on the third day after chemotherapy. 30 tablet 1  . sucralfate (CARAFATE) 1 g tablet Take 1 tablet (1 g total) by mouth 3 (three) times daily. Dissolve in 2-3 tbsp warm water, swish and swallow. 90 tablet  3  . TRAVATAN Z 0.004 % SOLN ophthalmic solution PUT 1 DROP INTO BOTH EYES AT BEDTIME  0  . verapamil (CALAN-SR) 120 MG CR tablet Take 120 mg by mouth daily.    Marland Kitchen levofloxacin (LEVAQUIN) 500 MG tablet Take 1 tablet (500 mg total) by mouth daily. 5 tablet 0   No current facility-administered medications for this visit.    OBJECTIVE: Filed Vitals:   06/13/15 0957  BP: 121/66  Pulse: 92  Temp: 98.1 F (36.7 C)  Resp: 16     Body mass index is 27.62 kg/(m^2).    ECOG FS:0 - Asymptomatic  General:  Well-developed, well-nourished, no acute distress. Eyes: Pink conjunctiva, anicteric sclera. Lungs: Scattered wheezing throughout. Heart: Regular rate and rhythm. No rubs, murmurs, or gallops. Abdomen: Soft, nontender, nondistended. No organomegaly noted, normoactive bowel sounds. Musculoskeletal: No edema, cyanosis, or clubbing. Neuro: Alert, answering all questions appropriately. Cranial nerves grossly intact. Skin: No rashes or petechiae noted. Psych: Normal affect.  LAB RESULTS:  Lab Results  Component Value Date   NA 134* 06/13/2015   K 3.2* 06/13/2015   CL 100* 06/13/2015   CO2 28 06/13/2015   GLUCOSE 123* 06/13/2015   BUN 10 06/13/2015   CREATININE 0.70 06/13/2015   CALCIUM 8.6* 06/13/2015   PROT 7.0 05/02/2015   ALBUMIN 3.5 05/02/2015   AST 19 05/02/2015   ALT 9* 05/02/2015   ALKPHOS 71 05/02/2015   BILITOT 0.4 05/02/2015   GFRNONAA >60 06/13/2015   GFRAA >60 06/13/2015    Lab Results  Component Value Date   WBC 10.0 06/13/2015   NEUTROABS 9.0* 06/13/2015   HGB 12.2 06/13/2015   HCT 35.3 06/13/2015   MCV 88.3 06/13/2015   PLT 292 06/13/2015     STUDIES: No results found.  ASSESSMENT: Stage Ia adenocarcinoma of the right breast, now with stage IV small cell lung cancer with multiple bony lesions.   PLAN:    1. Small cell lung cancer: CT and PET scan results reviewed independently and reported widespread bony metastasis. MRI the brain did not reveal any intracranial abnormality. She had her final radiation treatment today. Proceed with cycle 2 cisplatin today. Return to clinic in one week for lab work, evaluation and possibly cycle 3 cisplatin and zometa.  2. Breast cancer: Patient completed 5 years of tamoxifen, no further workup or intervention is needed at this time. 3. Osteoporosis: Continue calcium and vitamin D supplementation. Patient will also be receiving Zometa for her bony metastasis. 4. Hyponatremia: Likely secondary to underlying malignancy,  monitor. 5. Bony metastasis: Zometa as above. 6. Hypokalemia: Patient has potassium supplements at home she will start taking. 7. Hypocalcemia: Patient has OTC calcium and vitamin D at home but she has not been taking, she will start taking them today. 8. Urinary symptoms: Urinalysis today. Sent Levaquin '500mg'$  x 5 days to pharmacy.   Patient expressed understanding and was in agreement with this plan. She also understands that She can call clinic at any time with any questions, concerns, or complaints.   Mayra Reel, NP   06/14/2015 8:40 AM  Patient was seen and evaluated independently and I agree with the assessment and plan as dictated above.  Lloyd Huger, MD 06/14/2015 4:56 PM

## 2015-06-14 ENCOUNTER — Ambulatory Visit: Payer: Commercial Managed Care - HMO

## 2015-06-16 LAB — URINE CULTURE: Culture: >=100000 — AB

## 2015-06-20 ENCOUNTER — Inpatient Hospital Stay (HOSPITAL_BASED_OUTPATIENT_CLINIC_OR_DEPARTMENT_OTHER): Payer: Commercial Managed Care - HMO | Admitting: Oncology

## 2015-06-20 ENCOUNTER — Inpatient Hospital Stay: Payer: Commercial Managed Care - HMO

## 2015-06-20 VITALS — BP 90/61 | HR 97 | Temp 98.5°F | Resp 16

## 2015-06-20 DIAGNOSIS — I10 Essential (primary) hypertension: Secondary | ICD-10-CM

## 2015-06-20 DIAGNOSIS — E119 Type 2 diabetes mellitus without complications: Secondary | ICD-10-CM

## 2015-06-20 DIAGNOSIS — C7951 Secondary malignant neoplasm of bone: Secondary | ICD-10-CM

## 2015-06-20 DIAGNOSIS — R112 Nausea with vomiting, unspecified: Secondary | ICD-10-CM | POA: Diagnosis not present

## 2015-06-20 DIAGNOSIS — C3491 Malignant neoplasm of unspecified part of right bronchus or lung: Secondary | ICD-10-CM

## 2015-06-20 DIAGNOSIS — Z5111 Encounter for antineoplastic chemotherapy: Secondary | ICD-10-CM | POA: Diagnosis not present

## 2015-06-20 DIAGNOSIS — R197 Diarrhea, unspecified: Secondary | ICD-10-CM

## 2015-06-20 DIAGNOSIS — M818 Other osteoporosis without current pathological fracture: Secondary | ICD-10-CM

## 2015-06-20 DIAGNOSIS — R062 Wheezing: Secondary | ICD-10-CM

## 2015-06-20 DIAGNOSIS — E876 Hypokalemia: Secondary | ICD-10-CM

## 2015-06-20 DIAGNOSIS — C3490 Malignant neoplasm of unspecified part of unspecified bronchus or lung: Secondary | ICD-10-CM | POA: Diagnosis not present

## 2015-06-20 DIAGNOSIS — E162 Hypoglycemia, unspecified: Secondary | ICD-10-CM

## 2015-06-20 DIAGNOSIS — R1310 Dysphagia, unspecified: Secondary | ICD-10-CM

## 2015-06-20 DIAGNOSIS — Z87891 Personal history of nicotine dependence: Secondary | ICD-10-CM

## 2015-06-20 DIAGNOSIS — I959 Hypotension, unspecified: Secondary | ICD-10-CM

## 2015-06-20 DIAGNOSIS — Z853 Personal history of malignant neoplasm of breast: Secondary | ICD-10-CM

## 2015-06-20 DIAGNOSIS — Z7984 Long term (current) use of oral hypoglycemic drugs: Secondary | ICD-10-CM

## 2015-06-20 DIAGNOSIS — E860 Dehydration: Secondary | ICD-10-CM

## 2015-06-20 DIAGNOSIS — Z79899 Other long term (current) drug therapy: Secondary | ICD-10-CM

## 2015-06-20 LAB — BASIC METABOLIC PANEL
ANION GAP: 7 (ref 5–15)
BUN: 21 mg/dL — ABNORMAL HIGH (ref 6–20)
CALCIUM: 8.7 mg/dL — AB (ref 8.9–10.3)
CO2: 30 mmol/L (ref 22–32)
Chloride: 99 mmol/L — ABNORMAL LOW (ref 101–111)
Creatinine, Ser: 0.87 mg/dL (ref 0.44–1.00)
GFR, EST NON AFRICAN AMERICAN: 59 mL/min — AB (ref >60)
Glucose, Bld: 123 mg/dL — ABNORMAL HIGH (ref 65–99)
POTASSIUM: 3.4 mmol/L — AB (ref 3.5–5.1)
Sodium: 136 mmol/L (ref 135–145)

## 2015-06-20 LAB — CBC WITH DIFFERENTIAL/PLATELET
BASOS ABS: 0 10*3/uL (ref 0–0.1)
BASOS PCT: 0 %
EOS PCT: 1 %
Eosinophils Absolute: 0.1 10*3/uL (ref 0–0.7)
HEMATOCRIT: 36.4 % (ref 35.0–47.0)
Hemoglobin: 12.4 g/dL (ref 12.0–16.0)
Lymphocytes Relative: 3 %
Lymphs Abs: 0.3 10*3/uL — ABNORMAL LOW (ref 1.0–3.6)
MCH: 30.3 pg (ref 26.0–34.0)
MCHC: 34.1 g/dL (ref 32.0–36.0)
MCV: 88.6 fL (ref 80.0–100.0)
MONO ABS: 0.4 10*3/uL (ref 0.2–0.9)
Monocytes Relative: 4 %
NEUTROS ABS: 9 10*3/uL — AB (ref 1.4–6.5)
Neutrophils Relative %: 92 %
PLATELETS: 242 10*3/uL (ref 150–440)
RBC: 4.1 MIL/uL (ref 3.80–5.20)
RDW: 15.3 % — AB (ref 11.5–14.5)
WBC: 9.8 10*3/uL (ref 3.6–11.0)

## 2015-06-20 MED ORDER — DEXAMETHASONE SODIUM PHOSPHATE 100 MG/10ML IJ SOLN
10.0000 mg | Freq: Once | INTRAMUSCULAR | Status: AC
  Administered 2015-06-20: 10 mg via INTRAVENOUS
  Filled 2015-06-20: qty 1

## 2015-06-20 MED ORDER — SODIUM CHLORIDE 0.9 % IV SOLN
Freq: Once | INTRAVENOUS | Status: AC
  Administered 2015-06-20: 10:00:00 via INTRAVENOUS
  Filled 2015-06-20: qty 1000

## 2015-06-20 NOTE — Progress Notes (Signed)
Madison Fry  [phone] [phone]) [phone]  ID: Madison Fry OB: 1928/08/04  MR#: [id number]  EVO#:[id number]  Patient Care Team: Madison Neer, MD as PCP - General  CHIEF COMPLAINT:  Chief Complaint  Patient presents with  . Lung Cancer    INTERVAL HISTORY: Patient returns to clinic today for further evaluation and consideration of cycle 3 cisplatin. She is still not feeling well from her treatment and does not want chemotherapy today. She had nausea, vomiting and diarrhea for several days. Her blood sugar dropped in the 50s a couple times last week. Her blood pressure is low today.  Her shortness of breath and occasional wheezing are unchanged. She has no fevers. Her appetite was not good for a few days but is improving. She has no chest pain.    REVIEW OF SYSTEMS:   Review of Systems  Constitutional: Negative.  Negative for fever and malaise/fatigue.  Respiratory: Positive for shortness of breath and wheezing. Negative for cough and hemoptysis.   Cardiovascular: Negative.  Negative for chest pain.  Gastrointestinal: Positive for nausea, vomiting and diarrhea.  Genitourinary: Negative for dysuria and frequency.  Musculoskeletal: Negative.   Neurological: Negative.  Negative for weakness.  Psychiatric/Behavioral: The patient is not nervous/anxious.     As per HPI. Otherwise, a complete review of systems is negatve.  PAST MEDICAL HISTORY: Past Medical History  Diagnosis Date  . Diabetes mellitus without complication ([place])   . Hypertension   . Cancer ([place])     breast cancer, Right side lymph nodes removed    PAST SURGICAL HISTORY: Right colectomy for benign disease, right lumpectomy.  FAMILY HISTORY: Reviewed and unchanged. No reported history of malignancy or chronic disease.     ADVANCED DIRECTIVES:    HEALTH MAINTENANCE: Social History  Substance Use Topics  . Smoking status: Former Smoker -- 1.50 packs/day    Types:  Cigarettes    Quit date: 08/27/1990  . Smokeless tobacco: Never Used  . Alcohol Use: No     Allergies  Allergen Reactions  . Shellfish Allergy Swelling    Current Outpatient Prescriptions  Medication Sig Dispense Refill  . albuterol (PROVENTIL) (2.5 MG/3ML) 0.083% nebulizer solution Take 3 mLs (2.5 mg total) by nebulization 3 (three) times daily. 252 mL 3  . colesevelam (WELCHOL) 625 MG tablet Take by mouth 2 (two) times daily with a meal.    . COMBIGAN 0.2-0.5 % ophthalmic solution PUT 1 DROP IN AFFECTED EYE EVERY 12 HOURS**NEED OFFICE VISIT**  0  . ezetimibe (ZETIA) 10 MG tablet Take 10 mg by mouth daily.    Marland Kitchen glimepiride (AMARYL) 2 MG tablet Take 2 mg by mouth daily with breakfast.    . HYDROcodone-acetaminophen (NORCO/VICODIN) 5-325 MG tablet TAKE 1-2 TABLETS BY MOUTH EVERY 8 HOURS AS NEEDED FOR PAIN  0  . levofloxacin (LEVAQUIN) 500 MG tablet Take 1 tablet (500 mg total) by mouth daily. 5 tablet 0  . levothyroxine (SYNTHROID, LEVOTHROID) 50 MCG tablet Take 50 mcg by mouth daily.    Marland Kitchen LORazepam (ATIVAN) 0.5 MG tablet     . losartan (COZAAR) 100 MG tablet Take 100 mg by mouth daily.    . metFORMIN (GLUCOPHAGE-XR) 500 MG 24 hr tablet TAKE 1 TABLET BY MOUTH WITH MEALS  2  . ondansetron (ZOFRAN) 8 MG tablet Take 1 tablet (8 mg total) by mouth 2 (two) times daily as needed. Start on the third day after chemotherapy. 30 tablet 1  . sucralfate (CARAFATE) 1 g tablet Take 1  tablet (1 g total) by mouth 3 (three) times daily. Dissolve in 2-3 tbsp warm water, swish and swallow. 90 tablet 3  . TRAVATAN Z 0.004 % SOLN ophthalmic solution PUT 1 DROP INTO BOTH EYES AT BEDTIME  0  . verapamil (CALAN-SR) 120 MG CR tablet Take 120 mg by mouth daily.     No current facility-administered medications for this visit.    OBJECTIVE: Filed Vitals:   06/20/15 0909  BP: 90/61  Pulse: 97  Temp: 98.5 F (36.9 C)  Resp: 16     There is no weight on file to calculate BMI.    ECOG FS:0 -  Asymptomatic  General: Well-developed, well-nourished, no acute distress. Eyes: Pink conjunctiva, anicteric sclera. Lungs: Scattered wheezing throughout. Heart: Regular rate and rhythm. No rubs, murmurs, or gallops. Abdomen: Soft, nontender, nondistended. No organomegaly noted, normoactive bowel sounds. Musculoskeletal: No edema, cyanosis, or clubbing. Neuro: Alert, answering all questions appropriately. Cranial nerves grossly intact. Skin: No rashes or petechiae noted. Psych: Normal affect.  LAB RESULTS:  Lab Results  Component Value Date   NA 136 06/20/2015   K 3.4* 06/20/2015   CL 99* 06/20/2015   CO2 30 06/20/2015   GLUCOSE 123* 06/20/2015   BUN 21* 06/20/2015   CREATININE 0.87 06/20/2015   CALCIUM 8.7* 06/20/2015   PROT 7.0 05/02/2015   ALBUMIN 3.5 05/02/2015   AST 19 05/02/2015   ALT 9* 05/02/2015   ALKPHOS 71 05/02/2015   BILITOT 0.4 05/02/2015   GFRNONAA 59* 06/20/2015   GFRAA >60 06/20/2015    Lab Results  Component Value Date   WBC 9.8 06/20/2015   NEUTROABS 9.0* 06/20/2015   HGB 12.4 06/20/2015   HCT 36.4 06/20/2015   MCV 88.6 06/20/2015   PLT 242 06/20/2015     STUDIES: No results found.  ASSESSMENT: Stage Ia adenocarcinoma of the right breast, now with stage IV small cell lung cancer with multiple bony lesions.   PLAN:    1. Small cell lung cancer: CT and PET scan results reviewed independently and reported widespread bony metastasis. MRI the brain did not reveal any intracranial abnormality. Will discontinue Cisplatin since she is finished with radiation and doesn't feel well today. Will discuss treatment options at next visit. Return to clinic in one week for lab work, evaluation and zometa.  2. Breast cancer: Patient completed 5 years of tamoxifen, no further workup or intervention is needed at this time. 3. Osteoporosis: Continue calcium and vitamin D supplementation. Patient will also be receiving Zometa for her bony metastasis. 4.  Hyponatremia: WNL today. Likely secondary to underlying malignancy, monitor. 5. Bony metastasis: Zometa as above. 6. Hypokalemia: Level 3.4 today. Continue potassium. 7. Hypocalcemia: Continue calcium and vitamin D. 8. Nausea/vomiting/diarrhea: Proceed with IVF today.   Patient expressed understanding and was in agreement with this plan. She also understands that She can call clinic at any time with any questions, concerns, or complaints.   Madison Reel, NP   06/20/2015 9:22 AM  Patient was seen and evaluated independently and I agree with the assessment and plan as dictated above.  Lloyd Huger, MD 06/21/2015 4:25 PM

## 2015-06-20 NOTE — Progress Notes (Signed)
Patient is not feeling well today and does not want to get treatment.  This Monday and Tuesday she was having vomiting that was followed by diarrhea on Wednesday.  She is also having difficulty with her glucose dropping in the 50's earlier this week.  Blood pressure is low at 90/61 in office today.

## 2015-06-27 ENCOUNTER — Inpatient Hospital Stay: Payer: Commercial Managed Care - HMO | Attending: Oncology

## 2015-06-27 ENCOUNTER — Inpatient Hospital Stay (HOSPITAL_BASED_OUTPATIENT_CLINIC_OR_DEPARTMENT_OTHER): Payer: Commercial Managed Care - HMO | Admitting: Oncology

## 2015-06-27 ENCOUNTER — Inpatient Hospital Stay: Payer: Commercial Managed Care - HMO

## 2015-06-27 VITALS — BP 140/75 | HR 88 | Temp 97.2°F | Resp 18

## 2015-06-27 VITALS — BP 122/73 | HR 86 | Temp 97.1°F | Resp 17 | Ht 64.5 in | Wt 161.2 lb

## 2015-06-27 DIAGNOSIS — J9811 Atelectasis: Secondary | ICD-10-CM | POA: Diagnosis not present

## 2015-06-27 DIAGNOSIS — Z853 Personal history of malignant neoplasm of breast: Secondary | ICD-10-CM

## 2015-06-27 DIAGNOSIS — C3491 Malignant neoplasm of unspecified part of right bronchus or lung: Secondary | ICD-10-CM

## 2015-06-27 DIAGNOSIS — R0602 Shortness of breath: Secondary | ICD-10-CM | POA: Diagnosis not present

## 2015-06-27 DIAGNOSIS — J90 Pleural effusion, not elsewhere classified: Secondary | ICD-10-CM | POA: Insufficient documentation

## 2015-06-27 DIAGNOSIS — E876 Hypokalemia: Secondary | ICD-10-CM | POA: Insufficient documentation

## 2015-06-27 DIAGNOSIS — N281 Cyst of kidney, acquired: Secondary | ICD-10-CM | POA: Insufficient documentation

## 2015-06-27 DIAGNOSIS — R5383 Other fatigue: Secondary | ICD-10-CM

## 2015-06-27 DIAGNOSIS — Z87891 Personal history of nicotine dependence: Secondary | ICD-10-CM | POA: Diagnosis not present

## 2015-06-27 DIAGNOSIS — I10 Essential (primary) hypertension: Secondary | ICD-10-CM | POA: Insufficient documentation

## 2015-06-27 DIAGNOSIS — R197 Diarrhea, unspecified: Secondary | ICD-10-CM

## 2015-06-27 DIAGNOSIS — E119 Type 2 diabetes mellitus without complications: Secondary | ICD-10-CM | POA: Insufficient documentation

## 2015-06-27 DIAGNOSIS — M818 Other osteoporosis without current pathological fracture: Secondary | ICD-10-CM | POA: Insufficient documentation

## 2015-06-27 DIAGNOSIS — C3490 Malignant neoplasm of unspecified part of unspecified bronchus or lung: Secondary | ICD-10-CM | POA: Insufficient documentation

## 2015-06-27 DIAGNOSIS — E871 Hypo-osmolality and hyponatremia: Secondary | ICD-10-CM | POA: Diagnosis not present

## 2015-06-27 DIAGNOSIS — C7951 Secondary malignant neoplasm of bone: Secondary | ICD-10-CM

## 2015-06-27 DIAGNOSIS — R531 Weakness: Secondary | ICD-10-CM | POA: Insufficient documentation

## 2015-06-27 DIAGNOSIS — Z7984 Long term (current) use of oral hypoglycemic drugs: Secondary | ICD-10-CM | POA: Diagnosis not present

## 2015-06-27 DIAGNOSIS — Z79899 Other long term (current) drug therapy: Secondary | ICD-10-CM | POA: Insufficient documentation

## 2015-06-27 DIAGNOSIS — R112 Nausea with vomiting, unspecified: Secondary | ICD-10-CM | POA: Insufficient documentation

## 2015-06-27 LAB — CBC WITH DIFFERENTIAL/PLATELET
BASOS ABS: 0.1 10*3/uL (ref 0–0.1)
BASOS PCT: 1 %
EOS ABS: 0.1 10*3/uL (ref 0–0.7)
Eosinophils Relative: 2 %
HCT: 34.2 % — ABNORMAL LOW (ref 35.0–47.0)
HEMOGLOBIN: 11.8 g/dL — AB (ref 12.0–16.0)
Lymphocytes Relative: 11 %
Lymphs Abs: 0.6 10*3/uL — ABNORMAL LOW (ref 1.0–3.6)
MCH: 30.6 pg (ref 26.0–34.0)
MCHC: 34.4 g/dL (ref 32.0–36.0)
MCV: 88.9 fL (ref 80.0–100.0)
Monocytes Absolute: 0.3 10*3/uL (ref 0.2–0.9)
Monocytes Relative: 6 %
NEUTROS PCT: 80 %
Neutro Abs: 4.5 10*3/uL (ref 1.4–6.5)
Platelets: 251 10*3/uL (ref 150–440)
RBC: 3.85 MIL/uL (ref 3.80–5.20)
RDW: 16.5 % — ABNORMAL HIGH (ref 11.5–14.5)
WBC: 5.5 10*3/uL (ref 3.6–11.0)

## 2015-06-27 LAB — BASIC METABOLIC PANEL
ANION GAP: 5 (ref 5–15)
BUN: 8 mg/dL (ref 6–20)
CHLORIDE: 103 mmol/L (ref 101–111)
CO2: 28 mmol/L (ref 22–32)
CREATININE: 0.73 mg/dL (ref 0.44–1.00)
Calcium: 8.4 mg/dL — ABNORMAL LOW (ref 8.9–10.3)
GFR calc non Af Amer: >60 mL/min (ref >60)
Glucose, Bld: 105 mg/dL — ABNORMAL HIGH (ref 65–99)
POTASSIUM: 3.5 mmol/L (ref 3.5–5.1)
SODIUM: 136 mmol/L (ref 135–145)

## 2015-06-27 MED ORDER — ZOLEDRONIC ACID 4 MG/5ML IV CONC
3.3000 mg | Freq: Once | INTRAVENOUS | Status: AC
  Administered 2015-06-27: 3.3 mg via INTRAVENOUS
  Filled 2015-06-27: qty 4.13

## 2015-06-27 MED ORDER — SODIUM CHLORIDE 0.9 % IV SOLN
Freq: Once | INTRAVENOUS | Status: AC
  Administered 2015-06-27: 15:00:00 via INTRAVENOUS
  Filled 2015-06-27: qty 1000

## 2015-06-27 NOTE — Progress Notes (Signed)
Pt reports no changes since last visit.  Wants to know about reason for Zometa.  Pt reports feeling better since last visit when she came in dehydrated

## 2015-07-03 NOTE — Progress Notes (Signed)
[name]  [phone] [phone]  ID: Madison Fry OB: [birth year]  MR#: [id number]  AOZ#:[id number]  Patient Care Team: Mayra Neer, MD as PCP - General  CHIEF COMPLAINT:  Chief Complaint  Patient presents with  . Follow-up    Lung Cancer    INTERVAL HISTORY: Patient returns to clinic today for further evaluation and consideration of topotecan. She continues to have increased weakness and fatigue, but admits she is improved over the past week. She has no neurologic complaints. She denies any chest pain or hemoptysis. She denies any further nausea or vomiting. She denies any pain today. Patient offers no further specific complaints today.   REVIEW OF SYSTEMS:   Review of Systems  Constitutional: Negative.  Negative for fever and malaise/fatigue.  Respiratory: Positive for shortness of breath and wheezing. Negative for cough and hemoptysis.   Cardiovascular: Negative.  Negative for chest pain.  Gastrointestinal: Negative for nausea, vomiting and diarrhea.  Genitourinary: Negative for dysuria and frequency.  Musculoskeletal: Negative.   Neurological: Negative.  Negative for weakness.  Psychiatric/Behavioral: The patient is not nervous/anxious.     As per HPI. Otherwise, a complete review of systems is negatve.  PAST MEDICAL HISTORY: Past Medical History  Diagnosis Date  . Diabetes mellitus without complication ([place])   . Hypertension   . Cancer ([place])     breast cancer, Right side lymph nodes removed    PAST SURGICAL HISTORY: Right colectomy for benign disease, right lumpectomy.  FAMILY HISTORY: Reviewed and unchanged. No reported history of malignancy or chronic disease.     ADVANCED DIRECTIVES:    HEALTH MAINTENANCE: Social History  Substance Use Topics  . Smoking status: Former Smoker -- 1.50 packs/day    Types: Cigarettes    Quit date: 08/27/1990  . Smokeless tobacco: Never Used  . Alcohol Use: No      Allergies  Allergen Reactions  . Shellfish Allergy Swelling    Current Outpatient Prescriptions  Medication Sig Dispense Refill  . albuterol (PROVENTIL) (2.5 MG/3ML) 0.083% nebulizer solution Take 3 mLs (2.5 mg total) by nebulization 3 (three) times daily. 252 mL 3  . colesevelam (WELCHOL) 625 MG tablet Take by mouth 2 (two) times daily with a meal.    . COMBIGAN 0.2-0.5 % ophthalmic solution PUT 1 DROP IN AFFECTED EYE EVERY 12 HOURS**NEED OFFICE VISIT**  0  . ezetimibe (ZETIA) 10 MG tablet Take 10 mg by mouth daily.    Marland Kitchen glimepiride (AMARYL) 2 MG tablet Take 2 mg by mouth daily with breakfast.    . HYDROcodone-acetaminophen (NORCO/VICODIN) 5-325 MG tablet TAKE 1-2 TABLETS BY MOUTH EVERY 8 HOURS AS NEEDED FOR PAIN  0  . levofloxacin (LEVAQUIN) 500 MG tablet Take 1 tablet (500 mg total) by mouth daily. 5 tablet 0  . levothyroxine (SYNTHROID, LEVOTHROID) 50 MCG tablet Take 50 mcg by mouth daily.    Marland Kitchen LORazepam (ATIVAN) 0.5 MG tablet     . losartan (COZAAR) 100 MG tablet Take 100 mg by mouth daily.    . metFORMIN (GLUCOPHAGE-XR) 500 MG 24 hr tablet TAKE 1 TABLET BY MOUTH WITH MEALS  2  . ondansetron (ZOFRAN) 8 MG tablet Take 1 tablet (8 mg total) by mouth 2 (two) times daily as needed. Start on the third day after chemotherapy. 30 tablet 1  . sucralfate (CARAFATE) 1 g tablet Take 1 tablet (1 g total) by mouth 3 (three) times daily. Dissolve in 2-3 tbsp warm water, swish and swallow. 90 tablet 3  .  TRAVATAN Z 0.004 % SOLN ophthalmic solution PUT 1 DROP INTO BOTH EYES AT BEDTIME  0  . verapamil (CALAN-SR) 120 MG CR tablet Take 120 mg by mouth daily.     No current facility-administered medications for this visit.    OBJECTIVE: Filed Vitals:   06/27/15 1421  BP: 122/73  Pulse: 86  Temp: 97.1 F (36.2 C)  Resp: 17     Body mass index is 27.25 kg/(m^2).    ECOG FS:0 - Asymptomatic  General: Well-developed, well-nourished, no acute distress. Eyes: Pink conjunctiva, anicteric  sclera. Lungs: Scattered wheezing throughout. Heart: Regular rate and rhythm. No rubs, murmurs, or gallops. Abdomen: Soft, nontender, nondistended. No organomegaly noted, normoactive bowel sounds. Musculoskeletal: No edema, cyanosis, or clubbing. Neuro: Alert, answering all questions appropriately. Cranial nerves grossly intact. Skin: No rashes or petechiae noted. Psych: Normal affect.  LAB RESULTS:  Lab Results  Component Value Date   NA 136 06/27/2015   K 3.5 06/27/2015   CL 103 06/27/2015   CO2 28 06/27/2015   GLUCOSE 105* 06/27/2015   BUN 8 06/27/2015   CREATININE 0.73 06/27/2015   CALCIUM 8.4* 06/27/2015   PROT 7.0 05/02/2015   ALBUMIN 3.5 05/02/2015   AST 19 05/02/2015   ALT 9* 05/02/2015   ALKPHOS 71 05/02/2015   BILITOT 0.4 05/02/2015   GFRNONAA >60 06/27/2015   GFRAA >60 06/27/2015    Lab Results  Component Value Date   WBC 5.5 06/27/2015   NEUTROABS 4.5 06/27/2015   HGB 11.8* 06/27/2015   HCT 34.2* 06/27/2015   MCV 88.9 06/27/2015   PLT 251 06/27/2015     STUDIES: No results found.  ASSESSMENT: Stage Ia adenocarcinoma of the right breast, now with stage IV small cell lung cancer with multiple bony lesions.   PLAN:    1. Small cell lung cancer: CT and PET scan results reviewed independently and reported widespread bony metastasis. MRI the brain did not reveal any intracranial abnormality. Cisplatin has been discontinued and patient is interested in pursuing palliative chemotherapy with topotecan. She will receive Zometa today. Return to clinic in 1 week for consideration of cycle 1, day 1 of single agent topotecan. Will reimage with CT scan prior to next clinic visit. 2. Breast cancer: Patient completed 5 years of tamoxifen, no further workup or intervention is needed at this time. 3. Osteoporosis: Continue calcium and vitamin D supplementation. Patient will also be receiving Zometa for her bony metastasis. 4. Hyponatremia: WNL today. Likely secondary to  underlying malignancy, monitor. 5. Bony metastasis: Zometa as above. 6. Hypokalemia: Level 3.5 today. Continue potassium. 7. Hypocalcemia: Continue calcium and vitamin D. 8. Nausea/vomiting/diarrhea: Improved, monitor.   Patient expressed understanding and was in agreement with this plan. She also understands that She can call clinic at any time with any questions, concerns, or complaints.   Lloyd Huger, MD   07/03/2015 10:41 PM  Patient was seen and evaluated independently and I agree with the assessment and plan as dictated above.  Lloyd Huger, MD 07/03/2015 10:41 PM

## 2015-07-04 ENCOUNTER — Ambulatory Visit: Payer: Commercial Managed Care - HMO | Admitting: Oncology

## 2015-07-04 ENCOUNTER — Other Ambulatory Visit: Payer: Commercial Managed Care - HMO

## 2015-07-04 ENCOUNTER — Ambulatory Visit: Payer: Commercial Managed Care - HMO

## 2015-07-05 ENCOUNTER — Ambulatory Visit: Payer: Commercial Managed Care - HMO

## 2015-07-08 ENCOUNTER — Inpatient Hospital Stay: Payer: Commercial Managed Care - HMO

## 2015-07-08 ENCOUNTER — Telehealth: Payer: Self-pay | Admitting: *Deleted

## 2015-07-08 DIAGNOSIS — R309 Painful micturition, unspecified: Secondary | ICD-10-CM

## 2015-07-08 DIAGNOSIS — R350 Frequency of micturition: Secondary | ICD-10-CM

## 2015-07-08 DIAGNOSIS — N390 Urinary tract infection, site not specified: Secondary | ICD-10-CM

## 2015-07-08 DIAGNOSIS — C3490 Malignant neoplasm of unspecified part of unspecified bronchus or lung: Secondary | ICD-10-CM | POA: Diagnosis not present

## 2015-07-08 LAB — URINALYSIS COMPLETE WITH MICROSCOPIC (ARMC ONLY)
BILIRUBIN URINE: NEGATIVE
GLUCOSE, UA: NEGATIVE mg/dL
Hgb urine dipstick: NEGATIVE
Ketones, ur: NEGATIVE mg/dL
Nitrite: NEGATIVE
PH: 5 (ref 5.0–8.0)
Protein, ur: 30 mg/dL — AB
Specific Gravity, Urine: 1.021 (ref 1.005–1.030)

## 2015-07-08 MED ORDER — LEVOFLOXACIN 500 MG PO TABS: 500.0000 mg | ORAL_TABLET | Freq: Every day | ORAL | Status: DC

## 2015-07-08 NOTE — Telephone Encounter (Signed)
Please have patient come in to clinic for UA and urine culture. When she does that, we can call in Levaquin 500 mg daily for 7 days.

## 2015-07-08 NOTE — Telephone Encounter (Signed)
I spoke with Ms Madison Fry and she has agreed to bring her mother in at [phone]

## 2015-07-08 NOTE — Telephone Encounter (Signed)
Called to report that her mother has a bladder infection again, burning when urinates, foul smelling and having to go frequently. Asking that we send a prescription in for her to [facility] on [facility] [address] [facility] [facility]

## 2015-07-10 LAB — URINE CULTURE

## 2015-07-11 ENCOUNTER — Ambulatory Visit: Payer: Commercial Managed Care - HMO | Admitting: Oncology

## 2015-07-11 ENCOUNTER — Other Ambulatory Visit: Payer: Commercial Managed Care - HMO

## 2015-07-11 ENCOUNTER — Ambulatory Visit: Payer: Commercial Managed Care - HMO

## 2015-07-12 ENCOUNTER — Ambulatory Visit
Admission: RE | Admit: 2015-07-12 | Discharge: 2015-07-12 | Disposition: A | Discharge status: Home / Self Care | Payer: Commercial Managed Care - HMO | Source: Ambulatory Visit | Attending: Oncology | Admitting: Oncology

## 2015-07-12 DIAGNOSIS — M129 Arthropathy, unspecified: Secondary | ICD-10-CM | POA: Diagnosis not present

## 2015-07-12 DIAGNOSIS — I7090 Unspecified atherosclerosis: Secondary | ICD-10-CM | POA: Diagnosis not present

## 2015-07-12 DIAGNOSIS — J90 Pleural effusion, not elsewhere classified: Secondary | ICD-10-CM | POA: Diagnosis not present

## 2015-07-12 DIAGNOSIS — C3491 Malignant neoplasm of unspecified part of right bronchus or lung: Secondary | ICD-10-CM | POA: Insufficient documentation

## 2015-07-12 MED ORDER — IOPAMIDOL (ISOVUE-300) INJECTION 61%
100.0000 mL | Freq: Once | INTRAVENOUS | Status: AC | PRN
  Administered 2015-07-12: 100 mL via INTRAVENOUS

## 2015-07-18 ENCOUNTER — Ambulatory Visit
Admission: RE | Admit: 2015-07-18 | Discharge: 2015-07-18 | Disposition: A | Discharge status: Home / Self Care | Payer: Commercial Managed Care - HMO | Source: Ambulatory Visit | Attending: Radiation Oncology | Admitting: Radiation Oncology

## 2015-07-18 ENCOUNTER — Inpatient Hospital Stay: Payer: Commercial Managed Care - HMO

## 2015-07-18 ENCOUNTER — Inpatient Hospital Stay (HOSPITAL_BASED_OUTPATIENT_CLINIC_OR_DEPARTMENT_OTHER): Payer: Commercial Managed Care - HMO | Admitting: Oncology

## 2015-07-18 ENCOUNTER — Encounter: Payer: Self-pay | Admitting: Radiation Oncology

## 2015-07-18 VITALS — BP 159/71 | HR 96 | Temp 97.7°F | Resp 18 | Wt 162.4 lb

## 2015-07-18 DIAGNOSIS — C3411 Malignant neoplasm of upper lobe, right bronchus or lung: Secondary | ICD-10-CM | POA: Insufficient documentation

## 2015-07-18 DIAGNOSIS — E876 Hypokalemia: Secondary | ICD-10-CM

## 2015-07-18 DIAGNOSIS — C3491 Malignant neoplasm of unspecified part of right bronchus or lung: Secondary | ICD-10-CM

## 2015-07-18 DIAGNOSIS — C3490 Malignant neoplasm of unspecified part of unspecified bronchus or lung: Secondary | ICD-10-CM

## 2015-07-18 DIAGNOSIS — Z87891 Personal history of nicotine dependence: Secondary | ICD-10-CM

## 2015-07-18 DIAGNOSIS — N281 Cyst of kidney, acquired: Secondary | ICD-10-CM

## 2015-07-18 DIAGNOSIS — C778 Secondary and unspecified malignant neoplasm of lymph nodes of multiple regions: Secondary | ICD-10-CM | POA: Diagnosis not present

## 2015-07-18 DIAGNOSIS — J9811 Atelectasis: Secondary | ICD-10-CM | POA: Diagnosis not present

## 2015-07-18 DIAGNOSIS — J90 Pleural effusion, not elsewhere classified: Secondary | ICD-10-CM

## 2015-07-18 DIAGNOSIS — Z7984 Long term (current) use of oral hypoglycemic drugs: Secondary | ICD-10-CM

## 2015-07-18 DIAGNOSIS — R112 Nausea with vomiting, unspecified: Secondary | ICD-10-CM

## 2015-07-18 DIAGNOSIS — C7951 Secondary malignant neoplasm of bone: Secondary | ICD-10-CM

## 2015-07-18 DIAGNOSIS — R5383 Other fatigue: Secondary | ICD-10-CM

## 2015-07-18 DIAGNOSIS — E119 Type 2 diabetes mellitus without complications: Secondary | ICD-10-CM

## 2015-07-18 DIAGNOSIS — E871 Hypo-osmolality and hyponatremia: Secondary | ICD-10-CM

## 2015-07-18 DIAGNOSIS — Z853 Personal history of malignant neoplasm of breast: Secondary | ICD-10-CM

## 2015-07-18 DIAGNOSIS — R0602 Shortness of breath: Secondary | ICD-10-CM

## 2015-07-18 DIAGNOSIS — R531 Weakness: Secondary | ICD-10-CM

## 2015-07-18 DIAGNOSIS — M818 Other osteoporosis without current pathological fracture: Secondary | ICD-10-CM

## 2015-07-18 DIAGNOSIS — I10 Essential (primary) hypertension: Secondary | ICD-10-CM

## 2015-07-18 DIAGNOSIS — Z79899 Other long term (current) drug therapy: Secondary | ICD-10-CM

## 2015-07-18 DIAGNOSIS — R197 Diarrhea, unspecified: Secondary | ICD-10-CM

## 2015-07-18 LAB — CBC WITH DIFFERENTIAL/PLATELET
Basophils Absolute: 0 10*3/uL (ref 0–0.1)
Basophils Relative: 0 %
EOS PCT: 0 %
Eosinophils Absolute: 0 10*3/uL (ref 0–0.7)
HCT: 34.1 % — ABNORMAL LOW (ref 35.0–47.0)
Hemoglobin: 11.9 g/dL — ABNORMAL LOW (ref 12.0–16.0)
LYMPHS ABS: 0.7 10*3/uL — AB (ref 1.0–3.6)
LYMPHS PCT: 10 %
MCH: 31.2 pg (ref 26.0–34.0)
MCHC: 34.9 g/dL (ref 32.0–36.0)
MCV: 89.6 fL (ref 80.0–100.0)
Monocytes Absolute: 0.6 10*3/uL (ref 0.2–0.9)
Monocytes Relative: 8 %
Neutro Abs: 5.8 10*3/uL (ref 1.4–6.5)
Neutrophils Relative %: 82 %
PLATELETS: 288 10*3/uL (ref 150–440)
RBC: 3.8 MIL/uL (ref 3.80–5.20)
RDW: 18.1 % — ABNORMAL HIGH (ref 11.5–14.5)
WBC: 7.2 10*3/uL (ref 3.6–11.0)

## 2015-07-18 LAB — BASIC METABOLIC PANEL
Anion gap: 5 (ref 5–15)
BUN: 9 mg/dL (ref 6–20)
CALCIUM: 7.7 mg/dL — AB (ref 8.9–10.3)
CO2: 23 mmol/L (ref 22–32)
Chloride: 95 mmol/L — ABNORMAL LOW (ref 101–111)
Creatinine, Ser: 0.57 mg/dL (ref 0.44–1.00)
GFR calc Af Amer: >60 mL/min (ref >60)
GLUCOSE: 108 mg/dL — AB (ref 65–99)
POTASSIUM: 3.7 mmol/L (ref 3.5–5.1)
Sodium: 123 mmol/L — ABNORMAL LOW (ref 135–145)

## 2015-07-18 MED ORDER — SODIUM CHLORIDE 0.9 % IV SOLN
Freq: Once | INTRAVENOUS | Status: AC
  Administered 2015-07-18: 11:00:00 via INTRAVENOUS
  Filled 2015-07-18: qty 1000

## 2015-07-18 MED ORDER — TOPOTECAN HCL CHEMO INJECTION 4 MG
4.0000 mg/m2 | Freq: Once | INTRAVENOUS | Status: AC
  Administered 2015-07-18: 7.3 mg via INTRAVENOUS
  Filled 2015-07-18: qty 7.3

## 2015-07-18 MED ORDER — PROCHLORPERAZINE MALEATE 10 MG PO TABS
10.0000 mg | ORAL_TABLET | Freq: Once | ORAL | Status: AC
  Administered 2015-07-18: 10 mg via ORAL
  Filled 2015-07-18: qty 1

## 2015-07-18 MED ORDER — ZOLPIDEM TARTRATE 5 MG PO TABS: 5.0000 mg | ORAL_TABLET | Freq: Every evening | ORAL | Status: DC | PRN

## 2015-07-18 NOTE — Progress Notes (Signed)
Radiation Oncology Follow up Note  Name: Madison Fry   Date:   07/18/2015  MRN:  [id number] DOB: Jan 20, 1929    This 82 y.o. female presents to the clinic today for one-month follow-up for palliative radiation therapy to her lung for stage IV small cell lung cancer.  REFERRING PROVIDER: Mayra Neer, MD  HPI: Patient is a 82 year old female now 1 month out having received palliative radiation therapy to her chest for stage IV small cell lung cancer. She is noted to have widespread osseous metastasis from her small cell lung cancer. She is also currently on palliative chemotherapy under medical oncology's direction. She is currently on Toprol T can as well as medical for her bone metastasis. She is doing well. She specifically denies cough hemoptysis chest tightness or any dysphagia. She recently had a CT scan of her chest abdomen and pelvis showing significant response to therapy with reexpansion of the right upper lobe and near complete resolution of right supraclavicular right hilar and mediastinal adenopathy. She still has evidence of multiple osseous metastasis.  COMPLICATIONS OF TREATMENT: none  FOLLOW UP COMPLIANCE: keeps appointments   PHYSICAL EXAM:  There were no vitals taken for this visit. Well-developed female wheelchair-bound in NAD. Well-developed well-nourished patient in NAD. HEENT reveals PERLA, EOMI, discs not visualized.  Oral cavity is clear. No oral mucosal lesions are identified. Neck is clear without evidence of cervical or supraclavicular adenopathy. Lungs are clear to A&P. Cardiac examination is essentially unremarkable with regular rate and rhythm without murmur rub or thrill. Abdomen is benign with no organomegaly or masses noted. Motor sensory and DTR levels are equal and symmetric in the upper and lower extremities. Cranial nerves II through XII are grossly intact. Proprioception is intact. No peripheral adenopathy or edema is identified. No motor or sensory  levels are noted. Crude visual fields are within normal range.  RADIOLOGY RESULTS: CT scans chest abdomen and pelvis are all reviewed and compatible above-stated findings  PLAN: At this time patient is doing well. She is an ex-response to both chemotherapy and palliative radiation therapy to her chest. Question is should we go ahead with prophylactic treatment to her whole brain and I believe we can hold that treatment this time while she is on palliative chemotherapy. She is quite frail and I would reserve radiation therapy to her brain should she develop metastasis in the near future I would be happy to reevaluate her. Otherwise I've asked to see her back in 3 months for follow-up. She continues close follow-up care with medical oncology.  I would like to take this opportunity to thank you for allowing me to participate in the care of your patient.Armstead Peaks., MD

## 2015-07-25 ENCOUNTER — Inpatient Hospital Stay: Payer: Commercial Managed Care - HMO | Admitting: Oncology

## 2015-07-25 ENCOUNTER — Inpatient Hospital Stay: Payer: Commercial Managed Care - HMO

## 2015-07-25 VITALS — BP 130/65 | HR 93 | Temp 99.4°F | Wt 159.6 lb

## 2015-07-25 DIAGNOSIS — C7951 Secondary malignant neoplasm of bone: Secondary | ICD-10-CM

## 2015-07-25 DIAGNOSIS — C3491 Malignant neoplasm of unspecified part of right bronchus or lung: Secondary | ICD-10-CM

## 2015-07-25 DIAGNOSIS — C3490 Malignant neoplasm of unspecified part of unspecified bronchus or lung: Secondary | ICD-10-CM | POA: Diagnosis not present

## 2015-07-25 LAB — CBC WITH DIFFERENTIAL/PLATELET
BASOS ABS: 0 10*3/uL (ref 0–0.1)
BASOS PCT: 1 %
EOS ABS: 0.1 10*3/uL (ref 0–0.7)
Eosinophils Relative: 2 %
HEMATOCRIT: 31.7 % — AB (ref 35.0–47.0)
HEMOGLOBIN: 10.9 g/dL — AB (ref 12.0–16.0)
Lymphocytes Relative: 19 %
Lymphs Abs: 0.9 10*3/uL — ABNORMAL LOW (ref 1.0–3.6)
MCH: 31.1 pg (ref 26.0–34.0)
MCHC: 34.3 g/dL (ref 32.0–36.0)
MCV: 90.7 fL (ref 80.0–100.0)
Monocytes Absolute: 0.2 10*3/uL (ref 0.2–0.9)
Monocytes Relative: 5 %
NEUTROS ABS: 3.3 10*3/uL (ref 1.4–6.5)
NEUTROS PCT: 73 %
Platelets: 187 10*3/uL (ref 150–440)
RBC: 3.5 MIL/uL — AB (ref 3.80–5.20)
RDW: 17.6 % — ABNORMAL HIGH (ref 11.5–14.5)
WBC: 4.6 10*3/uL (ref 3.6–11.0)

## 2015-07-25 LAB — BASIC METABOLIC PANEL
ANION GAP: 8 (ref 5–15)
BUN: 9 mg/dL (ref 6–20)
CHLORIDE: 102 mmol/L (ref 101–111)
CO2: 25 mmol/L (ref 22–32)
CREATININE: 0.6 mg/dL (ref 0.44–1.00)
Calcium: 8.4 mg/dL — ABNORMAL LOW (ref 8.9–10.3)
GFR calc non Af Amer: >60 mL/min (ref >60)
Glucose, Bld: 102 mg/dL — ABNORMAL HIGH (ref 65–99)
POTASSIUM: 3 mmol/L — AB (ref 3.5–5.1)
SODIUM: 135 mmol/L (ref 135–145)

## 2015-07-25 MED ORDER — TOPOTECAN HCL CHEMO INJECTION 4 MG
4.0000 mg/m2 | Freq: Once | INTRAVENOUS | Status: AC
  Administered 2015-07-25: 7.3 mg via INTRAVENOUS
  Filled 2015-07-25: qty 7.3

## 2015-07-25 MED ORDER — PROCHLORPERAZINE MALEATE 10 MG PO TABS
10.0000 mg | ORAL_TABLET | Freq: Once | ORAL | Status: AC
  Administered 2015-07-25: 10 mg via ORAL
  Filled 2015-07-25: qty 1

## 2015-07-25 MED ORDER — SODIUM CHLORIDE 0.9 % IV SOLN
Freq: Once | INTRAVENOUS | Status: AC
  Administered 2015-07-25: 15:00:00 via INTRAVENOUS
  Filled 2015-07-25: qty 1000

## 2015-07-27 NOTE — Progress Notes (Signed)
[place]  [phone] Fax:(336) [phone]  ID: Madison Fry OB: 05-30-1928  MR#: [id number]  ERD#:[id number]  Patient Care Team: Mayra Neer, MD as PCP - General  CHIEF COMPLAINT:  Chief Complaint  Patient presents with  . small cell lung cancer    INTERVAL HISTORY: Patient returns to clinic today for further evaluation and to initiate cycle 1, day 1 of single agent topotecan. Her weakness and fatigue is unchanged. She denies any fevers. She has no neurologic complaints. She denies any chest pain, shortness of breath, cough or hemoptysis. She denies any further nausea or vomiting. She denies any pain today. Patient offers no further specific complaints today.   REVIEW OF SYSTEMS:   Review of Systems  Constitutional: Negative.  Negative for fever and malaise/fatigue.  Respiratory: Positive for shortness of breath and wheezing. Negative for cough and hemoptysis.   Cardiovascular: Negative.  Negative for chest pain.  Gastrointestinal: Negative for nausea, vomiting and diarrhea.  Genitourinary: Negative for dysuria and frequency.  Musculoskeletal: Negative.   Neurological: Negative.  Negative for weakness.  Psychiatric/Behavioral: The patient is not nervous/anxious.     As per HPI. Otherwise, a complete review of systems is negatve.  PAST MEDICAL HISTORY: Past Medical History  Diagnosis Date  . Diabetes mellitus without complication ([place])   . Hypertension   . Cancer ([place])     breast cancer, Right side lymph nodes removed    PAST SURGICAL HISTORY: Right colectomy for benign disease, right lumpectomy.  FAMILY HISTORY: Reviewed and unchanged. No reported history of malignancy or chronic disease.     ADVANCED DIRECTIVES:    HEALTH MAINTENANCE: Social History  Substance Use Topics  . Smoking status: Former Smoker -- 1.50 packs/day    Types: Cigarettes    Quit date: 08/27/1990  . Smokeless tobacco: Never Used  . Alcohol Use: No      Allergies  Allergen Reactions  . Shellfish Allergy Swelling    Current Outpatient Prescriptions  Medication Sig Dispense Refill  . albuterol (PROVENTIL) (2.5 MG/3ML) 0.083% nebulizer solution Take 3 mLs (2.5 mg total) by nebulization 3 (three) times daily. 252 mL 3  . colesevelam (WELCHOL) 625 MG tablet Take by mouth 2 (two) times daily with a meal.    . COMBIGAN 0.2-0.5 % ophthalmic solution PUT 1 DROP IN AFFECTED EYE EVERY 12 HOURS**NEED OFFICE VISIT**  0  . ezetimibe (ZETIA) 10 MG tablet Take 10 mg by mouth daily.    Marland Kitchen glimepiride (AMARYL) 2 MG tablet Take 2 mg by mouth daily with breakfast.    . HYDROcodone-acetaminophen (NORCO/VICODIN) 5-325 MG tablet TAKE 1-2 TABLETS BY MOUTH EVERY 8 HOURS AS NEEDED FOR PAIN  0  . levothyroxine (SYNTHROID, LEVOTHROID) 50 MCG tablet Take 50 mcg by mouth daily.    Marland Kitchen LORazepam (ATIVAN) 0.5 MG tablet     . losartan (COZAAR) 100 MG tablet Take 100 mg by mouth daily.    . metFORMIN (GLUCOPHAGE-XR) 500 MG 24 hr tablet TAKE 1 TABLET BY MOUTH WITH MEALS  2  . TRAVATAN Z 0.004 % SOLN ophthalmic solution PUT 1 DROP INTO BOTH EYES AT BEDTIME  0  . verapamil (CALAN-SR) 120 MG CR tablet Take 120 mg by mouth daily.    Marland Kitchen zolpidem (AMBIEN) 5 MG tablet Take 1 tablet (5 mg total) by mouth at bedtime as needed for sleep. 30 tablet 0   No current facility-administered medications for this visit.    OBJECTIVE: Filed Vitals:   07/18/15 1014  BP: 159/71  Pulse: 96  Temp: 97.7 F (36.5 C)  Resp: 18     Body mass index is 27.45 kg/(m^2).    ECOG FS:1 - Symptomatic but completely ambulatory  General: Well-developed, well-nourished, no acute distress. Eyes: Pink conjunctiva, anicteric sclera. Lungs: Scattered wheezing throughout. Heart: Regular rate and rhythm. No rubs, murmurs, or gallops. Abdomen: Soft, nontender, nondistended. No organomegaly noted, normoactive bowel sounds. Musculoskeletal: No edema, cyanosis, or clubbing. Neuro: Alert, answering all  questions appropriately. Cranial nerves grossly intact. Skin: No rashes or petechiae noted. Psych: Normal affect.  LAB RESULTS:  Lab Results  Component Value Date   NA 135 07/25/2015   K 3.0* 07/25/2015   CL 102 07/25/2015   CO2 25 07/25/2015   GLUCOSE 102* 07/25/2015   BUN 9 07/25/2015   CREATININE 0.60 07/25/2015   CALCIUM 8.4* 07/25/2015   PROT 7.0 05/02/2015   ALBUMIN 3.5 05/02/2015   AST 19 05/02/2015   ALT 9* 05/02/2015   ALKPHOS 71 05/02/2015   BILITOT 0.4 05/02/2015   GFRNONAA >60 07/25/2015   GFRAA >60 07/25/2015    Lab Results  Component Value Date   WBC 4.6 07/25/2015   NEUTROABS 3.3 07/25/2015   HGB 10.9* 07/25/2015   HCT 31.7* 07/25/2015   MCV 90.7 07/25/2015   PLT 187 07/25/2015     STUDIES: Ct Chest W Contrast  07/12/2015  CLINICAL DATA:  Restaging small cell lung cancer. Chemotherapy and radiation therapy completed. Patient reports weight loss. EXAM: CT CHEST, ABDOMEN, AND PELVIS WITH CONTRAST TECHNIQUE: Multidetector CT imaging of the chest, abdomen and pelvis was performed following the standard protocol during bolus administration of intravenous contrast. CONTRAST:  134m ISOVUE-300 IOPAMIDOL (ISOVUE-300) INJECTION 61% COMPARISON:  Chest CT 04/10/2015.  PET-CT 04/24/2015 FINDINGS: CT CHEST Cardiovascular: The heart size is normal. There is no pericardial effusion. There is diffuse atherosclerosis of the aorta, great vessels and coronary arteries. No acute vascular findings are seen. Mediastinum/Nodes: Interval near-complete resolution of previously demonstrated extensive right supraclavicular, mediastinal and right hilar adenopathy. There is mild residual ill-defined soft tissue around the right hilum, extending into the subcarinal region. There is no discrete residual adenopathy. There is no axillary adenopathy. The thyroid gland, trachea and esophagus demonstrate no significant findings. Lungs/Pleura: A moderate-sized dependent right pleural effusion is  similar in volume to the PET-CT. This measures near water density and demonstrates no associated nodularity. There is no significant pleural fluid on the left. There has been interval partial re-expansion of the right upper lobe. There is residual volume loss and suprahilar pulmonary opacity without well-defined mass. There is improved attenuation of the right-sided bronchi without focal endobronchial lesion. There is mild dependent atelectasis in the right lower lobe. There is also patchy atelectasis in the left lung. No new or enlarging pulmonary nodules identified. Musculoskeletal/Chest wall: There are multiple ill-defined sclerotic lesions in the spine, likely representing treated metastatic disease. No lytic lesion or pathologic fracture identified. CT ABDOMEN AND PELVIS FINDINGS Hepatobiliary: The liver is normal in density without focal abnormality. No evidence of gallstones, gallbladder wall thickening or biliary dilatation. Pancreas: Unremarkable. No pancreatic ductal dilatation or surrounding inflammatory changes. Spleen: Normal in size without focal abnormality. Adrenals/Urinary Tract: There is stable mild prominence of the left adrenal gland without focal nodule. The right adrenal gland appears normal. There is a stable simple cyst in the upper pole the left kidney. The right kidney appears normal. No evidence of urinary tract calculus or hydronephrosis. The bladder appears unremarkable. Stomach/Bowel: No evidence of bowel wall thickening, distention  or surrounding inflammatory change. There are postsurgical changes from previous right hemicolectomy. Vascular/Lymphatic: There are no enlarged abdominal or pelvic lymph nodes. Diffuse aortic and branch vessel atherosclerosis. Reproductive: The uterus and ovaries appear stable. There is a fatty lesion in the left adnexal with peripheral calcifications measuring 2.6 cm on image 104, unchanged. This may reflect a chronic dermoid. Other: Stable postsurgical  changes in the omentum with a tubular area of fat measuring up to 4.0 cm transverse on image 93 and up to 8 cm in length on coronal image 42. No ascites or peritoneal nodularity. Musculoskeletal: There are numerous ill-defined sclerotic lesions throughout the lumbar spine, pelvis and proximal femurs consistent with treated metastatic disease. There is severe osteoarthritis of both hips with bilateral hip joint effusions, worse on the right. There is posttraumatic deformity of the pubic rami on the left. IMPRESSION: 1. Significant response to therapy in the thoracic malignancy. There is partial re-expansion of the right upper lobe with near-complete resolution of the previously demonstrated right supraclavicular, right hilar and mediastinal lymphadenopathy. 2. Persistent moderate size dependent right pleural effusion without malignant features. Patchy atelectasis in both lungs. 3. Multiple sclerotic osseous lesions consistent with treated metastases. No lytic lesion or pathologic fracture identified. 4. No evidence of extra osseous metastatic disease in the abdomen or pelvis. 5. Atherosclerosis, postsurgical changes in the abdomen, a peripherally calcified fatty left adnexal lesion and severe hip arthropathy noted bilaterally. Electronically Signed   By: Richardean Sale M.D.   On: 07/12/2015 14:43   Ct Abdomen Pelvis W Contrast  07/12/2015  CLINICAL DATA:  Restaging small cell lung cancer. Chemotherapy and radiation therapy completed. Patient reports weight loss. EXAM: CT CHEST, ABDOMEN, AND PELVIS WITH CONTRAST TECHNIQUE: Multidetector CT imaging of the chest, abdomen and pelvis was performed following the standard protocol during bolus administration of intravenous contrast. CONTRAST:  170m ISOVUE-300 IOPAMIDOL (ISOVUE-300) INJECTION 61% COMPARISON:  Chest CT 04/10/2015.  PET-CT 04/24/2015 FINDINGS: CT CHEST Cardiovascular: The heart size is normal. There is no pericardial effusion. There is diffuse  atherosclerosis of the aorta, great vessels and coronary arteries. No acute vascular findings are seen. Mediastinum/Nodes: Interval near-complete resolution of previously demonstrated extensive right supraclavicular, mediastinal and right hilar adenopathy. There is mild residual ill-defined soft tissue around the right hilum, extending into the subcarinal region. There is no discrete residual adenopathy. There is no axillary adenopathy. The thyroid gland, trachea and esophagus demonstrate no significant findings. Lungs/Pleura: A moderate-sized dependent right pleural effusion is similar in volume to the PET-CT. This measures near water density and demonstrates no associated nodularity. There is no significant pleural fluid on the left. There has been interval partial re-expansion of the right upper lobe. There is residual volume loss and suprahilar pulmonary opacity without well-defined mass. There is improved attenuation of the right-sided bronchi without focal endobronchial lesion. There is mild dependent atelectasis in the right lower lobe. There is also patchy atelectasis in the left lung. No new or enlarging pulmonary nodules identified. Musculoskeletal/Chest wall: There are multiple ill-defined sclerotic lesions in the spine, likely representing treated metastatic disease. No lytic lesion or pathologic fracture identified. CT ABDOMEN AND PELVIS FINDINGS Hepatobiliary: The liver is normal in density without focal abnormality. No evidence of gallstones, gallbladder wall thickening or biliary dilatation. Pancreas: Unremarkable. No pancreatic ductal dilatation or surrounding inflammatory changes. Spleen: Normal in size without focal abnormality. Adrenals/Urinary Tract: There is stable mild prominence of the left adrenal gland without focal nodule. The right adrenal gland appears normal. There is a stable  simple cyst in the upper pole the left kidney. The right kidney appears normal. No evidence of urinary tract  calculus or hydronephrosis. The bladder appears unremarkable. Stomach/Bowel: No evidence of bowel wall thickening, distention or surrounding inflammatory change. There are postsurgical changes from previous right hemicolectomy. Vascular/Lymphatic: There are no enlarged abdominal or pelvic lymph nodes. Diffuse aortic and branch vessel atherosclerosis. Reproductive: The uterus and ovaries appear stable. There is a fatty lesion in the left adnexal with peripheral calcifications measuring 2.6 cm on image 104, unchanged. This may reflect a chronic dermoid. Other: Stable postsurgical changes in the omentum with a tubular area of fat measuring up to 4.0 cm transverse on image 93 and up to 8 cm in length on coronal image 42. No ascites or peritoneal nodularity. Musculoskeletal: There are numerous ill-defined sclerotic lesions throughout the lumbar spine, pelvis and proximal femurs consistent with treated metastatic disease. There is severe osteoarthritis of both hips with bilateral hip joint effusions, worse on the right. There is posttraumatic deformity of the pubic rami on the left. IMPRESSION: 1. Significant response to therapy in the thoracic malignancy. There is partial re-expansion of the right upper lobe with near-complete resolution of the previously demonstrated right supraclavicular, right hilar and mediastinal lymphadenopathy. 2. Persistent moderate size dependent right pleural effusion without malignant features. Patchy atelectasis in both lungs. 3. Multiple sclerotic osseous lesions consistent with treated metastases. No lytic lesion or pathologic fracture identified. 4. No evidence of extra osseous metastatic disease in the abdomen or pelvis. 5. Atherosclerosis, postsurgical changes in the abdomen, a peripherally calcified fatty left adnexal lesion and severe hip arthropathy noted bilaterally. Electronically Signed   By: Richardean Sale M.D.   On: 07/12/2015 14:43    ASSESSMENT: Stage Ia adenocarcinoma of  the right breast, now with stage IV small cell lung cancer with multiple bony lesions.   PLAN:    1. Small cell lung cancer, stage IV with metastasis to the bone: Restaging CT scan reviewed independently and reported as above with significant response to XRT with her thoracic mass. Patient continues to have persistent metastatic disease. MRI the brain did not reveal any intracranial abnormality. Proceed with cycle 1, day 1 of single agent topotecan. Patient last received Zometa on June 27, 2015. Return to clinic in 1 week for consideration of cycle 1, day 8. 2. Breast cancer: Patient completed 5 years of tamoxifen, no further workup or intervention is needed at this time. 3. Osteoporosis: Continue calcium and vitamin D supplementation. Patient will also be receiving Zometa for her bony metastasis. 4. Hyponatremia: WNL today. Likely secondary to underlying malignancy, monitor. 5. Bony metastasis: Zometa as above. 6. Hypokalemia: Continue oral supplementation as prescribed.  Patient expressed understanding and was in agreement with this plan. She also understands that She can call clinic at any time with any questions, concerns, or complaints.   Lloyd Huger, MD   07/27/2015 7:42 PM

## 2015-07-27 NOTE — Progress Notes (Signed)
[place]  [phone]) [phone] Fax:(336) [phone]  ID: Madison Fry OB: 17-Mar-1928  MR#: [id number]  OIZ#:[id number]  Patient Care Team: Mayra Neer, MD as PCP - General  CHIEF COMPLAINT:  Chief Complaint  Patient presents with  . Small cell lung cancer, right ([place]    INTERVAL HISTORY: Patient returns to clinic today for further evaluation and consideration of cycle 1, day 8 of single agent topotecan. She tolerated her first treatment well without significant side effects. Her weakness and fatigue is unchanged. She denies any fevers. She has no neurologic complaints. She denies any chest pain, shortness of breath, cough or hemoptysis. She denies any further nausea or vomiting. She denies any pain today. Patient offers no further specific complaints today.   REVIEW OF SYSTEMS:   Review of Systems  Constitutional: Positive for malaise/fatigue. Negative for fever.  Respiratory: Positive for shortness of breath. Negative for cough, hemoptysis and wheezing.   Cardiovascular: Negative.  Negative for chest pain.  Gastrointestinal: Negative for nausea, vomiting and diarrhea.  Genitourinary: Negative for dysuria and frequency.  Musculoskeletal: Negative.   Neurological: Positive for weakness.  Psychiatric/Behavioral: The patient is not nervous/anxious.     As per HPI. Otherwise, a complete review of systems is negatve.  PAST MEDICAL HISTORY: Past Medical History  Diagnosis Date  . Diabetes mellitus without complication ([place])   . Hypertension   . Cancer ([place])     breast cancer, Right side lymph nodes removed    PAST SURGICAL HISTORY: Right colectomy for benign disease, right lumpectomy.  FAMILY HISTORY: Reviewed and unchanged. No reported history of malignancy or chronic disease.     ADVANCED DIRECTIVES:    HEALTH MAINTENANCE: Social History  Substance Use Topics  . Smoking status: Former Smoker -- 1.50 packs/day    Types: Cigarettes    Quit  date: 08/27/1990  . Smokeless tobacco: Never Used  . Alcohol Use: No     Allergies  Allergen Reactions  . Shellfish Allergy Swelling    Current Outpatient Prescriptions  Medication Sig Dispense Refill  . albuterol (PROVENTIL) (2.5 MG/3ML) 0.083% nebulizer solution Take 3 mLs (2.5 mg total) by nebulization 3 (three) times daily. 252 mL 3  . colesevelam (WELCHOL) 625 MG tablet Take by mouth 2 (two) times daily with a meal.    . COMBIGAN 0.2-0.5 % ophthalmic solution PUT 1 DROP IN AFFECTED EYE EVERY 12 HOURS**NEED OFFICE VISIT**  0  . ezetimibe (ZETIA) 10 MG tablet Take 10 mg by mouth daily.    Marland Kitchen glimepiride (AMARYL) 2 MG tablet Take 2 mg by mouth daily with breakfast.    . HYDROcodone-acetaminophen (NORCO/VICODIN) 5-325 MG tablet TAKE 1-2 TABLETS BY MOUTH EVERY 8 HOURS AS NEEDED FOR PAIN  0  . levothyroxine (SYNTHROID, LEVOTHROID) 50 MCG tablet Take 50 mcg by mouth daily.    Marland Kitchen LORazepam (ATIVAN) 0.5 MG tablet     . losartan (COZAAR) 100 MG tablet Take 100 mg by mouth daily.    . metFORMIN (GLUCOPHAGE-XR) 500 MG 24 hr tablet TAKE 1 TABLET BY MOUTH WITH MEALS  2  . TRAVATAN Z 0.004 % SOLN ophthalmic solution PUT 1 DROP INTO BOTH EYES AT BEDTIME  0  . verapamil (CALAN-SR) 120 MG CR tablet Take 120 mg by mouth daily.    Marland Kitchen zolpidem (AMBIEN) 5 MG tablet Take 1 tablet (5 mg total) by mouth at bedtime as needed for sleep. 30 tablet 0   No current facility-administered medications for this visit.    OBJECTIVE: Filed  Vitals:   07/25/15 1346  BP: 130/65  Pulse: 93  Temp: 99.4 F (37.4 C)     Body mass index is 26.98 kg/(m^2).    ECOG FS:1 - Symptomatic but completely ambulatory  General: Well-developed, well-nourished, no acute distress. Eyes: Pink conjunctiva, anicteric sclera. Lungs: Scattered wheezing throughout. Heart: Regular rate and rhythm. No rubs, murmurs, or gallops. Abdomen: Soft, nontender, nondistended. No organomegaly noted, normoactive bowel sounds. Musculoskeletal: No  edema, cyanosis, or clubbing. Neuro: Alert, answering all questions appropriately. Cranial nerves grossly intact. Skin: No rashes or petechiae noted. Psych: Normal affect.  LAB RESULTS:  Lab Results  Component Value Date   NA 135 07/25/2015   K 3.0* 07/25/2015   CL 102 07/25/2015   CO2 25 07/25/2015   GLUCOSE 102* 07/25/2015   BUN 9 07/25/2015   CREATININE 0.60 07/25/2015   CALCIUM 8.4* 07/25/2015   PROT 7.0 05/02/2015   ALBUMIN 3.5 05/02/2015   AST 19 05/02/2015   ALT 9* 05/02/2015   ALKPHOS 71 05/02/2015   BILITOT 0.4 05/02/2015   GFRNONAA >60 07/25/2015   GFRAA >60 07/25/2015    Lab Results  Component Value Date   WBC 4.6 07/25/2015   NEUTROABS 3.3 07/25/2015   HGB 10.9* 07/25/2015   HCT 31.7* 07/25/2015   MCV 90.7 07/25/2015   PLT 187 07/25/2015     STUDIES: Ct Chest W Contrast  07/12/2015  CLINICAL DATA:  Restaging small cell lung cancer. Chemotherapy and radiation therapy completed. Patient reports weight loss. EXAM: CT CHEST, ABDOMEN, AND PELVIS WITH CONTRAST TECHNIQUE: Multidetector CT imaging of the chest, abdomen and pelvis was performed following the standard protocol during bolus administration of intravenous contrast. CONTRAST:  110m ISOVUE-300 IOPAMIDOL (ISOVUE-300) INJECTION 61% COMPARISON:  Chest CT 04/10/2015.  PET-CT 04/24/2015 FINDINGS: CT CHEST Cardiovascular: The heart size is normal. There is no pericardial effusion. There is diffuse atherosclerosis of the aorta, great vessels and coronary arteries. No acute vascular findings are seen. Mediastinum/Nodes: Interval near-complete resolution of previously demonstrated extensive right supraclavicular, mediastinal and right hilar adenopathy. There is mild residual ill-defined soft tissue around the right hilum, extending into the subcarinal region. There is no discrete residual adenopathy. There is no axillary adenopathy. The thyroid gland, trachea and esophagus demonstrate no significant findings.  Lungs/Pleura: A moderate-sized dependent right pleural effusion is similar in volume to the PET-CT. This measures near water density and demonstrates no associated nodularity. There is no significant pleural fluid on the left. There has been interval partial re-expansion of the right upper lobe. There is residual volume loss and suprahilar pulmonary opacity without well-defined mass. There is improved attenuation of the right-sided bronchi without focal endobronchial lesion. There is mild dependent atelectasis in the right lower lobe. There is also patchy atelectasis in the left lung. No new or enlarging pulmonary nodules identified. Musculoskeletal/Chest wall: There are multiple ill-defined sclerotic lesions in the spine, likely representing treated metastatic disease. No lytic lesion or pathologic fracture identified. CT ABDOMEN AND PELVIS FINDINGS Hepatobiliary: The liver is normal in density without focal abnormality. No evidence of gallstones, gallbladder wall thickening or biliary dilatation. Pancreas: Unremarkable. No pancreatic ductal dilatation or surrounding inflammatory changes. Spleen: Normal in size without focal abnormality. Adrenals/Urinary Tract: There is stable mild prominence of the left adrenal gland without focal nodule. The right adrenal gland appears normal. There is a stable simple cyst in the upper pole the left kidney. The right kidney appears normal. No evidence of urinary tract calculus or hydronephrosis. The bladder appears unremarkable. Stomach/Bowel: No  evidence of bowel wall thickening, distention or surrounding inflammatory change. There are postsurgical changes from previous right hemicolectomy. Vascular/Lymphatic: There are no enlarged abdominal or pelvic lymph nodes. Diffuse aortic and branch vessel atherosclerosis. Reproductive: The uterus and ovaries appear stable. There is a fatty lesion in the left adnexal with peripheral calcifications measuring 2.6 cm on image 104, unchanged.  This may reflect a chronic dermoid. Other: Stable postsurgical changes in the omentum with a tubular area of fat measuring up to 4.0 cm transverse on image 93 and up to 8 cm in length on coronal image 42. No ascites or peritoneal nodularity. Musculoskeletal: There are numerous ill-defined sclerotic lesions throughout the lumbar spine, pelvis and proximal femurs consistent with treated metastatic disease. There is severe osteoarthritis of both hips with bilateral hip joint effusions, worse on the right. There is posttraumatic deformity of the pubic rami on the left. IMPRESSION: 1. Significant response to therapy in the thoracic malignancy. There is partial re-expansion of the right upper lobe with near-complete resolution of the previously demonstrated right supraclavicular, right hilar and mediastinal lymphadenopathy. 2. Persistent moderate size dependent right pleural effusion without malignant features. Patchy atelectasis in both lungs. 3. Multiple sclerotic osseous lesions consistent with treated metastases. No lytic lesion or pathologic fracture identified. 4. No evidence of extra osseous metastatic disease in the abdomen or pelvis. 5. Atherosclerosis, postsurgical changes in the abdomen, a peripherally calcified fatty left adnexal lesion and severe hip arthropathy noted bilaterally. Electronically Signed   By: Richardean Sale M.D.   On: 07/12/2015 14:43   Ct Abdomen Pelvis W Contrast  07/12/2015  CLINICAL DATA:  Restaging small cell lung cancer. Chemotherapy and radiation therapy completed. Patient reports weight loss. EXAM: CT CHEST, ABDOMEN, AND PELVIS WITH CONTRAST TECHNIQUE: Multidetector CT imaging of the chest, abdomen and pelvis was performed following the standard protocol during bolus administration of intravenous contrast. CONTRAST:  15m ISOVUE-300 IOPAMIDOL (ISOVUE-300) INJECTION 61% COMPARISON:  Chest CT 04/10/2015.  PET-CT 04/24/2015 FINDINGS: CT CHEST Cardiovascular: The heart size is normal.  There is no pericardial effusion. There is diffuse atherosclerosis of the aorta, great vessels and coronary arteries. No acute vascular findings are seen. Mediastinum/Nodes: Interval near-complete resolution of previously demonstrated extensive right supraclavicular, mediastinal and right hilar adenopathy. There is mild residual ill-defined soft tissue around the right hilum, extending into the subcarinal region. There is no discrete residual adenopathy. There is no axillary adenopathy. The thyroid gland, trachea and esophagus demonstrate no significant findings. Lungs/Pleura: A moderate-sized dependent right pleural effusion is similar in volume to the PET-CT. This measures near water density and demonstrates no associated nodularity. There is no significant pleural fluid on the left. There has been interval partial re-expansion of the right upper lobe. There is residual volume loss and suprahilar pulmonary opacity without well-defined mass. There is improved attenuation of the right-sided bronchi without focal endobronchial lesion. There is mild dependent atelectasis in the right lower lobe. There is also patchy atelectasis in the left lung. No new or enlarging pulmonary nodules identified. Musculoskeletal/Chest wall: There are multiple ill-defined sclerotic lesions in the spine, likely representing treated metastatic disease. No lytic lesion or pathologic fracture identified. CT ABDOMEN AND PELVIS FINDINGS Hepatobiliary: The liver is normal in density without focal abnormality. No evidence of gallstones, gallbladder wall thickening or biliary dilatation. Pancreas: Unremarkable. No pancreatic ductal dilatation or surrounding inflammatory changes. Spleen: Normal in size without focal abnormality. Adrenals/Urinary Tract: There is stable mild prominence of the left adrenal gland without focal nodule. The right adrenal gland  appears normal. There is a stable simple cyst in the upper pole the left kidney. The right  kidney appears normal. No evidence of urinary tract calculus or hydronephrosis. The bladder appears unremarkable. Stomach/Bowel: No evidence of bowel wall thickening, distention or surrounding inflammatory change. There are postsurgical changes from previous right hemicolectomy. Vascular/Lymphatic: There are no enlarged abdominal or pelvic lymph nodes. Diffuse aortic and branch vessel atherosclerosis. Reproductive: The uterus and ovaries appear stable. There is a fatty lesion in the left adnexal with peripheral calcifications measuring 2.6 cm on image 104, unchanged. This may reflect a chronic dermoid. Other: Stable postsurgical changes in the omentum with a tubular area of fat measuring up to 4.0 cm transverse on image 93 and up to 8 cm in length on coronal image 42. No ascites or peritoneal nodularity. Musculoskeletal: There are numerous ill-defined sclerotic lesions throughout the lumbar spine, pelvis and proximal femurs consistent with treated metastatic disease. There is severe osteoarthritis of both hips with bilateral hip joint effusions, worse on the right. There is posttraumatic deformity of the pubic rami on the left. IMPRESSION: 1. Significant response to therapy in the thoracic malignancy. There is partial re-expansion of the right upper lobe with near-complete resolution of the previously demonstrated right supraclavicular, right hilar and mediastinal lymphadenopathy. 2. Persistent moderate size dependent right pleural effusion without malignant features. Patchy atelectasis in both lungs. 3. Multiple sclerotic osseous lesions consistent with treated metastases. No lytic lesion or pathologic fracture identified. 4. No evidence of extra osseous metastatic disease in the abdomen or pelvis. 5. Atherosclerosis, postsurgical changes in the abdomen, a peripherally calcified fatty left adnexal lesion and severe hip arthropathy noted bilaterally. Electronically Signed   By: Richardean Sale M.D.   On: 07/12/2015  14:43    ASSESSMENT: Stage Ia adenocarcinoma of the right breast, now with stage IV small cell lung cancer with multiple bony lesions.   PLAN:    1. Small cell lung cancer, stage IV with metastasis to the bone: Restaging CT scan reviewed independently and reported as above with significant response to XRT with her thoracic mass. Patient continues to have persistent metastatic disease. MRI the brain did not reveal any intracranial abnormality. Proceed with cycle 1, day 8 of single agent topotecan. Patient last received Zometa on June 27, 2015. Return to clinic in 1 week for consideration of cycle 1, day 15. 2. Breast cancer: Patient completed 5 years of tamoxifen, no further workup or intervention is needed at this time. 3. Osteoporosis: Continue calcium and vitamin D supplementation. Patient will also be receiving Zometa for her bony metastasis. 4. Hyponatremia: WNL today. Likely secondary to underlying malignancy, monitor. 5. Bony metastasis: Zometa as above. 6. Hypokalemia: Continue oral supplementation as prescribed.  Patient expressed understanding and was in agreement with this plan. She also understands that She can call clinic at any time with any questions, concerns, or complaints.   Lloyd Huger, MD   07/27/2015 7:46 PM

## 2015-08-01 ENCOUNTER — Other Ambulatory Visit: Payer: Self-pay

## 2015-08-01 ENCOUNTER — Inpatient Hospital Stay: Payer: Commercial Managed Care - HMO

## 2015-08-01 ENCOUNTER — Inpatient Hospital Stay: Payer: Commercial Managed Care - HMO | Attending: Oncology

## 2015-08-01 ENCOUNTER — Inpatient Hospital Stay (HOSPITAL_BASED_OUTPATIENT_CLINIC_OR_DEPARTMENT_OTHER): Payer: Commercial Managed Care - HMO | Admitting: Oncology

## 2015-08-01 VITALS — BP 109/63 | HR 109 | Temp 98.4°F | Resp 18 | Wt 157.2 lb

## 2015-08-01 DIAGNOSIS — D696 Thrombocytopenia, unspecified: Secondary | ICD-10-CM | POA: Insufficient documentation

## 2015-08-01 DIAGNOSIS — C3411 Malignant neoplasm of upper lobe, right bronchus or lung: Secondary | ICD-10-CM | POA: Diagnosis not present

## 2015-08-01 DIAGNOSIS — N390 Urinary tract infection, site not specified: Secondary | ICD-10-CM | POA: Diagnosis not present

## 2015-08-01 DIAGNOSIS — E871 Hypo-osmolality and hyponatremia: Secondary | ICD-10-CM

## 2015-08-01 DIAGNOSIS — R590 Localized enlarged lymph nodes: Secondary | ICD-10-CM | POA: Diagnosis not present

## 2015-08-01 DIAGNOSIS — Z853 Personal history of malignant neoplasm of breast: Secondary | ICD-10-CM | POA: Insufficient documentation

## 2015-08-01 DIAGNOSIS — T451X5S Adverse effect of antineoplastic and immunosuppressive drugs, sequela: Secondary | ICD-10-CM | POA: Diagnosis not present

## 2015-08-01 DIAGNOSIS — R5383 Other fatigue: Secondary | ICD-10-CM | POA: Insufficient documentation

## 2015-08-01 DIAGNOSIS — M818 Other osteoporosis without current pathological fracture: Secondary | ICD-10-CM | POA: Insufficient documentation

## 2015-08-01 DIAGNOSIS — R110 Nausea: Secondary | ICD-10-CM | POA: Diagnosis not present

## 2015-08-01 DIAGNOSIS — C7951 Secondary malignant neoplasm of bone: Secondary | ICD-10-CM

## 2015-08-01 DIAGNOSIS — D72819 Decreased white blood cell count, unspecified: Secondary | ICD-10-CM | POA: Diagnosis not present

## 2015-08-01 DIAGNOSIS — R109 Unspecified abdominal pain: Secondary | ICD-10-CM | POA: Insufficient documentation

## 2015-08-01 DIAGNOSIS — N281 Cyst of kidney, acquired: Secondary | ICD-10-CM | POA: Diagnosis not present

## 2015-08-01 DIAGNOSIS — E119 Type 2 diabetes mellitus without complications: Secondary | ICD-10-CM

## 2015-08-01 DIAGNOSIS — C3491 Malignant neoplasm of unspecified part of right bronchus or lung: Secondary | ICD-10-CM

## 2015-08-01 DIAGNOSIS — I2510 Atherosclerotic heart disease of native coronary artery without angina pectoris: Secondary | ICD-10-CM | POA: Insufficient documentation

## 2015-08-01 DIAGNOSIS — J90 Pleural effusion, not elsewhere classified: Secondary | ICD-10-CM | POA: Insufficient documentation

## 2015-08-01 DIAGNOSIS — Z7984 Long term (current) use of oral hypoglycemic drugs: Secondary | ICD-10-CM | POA: Insufficient documentation

## 2015-08-01 DIAGNOSIS — Z79899 Other long term (current) drug therapy: Secondary | ICD-10-CM | POA: Insufficient documentation

## 2015-08-01 DIAGNOSIS — R300 Dysuria: Secondary | ICD-10-CM | POA: Diagnosis not present

## 2015-08-01 DIAGNOSIS — M160 Bilateral primary osteoarthritis of hip: Secondary | ICD-10-CM | POA: Diagnosis not present

## 2015-08-01 DIAGNOSIS — R634 Abnormal weight loss: Secondary | ICD-10-CM | POA: Diagnosis not present

## 2015-08-01 DIAGNOSIS — Z5111 Encounter for antineoplastic chemotherapy: Secondary | ICD-10-CM | POA: Insufficient documentation

## 2015-08-01 DIAGNOSIS — R531 Weakness: Secondary | ICD-10-CM

## 2015-08-01 DIAGNOSIS — Z87891 Personal history of nicotine dependence: Secondary | ICD-10-CM

## 2015-08-01 DIAGNOSIS — I10 Essential (primary) hypertension: Secondary | ICD-10-CM | POA: Diagnosis not present

## 2015-08-01 LAB — CBC WITH DIFFERENTIAL/PLATELET
BASOS ABS: 0 10*3/uL (ref 0–0.1)
Basophils Relative: 1 %
Eosinophils Absolute: 0 10*3/uL (ref 0–0.7)
HEMATOCRIT: 29.3 % — AB (ref 35.0–47.0)
Hemoglobin: 10.3 g/dL — ABNORMAL LOW (ref 12.0–16.0)
LYMPHS ABS: 0.6 10*3/uL — AB (ref 1.0–3.6)
MCH: 31.6 pg (ref 26.0–34.0)
MCHC: 35 g/dL (ref 32.0–36.0)
MCV: 90.4 fL (ref 80.0–100.0)
MONO ABS: 0.1 10*3/uL — AB (ref 0.2–0.9)
Monocytes Relative: 3 %
NEUTROS ABS: 1.5 10*3/uL (ref 1.4–6.5)
Platelets: 66 10*3/uL — ABNORMAL LOW (ref 150–440)
RBC: 3.25 MIL/uL — AB (ref 3.80–5.20)
RDW: 17.4 % — AB (ref 11.5–14.5)
WBC: 2.2 10*3/uL — AB (ref 3.6–11.0)

## 2015-08-01 LAB — BASIC METABOLIC PANEL
ANION GAP: 7 (ref 5–15)
BUN: 11 mg/dL (ref 6–20)
CO2: 23 mmol/L (ref 22–32)
Calcium: 8.8 mg/dL — ABNORMAL LOW (ref 8.9–10.3)
Chloride: 101 mmol/L (ref 101–111)
Creatinine, Ser: 0.71 mg/dL (ref 0.44–1.00)
GFR calc Af Amer: >60 mL/min (ref >60)
GFR calc non Af Amer: >60 mL/min (ref >60)
GLUCOSE: 117 mg/dL — AB (ref 65–99)
POTASSIUM: 3.7 mmol/L (ref 3.5–5.1)
Sodium: 131 mmol/L — ABNORMAL LOW (ref 135–145)

## 2015-08-01 NOTE — Progress Notes (Signed)
[place]  Telephone:(336) [phone] [phone]) [phone]  ID: Madison Fry OB: [birth year]  MR#: [id number]  QIH#:[id number]  Patient Care Team: Mayra Neer, MD as PCP - General  CHIEF COMPLAINT: Stage IV small cell lung cancer with multiple bony lesions.    INTERVAL HISTORY: Patient returns to clinic today for further evaluation and consideration of cycle 1, day 15 of single agent topotecan. She had increased nausea and "upset stomach" this week. She also admits to increased weakness and fatigue. She denies any fevers. She has no neurologic complaints. She denies any chest pain, shortness of breath, cough or hemoptysis. She denies any pain today. Patient offers no further specific complaints today.   REVIEW OF SYSTEMS:   Review of Systems  Constitutional: Positive for malaise/fatigue. Negative for fever.  Respiratory: Positive for shortness of breath. Negative for cough, hemoptysis and wheezing.   Cardiovascular: Negative.  Negative for chest pain.  Gastrointestinal: Positive for nausea. Negative for vomiting and diarrhea.  Genitourinary: Negative for dysuria and frequency.  Musculoskeletal: Negative.   Neurological: Positive for weakness.  Psychiatric/Behavioral: The patient is not nervous/anxious.     As per HPI. Otherwise, a complete review of systems is negatve.  PAST MEDICAL HISTORY: Past Medical History  Diagnosis Date  . Diabetes mellitus without complication ([place])   . Hypertension   . Cancer ([place])     breast cancer, Right side lymph nodes removed    PAST SURGICAL HISTORY: Right colectomy for benign disease, right lumpectomy.  FAMILY HISTORY: Reviewed and unchanged. No reported history of malignancy or chronic disease.     ADVANCED DIRECTIVES:    HEALTH MAINTENANCE: Social History  Substance Use Topics  . Smoking status: Former Smoker -- 1.50 packs/day    Types: Cigarettes    Quit date: 08/27/1990  . Smokeless tobacco: Never Used  .  Alcohol Use: No     Allergies  Allergen Reactions  . Shellfish Allergy Swelling    Current Outpatient Prescriptions  Medication Sig Dispense Refill  . albuterol (PROVENTIL) (2.5 MG/3ML) 0.083% nebulizer solution Take 3 mLs (2.5 mg total) by nebulization 3 (three) times daily. 252 mL 3  . colesevelam (WELCHOL) 625 MG tablet Take by mouth 2 (two) times daily with a meal.    . COMBIGAN 0.2-0.5 % ophthalmic solution PUT 1 DROP IN AFFECTED EYE EVERY 12 HOURS**NEED OFFICE VISIT**  0  . ezetimibe (ZETIA) 10 MG tablet Take 10 mg by mouth daily.    Marland Kitchen glimepiride (AMARYL) 2 MG tablet Take 2 mg by mouth daily with breakfast.    . HYDROcodone-acetaminophen (NORCO/VICODIN) 5-325 MG tablet TAKE 1-2 TABLETS BY MOUTH EVERY 8 HOURS AS NEEDED FOR PAIN  0  . levothyroxine (SYNTHROID, LEVOTHROID) 50 MCG tablet Take 50 mcg by mouth daily.    Marland Kitchen LORazepam (ATIVAN) 0.5 MG tablet     . losartan (COZAAR) 100 MG tablet Take 100 mg by mouth daily.    . metFORMIN (GLUCOPHAGE-XR) 500 MG 24 hr tablet TAKE 1 TABLET BY MOUTH WITH MEALS  2  . TRAVATAN Z 0.004 % SOLN ophthalmic solution PUT 1 DROP INTO BOTH EYES AT BEDTIME  0  . verapamil (CALAN-SR) 120 MG CR tablet Take 120 mg by mouth daily.    Marland Kitchen zolpidem (AMBIEN) 5 MG tablet Take 1 tablet (5 mg total) by mouth at bedtime as needed for sleep. 30 tablet 0   No current facility-administered medications for this visit.    OBJECTIVE: Filed Vitals:   08/01/15 0934  BP: 109/63  Pulse: 109  Temp: 98.4 F (36.9 C)  Resp: 18     Body mass index is 26.57 kg/(m^2).    ECOG FS:1 - Symptomatic but completely ambulatory  General: Well-developed, well-nourished, no acute distress. Eyes: Pink conjunctiva, anicteric sclera. Lungs: Scattered wheezing throughout. Heart: Regular rate and rhythm. No rubs, murmurs, or gallops. Abdomen: Soft, nontender, nondistended. No organomegaly noted, normoactive bowel sounds. Musculoskeletal: No edema, cyanosis, or clubbing. Neuro: Alert,  answering all questions appropriately. Cranial nerves grossly intact. Skin: No rashes or petechiae noted. Psych: Normal affect.  LAB RESULTS:  Lab Results  Component Value Date   NA 131* 08/01/2015   K 3.7 08/01/2015   CL 101 08/01/2015   CO2 23 08/01/2015   GLUCOSE 117* 08/01/2015   BUN 11 08/01/2015   CREATININE 0.71 08/01/2015   CALCIUM 8.8* 08/01/2015   PROT 7.0 05/02/2015   ALBUMIN 3.5 05/02/2015   AST 19 05/02/2015   ALT 9* 05/02/2015   ALKPHOS 71 05/02/2015   BILITOT 0.4 05/02/2015   GFRNONAA >60 08/01/2015   GFRAA >60 08/01/2015    Lab Results  Component Value Date   WBC 2.2* 08/01/2015   NEUTROABS 1.5 08/01/2015   HGB 10.3* 08/01/2015   HCT 29.3* 08/01/2015   MCV 90.4 08/01/2015   PLT 66* 08/01/2015     STUDIES: Ct Chest W Contrast  07/12/2015  CLINICAL DATA:  Restaging small cell lung cancer. Chemotherapy and radiation therapy completed. Patient reports weight loss. EXAM: CT CHEST, ABDOMEN, AND PELVIS WITH CONTRAST TECHNIQUE: Multidetector CT imaging of the chest, abdomen and pelvis was performed following the standard protocol during bolus administration of intravenous contrast. CONTRAST:  194m ISOVUE-300 IOPAMIDOL (ISOVUE-300) INJECTION 61% COMPARISON:  Chest CT 04/10/2015.  PET-CT 04/24/2015 FINDINGS: CT CHEST Cardiovascular: The heart size is normal. There is no pericardial effusion. There is diffuse atherosclerosis of the aorta, great vessels and coronary arteries. No acute vascular findings are seen. Mediastinum/Nodes: Interval near-complete resolution of previously demonstrated extensive right supraclavicular, mediastinal and right hilar adenopathy. There is mild residual ill-defined soft tissue around the right hilum, extending into the subcarinal region. There is no discrete residual adenopathy. There is no axillary adenopathy. The thyroid gland, trachea and esophagus demonstrate no significant findings. Lungs/Pleura: A moderate-sized dependent right pleural  effusion is similar in volume to the PET-CT. This measures near water density and demonstrates no associated nodularity. There is no significant pleural fluid on the left. There has been interval partial re-expansion of the right upper lobe. There is residual volume loss and suprahilar pulmonary opacity without well-defined mass. There is improved attenuation of the right-sided bronchi without focal endobronchial lesion. There is mild dependent atelectasis in the right lower lobe. There is also patchy atelectasis in the left lung. No new or enlarging pulmonary nodules identified. Musculoskeletal/Chest wall: There are multiple ill-defined sclerotic lesions in the spine, likely representing treated metastatic disease. No lytic lesion or pathologic fracture identified. CT ABDOMEN AND PELVIS FINDINGS Hepatobiliary: The liver is normal in density without focal abnormality. No evidence of gallstones, gallbladder wall thickening or biliary dilatation. Pancreas: Unremarkable. No pancreatic ductal dilatation or surrounding inflammatory changes. Spleen: Normal in size without focal abnormality. Adrenals/Urinary Tract: There is stable mild prominence of the left adrenal gland without focal nodule. The right adrenal gland appears normal. There is a stable simple cyst in the upper pole the left kidney. The right kidney appears normal. No evidence of urinary tract calculus or hydronephrosis. The bladder appears unremarkable. Stomach/Bowel: No evidence of bowel wall thickening, distention  or surrounding inflammatory change. There are postsurgical changes from previous right hemicolectomy. Vascular/Lymphatic: There are no enlarged abdominal or pelvic lymph nodes. Diffuse aortic and branch vessel atherosclerosis. Reproductive: The uterus and ovaries appear stable. There is a fatty lesion in the left adnexal with peripheral calcifications measuring 2.6 cm on image 104, unchanged. This may reflect a chronic dermoid. Other: Stable  postsurgical changes in the omentum with a tubular area of fat measuring up to 4.0 cm transverse on image 93 and up to 8 cm in length on coronal image 42. No ascites or peritoneal nodularity. Musculoskeletal: There are numerous ill-defined sclerotic lesions throughout the lumbar spine, pelvis and proximal femurs consistent with treated metastatic disease. There is severe osteoarthritis of both hips with bilateral hip joint effusions, worse on the right. There is posttraumatic deformity of the pubic rami on the left. IMPRESSION: 1. Significant response to therapy in the thoracic malignancy. There is partial re-expansion of the right upper lobe with near-complete resolution of the previously demonstrated right supraclavicular, right hilar and mediastinal lymphadenopathy. 2. Persistent moderate size dependent right pleural effusion without malignant features. Patchy atelectasis in both lungs. 3. Multiple sclerotic osseous lesions consistent with treated metastases. No lytic lesion or pathologic fracture identified. 4. No evidence of extra osseous metastatic disease in the abdomen or pelvis. 5. Atherosclerosis, postsurgical changes in the abdomen, a peripherally calcified fatty left adnexal lesion and severe hip arthropathy noted bilaterally. Electronically Signed   By: Richardean Sale M.D.   On: 07/12/2015 14:43   Ct Abdomen Pelvis W Contrast  07/12/2015  CLINICAL DATA:  Restaging small cell lung cancer. Chemotherapy and radiation therapy completed. Patient reports weight loss. EXAM: CT CHEST, ABDOMEN, AND PELVIS WITH CONTRAST TECHNIQUE: Multidetector CT imaging of the chest, abdomen and pelvis was performed following the standard protocol during bolus administration of intravenous contrast. CONTRAST:  176m ISOVUE-300 IOPAMIDOL (ISOVUE-300) INJECTION 61% COMPARISON:  Chest CT 04/10/2015.  PET-CT 04/24/2015 FINDINGS: CT CHEST Cardiovascular: The heart size is normal. There is no pericardial effusion. There is  diffuse atherosclerosis of the aorta, great vessels and coronary arteries. No acute vascular findings are seen. Mediastinum/Nodes: Interval near-complete resolution of previously demonstrated extensive right supraclavicular, mediastinal and right hilar adenopathy. There is mild residual ill-defined soft tissue around the right hilum, extending into the subcarinal region. There is no discrete residual adenopathy. There is no axillary adenopathy. The thyroid gland, trachea and esophagus demonstrate no significant findings. Lungs/Pleura: A moderate-sized dependent right pleural effusion is similar in volume to the PET-CT. This measures near water density and demonstrates no associated nodularity. There is no significant pleural fluid on the left. There has been interval partial re-expansion of the right upper lobe. There is residual volume loss and suprahilar pulmonary opacity without well-defined mass. There is improved attenuation of the right-sided bronchi without focal endobronchial lesion. There is mild dependent atelectasis in the right lower lobe. There is also patchy atelectasis in the left lung. No new or enlarging pulmonary nodules identified. Musculoskeletal/Chest wall: There are multiple ill-defined sclerotic lesions in the spine, likely representing treated metastatic disease. No lytic lesion or pathologic fracture identified. CT ABDOMEN AND PELVIS FINDINGS Hepatobiliary: The liver is normal in density without focal abnormality. No evidence of gallstones, gallbladder wall thickening or biliary dilatation. Pancreas: Unremarkable. No pancreatic ductal dilatation or surrounding inflammatory changes. Spleen: Normal in size without focal abnormality. Adrenals/Urinary Tract: There is stable mild prominence of the left adrenal gland without focal nodule. The right adrenal gland appears normal. There is a stable  simple cyst in the upper pole the left kidney. The right kidney appears normal. No evidence of urinary  tract calculus or hydronephrosis. The bladder appears unremarkable. Stomach/Bowel: No evidence of bowel wall thickening, distention or surrounding inflammatory change. There are postsurgical changes from previous right hemicolectomy. Vascular/Lymphatic: There are no enlarged abdominal or pelvic lymph nodes. Diffuse aortic and branch vessel atherosclerosis. Reproductive: The uterus and ovaries appear stable. There is a fatty lesion in the left adnexal with peripheral calcifications measuring 2.6 cm on image 104, unchanged. This may reflect a chronic dermoid. Other: Stable postsurgical changes in the omentum with a tubular area of fat measuring up to 4.0 cm transverse on image 93 and up to 8 cm in length on coronal image 42. No ascites or peritoneal nodularity. Musculoskeletal: There are numerous ill-defined sclerotic lesions throughout the lumbar spine, pelvis and proximal femurs consistent with treated metastatic disease. There is severe osteoarthritis of both hips with bilateral hip joint effusions, worse on the right. There is posttraumatic deformity of the pubic rami on the left. IMPRESSION: 1. Significant response to therapy in the thoracic malignancy. There is partial re-expansion of the right upper lobe with near-complete resolution of the previously demonstrated right supraclavicular, right hilar and mediastinal lymphadenopathy. 2. Persistent moderate size dependent right pleural effusion without malignant features. Patchy atelectasis in both lungs. 3. Multiple sclerotic osseous lesions consistent with treated metastases. No lytic lesion or pathologic fracture identified. 4. No evidence of extra osseous metastatic disease in the abdomen or pelvis. 5. Atherosclerosis, postsurgical changes in the abdomen, a peripherally calcified fatty left adnexal lesion and severe hip arthropathy noted bilaterally. Electronically Signed   By: Richardean Sale M.D.   On: 07/12/2015 14:43    ASSESSMENT: Stage Ia  adenocarcinoma of the right breast, now with stage IV small cell lung cancer with multiple bony lesions.   PLAN:    1. Small cell lung cancer, stage IV with metastasis to the bone: Restaging CT scan reviewed independently and reported as above with significant response to XRT with her thoracic mass. Patient continues to have persistent metastatic disease. MRI the brain did not reveal any intracranial abnormality. Delay cycle 1, day 15 of single agent topotecan secondary to thrombocytopenia. Patient last received Zometa on June 27, 2015. Return to clinic in 1 week for reconsideration of treatment. Patient may only be able to tolerate treatment on days 1 and 8. 2. Breast cancer: Patient completed 5 years of tamoxifen, no further workup or intervention is needed at this time. 3. Osteoporosis: Continue calcium and vitamin D supplementation. Patient will also be receiving Zometa for her bony metastasis. 4. Hyponatremia: WNL today. Likely secondary to underlying malignancy, monitor. 5. Bony metastasis: Zometa as above. 6. Hypokalemia: Resolved. Continue oral supplementation as prescribed.  7. Thrombocytopenia: Secondary to chemotherapy, delay treatment as above. 8. Leukopenia: Secondary to chemotherapy, monitor.  Patient expressed understanding and was in agreement with this plan. She also understands that She can call clinic at any time with any questions, concerns, or complaints.   Lloyd Huger, MD   08/01/2015 11:52 AM

## 2015-08-01 NOTE — Progress Notes (Signed)
States since last treatment has had "irritable stomach" and has noticed abdominal discomfort. Denies N/V/D.

## 2015-08-07 ENCOUNTER — Other Ambulatory Visit: Payer: Self-pay | Admitting: *Deleted

## 2015-08-07 DIAGNOSIS — N390 Urinary tract infection, site not specified: Secondary | ICD-10-CM

## 2015-08-08 ENCOUNTER — Inpatient Hospital Stay: Payer: Commercial Managed Care - HMO

## 2015-08-08 ENCOUNTER — Other Ambulatory Visit: Payer: Self-pay | Admitting: *Deleted

## 2015-08-08 ENCOUNTER — Inpatient Hospital Stay (HOSPITAL_BASED_OUTPATIENT_CLINIC_OR_DEPARTMENT_OTHER): Payer: Commercial Managed Care - HMO | Admitting: Oncology

## 2015-08-08 VITALS — BP 119/64 | HR 85 | Temp 97.6°F | Resp 18

## 2015-08-08 VITALS — BP 118/69 | HR 103 | Temp 98.2°F | Resp 18 | Wt 158.0 lb

## 2015-08-08 DIAGNOSIS — E119 Type 2 diabetes mellitus without complications: Secondary | ICD-10-CM

## 2015-08-08 DIAGNOSIS — C3491 Malignant neoplasm of unspecified part of right bronchus or lung: Secondary | ICD-10-CM

## 2015-08-08 DIAGNOSIS — D696 Thrombocytopenia, unspecified: Secondary | ICD-10-CM

## 2015-08-08 DIAGNOSIS — R5383 Other fatigue: Secondary | ICD-10-CM

## 2015-08-08 DIAGNOSIS — Z5111 Encounter for antineoplastic chemotherapy: Secondary | ICD-10-CM | POA: Diagnosis not present

## 2015-08-08 DIAGNOSIS — C7951 Secondary malignant neoplasm of bone: Secondary | ICD-10-CM | POA: Diagnosis not present

## 2015-08-08 DIAGNOSIS — C3411 Malignant neoplasm of upper lobe, right bronchus or lung: Secondary | ICD-10-CM

## 2015-08-08 DIAGNOSIS — N390 Urinary tract infection, site not specified: Secondary | ICD-10-CM

## 2015-08-08 DIAGNOSIS — Z7984 Long term (current) use of oral hypoglycemic drugs: Secondary | ICD-10-CM

## 2015-08-08 DIAGNOSIS — M818 Other osteoporosis without current pathological fracture: Secondary | ICD-10-CM | POA: Diagnosis not present

## 2015-08-08 DIAGNOSIS — J90 Pleural effusion, not elsewhere classified: Secondary | ICD-10-CM

## 2015-08-08 DIAGNOSIS — R531 Weakness: Secondary | ICD-10-CM

## 2015-08-08 DIAGNOSIS — Z853 Personal history of malignant neoplasm of breast: Secondary | ICD-10-CM

## 2015-08-08 DIAGNOSIS — R590 Localized enlarged lymph nodes: Secondary | ICD-10-CM

## 2015-08-08 DIAGNOSIS — R300 Dysuria: Secondary | ICD-10-CM

## 2015-08-08 DIAGNOSIS — M160 Bilateral primary osteoarthritis of hip: Secondary | ICD-10-CM

## 2015-08-08 DIAGNOSIS — E871 Hypo-osmolality and hyponatremia: Secondary | ICD-10-CM

## 2015-08-08 DIAGNOSIS — R634 Abnormal weight loss: Secondary | ICD-10-CM

## 2015-08-08 DIAGNOSIS — I2510 Atherosclerotic heart disease of native coronary artery without angina pectoris: Secondary | ICD-10-CM

## 2015-08-08 DIAGNOSIS — Z79899 Other long term (current) drug therapy: Secondary | ICD-10-CM

## 2015-08-08 DIAGNOSIS — Z87891 Personal history of nicotine dependence: Secondary | ICD-10-CM

## 2015-08-08 DIAGNOSIS — R110 Nausea: Secondary | ICD-10-CM

## 2015-08-08 DIAGNOSIS — I10 Essential (primary) hypertension: Secondary | ICD-10-CM

## 2015-08-08 DIAGNOSIS — R109 Unspecified abdominal pain: Secondary | ICD-10-CM

## 2015-08-08 DIAGNOSIS — N281 Cyst of kidney, acquired: Secondary | ICD-10-CM

## 2015-08-08 LAB — URINALYSIS COMPLETE WITH MICROSCOPIC (ARMC ONLY)
Bilirubin Urine: NEGATIVE
Glucose, UA: 50 mg/dL — AB
HGB URINE DIPSTICK: NEGATIVE
Nitrite: POSITIVE — AB
PH: 5 (ref 5.0–8.0)
Protein, ur: NEGATIVE mg/dL
Specific Gravity, Urine: 1.023 (ref 1.005–1.030)

## 2015-08-08 LAB — CBC WITH DIFFERENTIAL/PLATELET
Basophils Absolute: 0 10*3/uL (ref 0–0.1)
Basophils Relative: 0 %
EOS ABS: 0 10*3/uL (ref 0–0.7)
EOS PCT: 1 %
HCT: 29.4 % — ABNORMAL LOW (ref 35.0–47.0)
HEMOGLOBIN: 10.3 g/dL — AB (ref 12.0–16.0)
LYMPHS ABS: 0.6 10*3/uL — AB (ref 1.0–3.6)
LYMPHS PCT: 14 %
MCH: 31.8 pg (ref 26.0–34.0)
MCHC: 35 g/dL (ref 32.0–36.0)
MCV: 90.8 fL (ref 80.0–100.0)
MONOS PCT: 16 %
Monocytes Absolute: 0.6 10*3/uL (ref 0.2–0.9)
Neutro Abs: 2.7 10*3/uL (ref 1.4–6.5)
Neutrophils Relative %: 69 %
Platelets: 395 10*3/uL (ref 150–440)
RBC: 3.24 MIL/uL — AB (ref 3.80–5.20)
RDW: 18.5 % — ABNORMAL HIGH (ref 11.5–14.5)
WBC: 3.9 10*3/uL (ref 3.6–11.0)

## 2015-08-08 LAB — BASIC METABOLIC PANEL
Anion gap: 3 — ABNORMAL LOW (ref 5–15)
BUN: 15 mg/dL (ref 6–20)
CHLORIDE: 102 mmol/L (ref 101–111)
CO2: 26 mmol/L (ref 22–32)
CREATININE: 0.49 mg/dL (ref 0.44–1.00)
Calcium: 7.8 mg/dL — ABNORMAL LOW (ref 8.9–10.3)
GFR calc Af Amer: >60 mL/min (ref >60)
GLUCOSE: 126 mg/dL — AB (ref 65–99)
POTASSIUM: 3.5 mmol/L (ref 3.5–5.1)
Sodium: 131 mmol/L — ABNORMAL LOW (ref 135–145)

## 2015-08-08 LAB — ALBUMIN: ALBUMIN: 3.2 g/dL — AB (ref 3.5–5.0)

## 2015-08-08 MED ORDER — TOPOTECAN HCL CHEMO INJECTION 4 MG
4.0000 mg/m2 | Freq: Once | INTRAVENOUS | Status: AC
  Administered 2015-08-08: 7.3 mg via INTRAVENOUS
  Filled 2015-08-08: qty 7.3

## 2015-08-08 MED ORDER — ZOLEDRONIC ACID 4 MG/5ML IV CONC
3.3000 mg | Freq: Once | INTRAVENOUS | Status: AC
  Administered 2015-08-08: 3.3 mg via INTRAVENOUS
  Filled 2015-08-08: qty 4.13

## 2015-08-08 MED ORDER — SODIUM CHLORIDE 0.9 % IV SOLN
Freq: Once | INTRAVENOUS | Status: AC
  Administered 2015-08-08: 11:00:00 via INTRAVENOUS
  Filled 2015-08-08: qty 1000

## 2015-08-08 MED ORDER — LEVOFLOXACIN 500 MG PO TABS: 500.0000 mg | ORAL_TABLET | Freq: Every day | ORAL | Status: DC

## 2015-08-08 MED ORDER — PROCHLORPERAZINE MALEATE 10 MG PO TABS
10.0000 mg | ORAL_TABLET | Freq: Once | ORAL | Status: AC
  Administered 2015-08-08: 10 mg via ORAL
  Filled 2015-08-08: qty 1

## 2015-08-08 NOTE — Progress Notes (Signed)
States has intermittent pain in right lower abdomen with occasional burning with urination and urinary frequency.

## 2015-08-10 LAB — URINE CULTURE: Culture: >=100000 — AB

## 2015-08-13 NOTE — Progress Notes (Signed)
[place]  Telephone:(336) [phone] Fax:(336) [phone]  ID: Madison Fry OB: 1928/10/18  MR#: [id number]  JJK#:[id number]  Patient Care Team: Mayra Neer, MD as PCP - General  CHIEF COMPLAINT: Stage IV small cell lung cancer with multiple bony lesions.   INTERVAL HISTORY: Patient returns to clinic today for further evaluation and consideration of cycle 2, day 1 of single agent topotecan. She has noticed some mild intermittent lower abdominal pain with occasional burning with urination and increased frequency. She does not complain of weakness or fatigue today. She denies any fevers. She has no neurologic complaints. She denies any chest pain, shortness of breath, cough or hemoptysis. She denies any pain today. She denies any nausea, vomiting, constipation, or diarrhea. Patient offers no further specific complaints today.   REVIEW OF SYSTEMS:   Review of Systems  Constitutional: Negative for fever, weight loss and malaise/fatigue.  Respiratory: Positive for shortness of breath. Negative for cough, hemoptysis and wheezing.   Cardiovascular: Negative.  Negative for chest pain.  Gastrointestinal: Negative for nausea, vomiting and diarrhea.  Genitourinary: Positive for dysuria, frequency and flank pain. Negative for hematuria.  Musculoskeletal: Negative.   Neurological: Negative for weakness.  Psychiatric/Behavioral: The patient is not nervous/anxious.     As per HPI. Otherwise, a complete review of systems is negatve.  PAST MEDICAL HISTORY: Past Medical History  Diagnosis Date  . Diabetes mellitus without complication ([place])   . Hypertension   . Cancer ([place])     breast cancer, Right side lymph nodes removed    PAST SURGICAL HISTORY: Right colectomy for benign disease, right lumpectomy.  FAMILY HISTORY: Reviewed and unchanged. No reported history of malignancy or chronic disease.     ADVANCED DIRECTIVES:    HEALTH MAINTENANCE: Social History    Substance Use Topics  . Smoking status: Former Smoker -- 1.50 packs/day    Types: Cigarettes    Quit date: 08/27/1990  . Smokeless tobacco: Never Used  . Alcohol Use: No     Allergies  Allergen Reactions  . Shellfish Allergy Swelling    Current Outpatient Prescriptions  Medication Sig Dispense Refill  . albuterol (PROVENTIL) (2.5 MG/3ML) 0.083% nebulizer solution Take 3 mLs (2.5 mg total) by nebulization 3 (three) times daily. 252 mL 3  . colesevelam (WELCHOL) 625 MG tablet Take by mouth 2 (two) times daily with a meal.    . COMBIGAN 0.2-0.5 % ophthalmic solution PUT 1 DROP IN AFFECTED EYE EVERY 12 HOURS**NEED OFFICE VISIT**  0  . ezetimibe (ZETIA) 10 MG tablet Take 10 mg by mouth daily.    Marland Kitchen glimepiride (AMARYL) 2 MG tablet Take 2 mg by mouth daily with breakfast.    . HYDROcodone-acetaminophen (NORCO/VICODIN) 5-325 MG tablet TAKE 1-2 TABLETS BY MOUTH EVERY 8 HOURS AS NEEDED FOR PAIN  0  . levothyroxine (SYNTHROID, LEVOTHROID) 50 MCG tablet Take 50 mcg by mouth daily.    Marland Kitchen LORazepam (ATIVAN) 0.5 MG tablet     . losartan (COZAAR) 100 MG tablet Take 100 mg by mouth daily.    . metFORMIN (GLUCOPHAGE-XR) 500 MG 24 hr tablet TAKE 1 TABLET BY MOUTH WITH MEALS  2  . TRAVATAN Z 0.004 % SOLN ophthalmic solution PUT 1 DROP INTO BOTH EYES AT BEDTIME  0  . verapamil (CALAN-SR) 120 MG CR tablet Take 120 mg by mouth daily.    Marland Kitchen zolpidem (AMBIEN) 5 MG tablet Take 1 tablet (5 mg total) by mouth at bedtime as needed for sleep. 30 tablet 0  .  levofloxacin (LEVAQUIN) 500 MG tablet Take 1 tablet (500 mg total) by mouth daily. 7 tablet 0   No current facility-administered medications for this visit.    OBJECTIVE: Filed Vitals:   08/08/15 0952  BP: 118/69  Pulse: 103  Temp: 98.2 F (36.8 C)  Resp: 18     Body mass index is 26.7 kg/(m^2).    ECOG FS:1 - Symptomatic but completely ambulatory  General: Well-developed, well-nourished, no acute distress. Eyes: Pink conjunctiva, anicteric  sclera. Lungs: Scattered wheezing throughout. Heart: Regular rate and rhythm. No rubs, murmurs, or gallops. Abdomen: Soft, nontender, nondistended. No organomegaly noted, normoactive bowel sounds. Musculoskeletal: No edema, cyanosis, or clubbing. Neuro: Alert, answering all questions appropriately. Cranial nerves grossly intact. Skin: No rashes or petechiae noted. Psych: Normal affect.  LAB RESULTS:  Lab Results  Component Value Date   NA 131* 08/08/2015   K 3.5 08/08/2015   CL 102 08/08/2015   CO2 26 08/08/2015   GLUCOSE 126* 08/08/2015   BUN 15 08/08/2015   CREATININE 0.49 08/08/2015   CALCIUM 7.8* 08/08/2015   PROT 7.0 05/02/2015   ALBUMIN 3.2* 08/08/2015   AST 19 05/02/2015   ALT 9* 05/02/2015   ALKPHOS 71 05/02/2015   BILITOT 0.4 05/02/2015   GFRNONAA >60 08/08/2015   GFRAA >60 08/08/2015    Lab Results  Component Value Date   WBC 3.9 08/08/2015   NEUTROABS 2.7 08/08/2015   HGB 10.3* 08/08/2015   HCT 29.4* 08/08/2015   MCV 90.8 08/08/2015   PLT 395 08/08/2015     STUDIES: No results found.  ASSESSMENT: Stage Ia adenocarcinoma of the right breast, now with stage IV small cell lung cancer with multiple bony lesions.   PLAN:    1. Small cell lung cancer, stage IV with metastasis to the bone: Restaging CT scan from July 12, 2015 reviewed independently with significant response to XRT with her thoracic mass. Patient continues to have persistent metastatic disease. MRI the brain did not reveal any intracranial abnormality. Proceed with cycle 2, day 1 of single agent topotecan secondary to thrombocytopenia. Patient will also receive Zometa today. Return to clinic in 1 week for consideration of cycle 2, day 8. Patient may only be able to tolerate treatment on days 1 and 8. 2. Breast cancer: Patient completed 5 years of tamoxifen, no further workup or intervention is needed at this time. 3. Osteoporosis: Continue calcium and vitamin D supplementation. Patient will also  be receiving Zometa for her bony metastasis. 4. Hyponatremia: Mild. Likely secondary to underlying malignancy, monitor. 5. Bony metastasis: Zometa as above. 6. Hypokalemia: Resolved. Continue oral supplementation as prescribed.  7. Thrombocytopenia: Resolved. 8. Leukopenia: Resolved.   Patient expressed understanding and was in agreement with this plan. She also understands that She can call clinic at any time with any questions, concerns, or complaints.   Lloyd Huger, MD   08/13/2015 8:20 AM

## 2015-08-15 ENCOUNTER — Inpatient Hospital Stay: Payer: Commercial Managed Care - HMO

## 2015-08-15 ENCOUNTER — Inpatient Hospital Stay (HOSPITAL_BASED_OUTPATIENT_CLINIC_OR_DEPARTMENT_OTHER): Payer: Commercial Managed Care - HMO | Admitting: Hematology and Oncology

## 2015-08-15 ENCOUNTER — Encounter: Payer: Self-pay | Admitting: Hematology and Oncology

## 2015-08-15 VITALS — BP 102/54 | HR 96 | Temp 99.1°F | Resp 18 | Wt 162.0 lb

## 2015-08-15 DIAGNOSIS — N281 Cyst of kidney, acquired: Secondary | ICD-10-CM

## 2015-08-15 DIAGNOSIS — D72819 Decreased white blood cell count, unspecified: Secondary | ICD-10-CM

## 2015-08-15 DIAGNOSIS — M818 Other osteoporosis without current pathological fracture: Secondary | ICD-10-CM

## 2015-08-15 DIAGNOSIS — E860 Dehydration: Secondary | ICD-10-CM

## 2015-08-15 DIAGNOSIS — C3411 Malignant neoplasm of upper lobe, right bronchus or lung: Secondary | ICD-10-CM | POA: Diagnosis not present

## 2015-08-15 DIAGNOSIS — R109 Unspecified abdominal pain: Secondary | ICD-10-CM

## 2015-08-15 DIAGNOSIS — I10 Essential (primary) hypertension: Secondary | ICD-10-CM

## 2015-08-15 DIAGNOSIS — R110 Nausea: Secondary | ICD-10-CM

## 2015-08-15 DIAGNOSIS — Z79899 Other long term (current) drug therapy: Secondary | ICD-10-CM

## 2015-08-15 DIAGNOSIS — E871 Hypo-osmolality and hyponatremia: Secondary | ICD-10-CM

## 2015-08-15 DIAGNOSIS — Z5111 Encounter for antineoplastic chemotherapy: Secondary | ICD-10-CM | POA: Diagnosis not present

## 2015-08-15 DIAGNOSIS — R634 Abnormal weight loss: Secondary | ICD-10-CM

## 2015-08-15 DIAGNOSIS — C7951 Secondary malignant neoplasm of bone: Secondary | ICD-10-CM

## 2015-08-15 DIAGNOSIS — R531 Weakness: Secondary | ICD-10-CM

## 2015-08-15 DIAGNOSIS — M160 Bilateral primary osteoarthritis of hip: Secondary | ICD-10-CM

## 2015-08-15 DIAGNOSIS — R300 Dysuria: Secondary | ICD-10-CM

## 2015-08-15 DIAGNOSIS — Z87891 Personal history of nicotine dependence: Secondary | ICD-10-CM

## 2015-08-15 DIAGNOSIS — D696 Thrombocytopenia, unspecified: Secondary | ICD-10-CM

## 2015-08-15 DIAGNOSIS — J90 Pleural effusion, not elsewhere classified: Secondary | ICD-10-CM

## 2015-08-15 DIAGNOSIS — R5383 Other fatigue: Secondary | ICD-10-CM

## 2015-08-15 DIAGNOSIS — I2510 Atherosclerotic heart disease of native coronary artery without angina pectoris: Secondary | ICD-10-CM

## 2015-08-15 DIAGNOSIS — C3491 Malignant neoplasm of unspecified part of right bronchus or lung: Secondary | ICD-10-CM

## 2015-08-15 DIAGNOSIS — R590 Localized enlarged lymph nodes: Secondary | ICD-10-CM

## 2015-08-15 DIAGNOSIS — Z853 Personal history of malignant neoplasm of breast: Secondary | ICD-10-CM

## 2015-08-15 DIAGNOSIS — E119 Type 2 diabetes mellitus without complications: Secondary | ICD-10-CM

## 2015-08-15 DIAGNOSIS — Z7984 Long term (current) use of oral hypoglycemic drugs: Secondary | ICD-10-CM

## 2015-08-15 LAB — CBC WITH DIFFERENTIAL/PLATELET
BASOS ABS: 0 10*3/uL (ref 0–0.1)
BASOS PCT: 1 %
EOS ABS: 0 10*3/uL (ref 0–0.7)
EOS PCT: 1 %
HCT: 27.5 % — ABNORMAL LOW (ref 35.0–47.0)
HEMOGLOBIN: 9.4 g/dL — AB (ref 12.0–16.0)
LYMPHS ABS: 0.5 10*3/uL — AB (ref 1.0–3.6)
Lymphocytes Relative: 14 %
MCH: 31.2 pg (ref 26.0–34.0)
MCHC: 34.3 g/dL (ref 32.0–36.0)
MCV: 91 fL (ref 80.0–100.0)
Monocytes Absolute: 0.3 10*3/uL (ref 0.2–0.9)
Monocytes Relative: 10 %
NEUTROS PCT: 74 %
Neutro Abs: 2.5 10*3/uL (ref 1.4–6.5)
PLATELETS: 279 10*3/uL (ref 150–440)
RBC: 3.03 MIL/uL — AB (ref 3.80–5.20)
RDW: 17.7 % — ABNORMAL HIGH (ref 11.5–14.5)
WBC: 3.4 10*3/uL — AB (ref 3.6–11.0)

## 2015-08-15 LAB — BASIC METABOLIC PANEL
ANION GAP: 5 (ref 5–15)
BUN: 13 mg/dL (ref 6–20)
CHLORIDE: 101 mmol/L (ref 101–111)
CO2: 24 mmol/L (ref 22–32)
Calcium: 7.9 mg/dL — ABNORMAL LOW (ref 8.9–10.3)
Creatinine, Ser: 0.75 mg/dL (ref 0.44–1.00)
Glucose, Bld: 116 mg/dL — ABNORMAL HIGH (ref 65–99)
POTASSIUM: 3.5 mmol/L (ref 3.5–5.1)
SODIUM: 130 mmol/L — AB (ref 135–145)

## 2015-08-15 MED ORDER — SODIUM CHLORIDE 0.9 % IV SOLN
INTRAVENOUS | Status: AC
  Filled 2015-08-15: qty 1000

## 2015-08-15 NOTE — Progress Notes (Signed)
Patient is here for follow up, sees Dr. Grayland Ormond. Had lab work done this morning and printed off last office visit. She was sick yesterday with chills, nausea and vomiting. She has a low grade fever today not sure if she wants to do treatment or not.

## 2015-08-15 NOTE — Progress Notes (Signed)
[place]  [phone] [phone]) [phone]  ID: Madison Fry OB: 05/05/1928  MR#: [id number]  XFG#:[id number]  Patient Care Team: Mayra Neer, MD as PCP - General  CHIEF COMPLAINT: Stage IV small cell lung cancer with multiple bony lesions.   INTERVAL HISTORY: Patient returns to clinic today for further evaluation and consideration of cycle 2, day 8 of single agent topotecan. She describes being weak and tired.  She is still able to perform her ADLs.  Her appetite has been poor.  She is taking little bites of food.  She notes recently completing a course of Levaquin for a UTI.    She denies any chest pain, shortness of breath, cough or hemoptysis. She denies any pain. She denies any nausea, vomiting, constipation, or diarrhea.   REVIEW OF SYSTEMS:   Review of Systems  Constitutional: Positive for fever. Negative for chills, weight loss, malaise/fatigue and diaphoresis.  HENT: Negative for congestion, ear discharge, ear pain, nosebleeds and sore throat.   Eyes: Negative for blurred vision, double vision, photophobia, pain, discharge and redness.  Respiratory: Positive for shortness of breath. Negative for cough, hemoptysis, sputum production, wheezing and stridor.   Cardiovascular: Negative.  Negative for chest pain and palpitations.  Gastrointestinal: Negative for heartburn, nausea, vomiting, abdominal pain, diarrhea, constipation, blood in stool and melena.  Genitourinary: Negative for dysuria, urgency, frequency, hematuria and flank pain.  Musculoskeletal: Negative.  Negative for myalgias, back pain, joint pain, falls and neck pain.  Skin: Negative for itching and rash.  Neurological: Positive for dizziness. Negative for tingling, tremors, sensory change, focal weakness, seizures, weakness and headaches.  Endo/Heme/Allergies: Does not bruise/bleed easily.  Psychiatric/Behavioral: Negative for depression. The patient is not nervous/anxious.      As per HPI. Otherwise, a complete review of systems is negatve.  PAST MEDICAL HISTORY: Past Medical History  Diagnosis Date  . Diabetes mellitus without complication ([place])   . Hypertension   . Cancer ([place])     breast cancer, Right side lymph nodes removed    PAST SURGICAL HISTORY: Right colectomy for benign disease, right lumpectomy.  FAMILY HISTORY: Reviewed and unchanged. No reported history of malignancy or chronic disease.     ADVANCED DIRECTIVES:    HEALTH MAINTENANCE: Social History  Substance Use Topics  . Smoking status: Former Smoker -- 1.50 packs/day    Types: Cigarettes    Quit date: 08/27/1990  . Smokeless tobacco: Never Used  . Alcohol Use: No     Allergies  Allergen Reactions  . Shellfish Allergy Swelling    Current Outpatient Prescriptions  Medication Sig Dispense Refill  . albuterol (PROVENTIL) (2.5 MG/3ML) 0.083% nebulizer solution Take 3 mLs (2.5 mg total) by nebulization 3 (three) times daily. 252 mL 3  . colesevelam (WELCHOL) 625 MG tablet Take by mouth 2 (two) times daily with a meal.    . COMBIGAN 0.2-0.5 % ophthalmic solution PUT 1 DROP IN AFFECTED EYE EVERY 12 HOURS**NEED OFFICE VISIT**  0  . ezetimibe (ZETIA) 10 MG tablet Take 10 mg by mouth daily.    Marland Kitchen glimepiride (AMARYL) 2 MG tablet Take 2 mg by mouth daily with breakfast.    . HYDROcodone-acetaminophen (NORCO/VICODIN) 5-325 MG tablet TAKE 1-2 TABLETS BY MOUTH EVERY 8 HOURS AS NEEDED FOR PAIN  0  . levofloxacin (LEVAQUIN) 500 MG tablet Take 1 tablet (500 mg total) by mouth daily. 7 tablet 0  . levothyroxine (SYNTHROID, LEVOTHROID) 50 MCG tablet Take 50 mcg by mouth daily.    Marland Kitchen LORazepam (  ATIVAN) 0.5 MG tablet     . losartan (COZAAR) 100 MG tablet Take 100 mg by mouth daily.    . metFORMIN (GLUCOPHAGE-XR) 500 MG 24 hr tablet TAKE 1 TABLET BY MOUTH WITH MEALS  2  . TRAVATAN Z 0.004 % SOLN ophthalmic solution PUT 1 DROP INTO BOTH EYES AT BEDTIME  0  . verapamil (CALAN-SR) 120 MG CR  tablet Take 120 mg by mouth daily.    Marland Kitchen zolpidem (AMBIEN) 5 MG tablet Take 1 tablet (5 mg total) by mouth at bedtime as needed for sleep. 30 tablet 0   No current facility-administered medications for this visit.    OBJECTIVE: Filed Vitals:   08/15/15 1107  BP: 102/54  Pulse: 96  Temp: 99.1 F (37.3 C)  Resp: 18     Body mass index is 27.39 kg/(m^2).    ECOG FS:1 - Symptomatic but completely ambulatory  General: Well-developed, well-nourished, woman sitting comfortably in a wheelchair no acute distress. Eyes: pupils equal round and reactive to light and accomodation.  Pink conjunctiva, anicteric sclera. ENT:  No oral lesions. Lungs: Lungs clear to auscultation without rales, wheezes or rhonchi. Heart: Regular rate and rhythm. No rubs, murmurs, or gallops. Abdomen: Soft, nontender, nondistended. Active bowel sounds.  No hepatospleomegaly. Musculoskeletal: No edema, cyanosis, or clubbing. Neuro: Alert, answering all questions appropriately. Cranial nerves grossly intact. Skin: No rashes or petechiae noted. Extremities;  No edema.  No palpable cords. Neuro:  Appropriate. Psych: Normal affect.  LAB RESULTS:  Lab Results  Component Value Date   NA 130* 08/15/2015   K 3.5 08/15/2015   CL 101 08/15/2015   CO2 24 08/15/2015   GLUCOSE 116* 08/15/2015   BUN 13 08/15/2015   CREATININE 0.75 08/15/2015   CALCIUM 7.9* 08/15/2015   PROT 7.0 05/02/2015   ALBUMIN 3.2* 08/08/2015   AST 19 05/02/2015   ALT 9* 05/02/2015   ALKPHOS 71 05/02/2015   BILITOT 0.4 05/02/2015   GFRNONAA >60 08/15/2015   GFRAA >60 08/15/2015    Lab Results  Component Value Date   WBC 3.4* 08/15/2015   NEUTROABS 2.5 08/15/2015   HGB 9.4* 08/15/2015   HCT 27.5* 08/15/2015   MCV 91.0 08/15/2015   PLT 279 08/15/2015     STUDIES: No results found.  ASSESSMENT: Stage Ia adenocarcinoma of the right breast, now with stage IV small cell lung cancer with multiple bony lesions.   PLAN:    1. Small cell  lung cancer, stage IV with metastasis to the bone: Restaging CT scan from July 12, 2015 revealed a significant response to XRT with her thoracic mass. Patient continues to have persistent metastatic disease. MRI the brain did not reveal any intracranial abnormality.   Cycle #2 topotecan started 08/08/2015.  Counts adequate today.  Patient wishes to postpone treatment today (truncated cycle #2) and begin cycle #3 next week.  Patient received Zometa on 08/08/2015.  Return to clinic in 1 week for consideration of cycle #3, day 1.   2. Breast cancer: Patient completed 5 years of tamoxifen, no further workup or intervention is needed at this time.  3. Osteoporosis: Continue calcium and vitamin D supplementation. Patient receives Zometa for her bone metastasis.  4. Cardiovascular:  Patient orthostatic and slightly dehydrated today.  Will give IVF.  5. Hyponatremia: Mild. Likely secondary to underlying malignancy, monitor.  Discuss fluids with electrolytes and watching free water intake.  Discuss caloric intake.  6. Bony metastasis: Zometa as above.  7. Hypokalemia: Resolved. Continue oral supplementation as  prescribed.   8:  Infectious disease:  Low grade fever of unclear origin.  Recent completion of Levaquin for UTI.  Patient declines repeat UA and culture.  Patient advised to monitor temperature.  If increases with systemic symptoms, she is to contact the office for re-evaluation.   Patient expressed understanding and was in agreement with this plan. She also understands that She can call clinic at any time with any questions, concerns, or complaints.   Madison Asal, MD   08/15/2015 11:20 AM

## 2015-08-19 ENCOUNTER — Inpatient Hospital Stay (HOSPITAL_BASED_OUTPATIENT_CLINIC_OR_DEPARTMENT_OTHER): Payer: Commercial Managed Care - HMO | Admitting: Oncology

## 2015-08-19 ENCOUNTER — Other Ambulatory Visit: Payer: Self-pay | Admitting: *Deleted

## 2015-08-19 ENCOUNTER — Inpatient Hospital Stay: Payer: Commercial Managed Care - HMO

## 2015-08-19 VITALS — BP 135/74 | HR 99 | Temp 98.1°F | Resp 18

## 2015-08-19 DIAGNOSIS — N390 Urinary tract infection, site not specified: Secondary | ICD-10-CM

## 2015-08-19 DIAGNOSIS — R590 Localized enlarged lymph nodes: Secondary | ICD-10-CM

## 2015-08-19 DIAGNOSIS — C7951 Secondary malignant neoplasm of bone: Secondary | ICD-10-CM

## 2015-08-19 DIAGNOSIS — I10 Essential (primary) hypertension: Secondary | ICD-10-CM

## 2015-08-19 DIAGNOSIS — M818 Other osteoporosis without current pathological fracture: Secondary | ICD-10-CM

## 2015-08-19 DIAGNOSIS — Z7984 Long term (current) use of oral hypoglycemic drugs: Secondary | ICD-10-CM

## 2015-08-19 DIAGNOSIS — R110 Nausea: Secondary | ICD-10-CM

## 2015-08-19 DIAGNOSIS — R531 Weakness: Secondary | ICD-10-CM

## 2015-08-19 DIAGNOSIS — M160 Bilateral primary osteoarthritis of hip: Secondary | ICD-10-CM

## 2015-08-19 DIAGNOSIS — E871 Hypo-osmolality and hyponatremia: Secondary | ICD-10-CM

## 2015-08-19 DIAGNOSIS — Z87891 Personal history of nicotine dependence: Secondary | ICD-10-CM

## 2015-08-19 DIAGNOSIS — I2510 Atherosclerotic heart disease of native coronary artery without angina pectoris: Secondary | ICD-10-CM

## 2015-08-19 DIAGNOSIS — N281 Cyst of kidney, acquired: Secondary | ICD-10-CM

## 2015-08-19 DIAGNOSIS — Z853 Personal history of malignant neoplasm of breast: Secondary | ICD-10-CM

## 2015-08-19 DIAGNOSIS — C3491 Malignant neoplasm of unspecified part of right bronchus or lung: Secondary | ICD-10-CM

## 2015-08-19 DIAGNOSIS — Z79899 Other long term (current) drug therapy: Secondary | ICD-10-CM

## 2015-08-19 DIAGNOSIS — J90 Pleural effusion, not elsewhere classified: Secondary | ICD-10-CM

## 2015-08-19 DIAGNOSIS — C3411 Malignant neoplasm of upper lobe, right bronchus or lung: Secondary | ICD-10-CM

## 2015-08-19 DIAGNOSIS — R5383 Other fatigue: Secondary | ICD-10-CM

## 2015-08-19 DIAGNOSIS — Z5111 Encounter for antineoplastic chemotherapy: Secondary | ICD-10-CM | POA: Diagnosis not present

## 2015-08-19 DIAGNOSIS — E860 Dehydration: Secondary | ICD-10-CM

## 2015-08-19 DIAGNOSIS — R634 Abnormal weight loss: Secondary | ICD-10-CM

## 2015-08-19 DIAGNOSIS — E119 Type 2 diabetes mellitus without complications: Secondary | ICD-10-CM

## 2015-08-19 LAB — BASIC METABOLIC PANEL
Anion gap: 5 (ref 5–15)
BUN: 13 mg/dL (ref 6–20)
CALCIUM: 8.4 mg/dL — AB (ref 8.9–10.3)
CO2: 25 mmol/L (ref 22–32)
CREATININE: 0.66 mg/dL (ref 0.44–1.00)
Chloride: 100 mmol/L — ABNORMAL LOW (ref 101–111)
GFR calc Af Amer: >60 mL/min (ref >60)
GLUCOSE: 107 mg/dL — AB (ref 65–99)
Potassium: 3.7 mmol/L (ref 3.5–5.1)
SODIUM: 130 mmol/L — AB (ref 135–145)

## 2015-08-19 LAB — URINALYSIS COMPLETE WITH MICROSCOPIC (ARMC ONLY)
BILIRUBIN URINE: NEGATIVE
GLUCOSE, UA: NEGATIVE mg/dL
Hgb urine dipstick: NEGATIVE
KETONES UR: NEGATIVE mg/dL
Nitrite: NEGATIVE
PH: 6 (ref 5.0–8.0)
Protein, ur: NEGATIVE mg/dL
Specific Gravity, Urine: 1.018 (ref 1.005–1.030)

## 2015-08-19 LAB — CBC WITH DIFFERENTIAL/PLATELET
BASOS ABS: 0 10*3/uL (ref 0–0.1)
Basophils Relative: 0 %
EOS ABS: 0 10*3/uL (ref 0–0.7)
EOS PCT: 0 %
HCT: 29.6 % — ABNORMAL LOW (ref 35.0–47.0)
Hemoglobin: 9.8 g/dL — ABNORMAL LOW (ref 12.0–16.0)
LYMPHS ABS: 0.6 10*3/uL — AB (ref 1.0–3.6)
LYMPHS PCT: 12 %
MCH: 30.1 pg (ref 26.0–34.0)
MCHC: 32.9 g/dL (ref 32.0–36.0)
MCV: 91.3 fL (ref 80.0–100.0)
MONO ABS: 0.5 10*3/uL (ref 0.2–0.9)
Monocytes Relative: 9 %
Neutro Abs: 4.1 10*3/uL (ref 1.4–6.5)
Neutrophils Relative %: 79 %
PLATELETS: 253 10*3/uL (ref 150–440)
RBC: 3.24 MIL/uL — AB (ref 3.80–5.20)
RDW: 18.2 % — AB (ref 11.5–14.5)
WBC: 5.2 10*3/uL (ref 3.6–11.0)

## 2015-08-19 MED ORDER — SODIUM CHLORIDE 0.9 % IV SOLN
Freq: Once | INTRAVENOUS | Status: DC
  Filled 2015-08-19: qty 1000

## 2015-08-19 MED ORDER — SODIUM CHLORIDE 0.9 % IV SOLN
Freq: Once | INTRAVENOUS | Status: AC
  Administered 2015-08-19: 12:00:00 via INTRAVENOUS
  Filled 2015-08-19: qty 1000

## 2015-08-19 NOTE — Progress Notes (Signed)
States has felt nauseated over the weekend. Drinking fluids well but unable to eat solid food due to nausea. Finished recent antibiotics and pain in right lower abdomen has returned. Pt concerned that is getting another UTI and would like urine checked.

## 2015-08-19 NOTE — Progress Notes (Signed)
[place]  Telephone:([phone] Fax:(336) [phone]  ID: Madison Fry OB: 1928/11/07  MR#: [id number]  FWY#:[id number]  Patient Care Team: Mayra Neer, MD as PCP - General  CHIEF COMPLAINT: Stage IV small cell lung cancer with multiple bony lesions.   INTERVAL HISTORY: Patient returns to clinic today for further evaluation. She feels improved from last week, but still admits to increased weakness and fatigue. She continues to have a poor appetite. She several recurrence of her right flank pain similar to when she had a UTI recently. She has no neurologic complaints. She denies any recent fevers or illnesses. She denies any chest pain or shortness of breath. She denies any nausea, vomiting, constipation, or diarrhea. Patient otherwise feels well and offers no further specific complaints.   REVIEW OF SYSTEMS:   Review of Systems  Constitutional: Positive for malaise/fatigue. Negative for fever and weight loss.  Respiratory: Negative.  Negative for cough and shortness of breath.   Cardiovascular: Negative.  Negative for chest pain.  Gastrointestinal: Negative.   Genitourinary: Positive for flank pain.  Neurological: Positive for weakness.  Psychiatric/Behavioral: Negative.     As per HPI. Otherwise, a complete review of systems is negatve.  PAST MEDICAL HISTORY: Past Medical History:  Diagnosis Date  . Cancer [facility])    breast cancer, Right side lymph nodes removed  . Diabetes mellitus without complication ([place])   . Hypertension     PAST SURGICAL HISTORY: Right colectomy for benign disease, right lumpectomy.  FAMILY HISTORY: Reviewed and unchanged. No reported history of malignancy or chronic disease.     ADVANCED DIRECTIVES:    HEALTH MAINTENANCE: Social History  Substance Use Topics  . Smoking status: Former Smoker    Packs/day: 1.50    Types: Cigarettes    Quit date: 08/27/1990  . Smokeless tobacco: Never Used  . Alcohol use No      Allergies  Allergen Reactions  . Shellfish Allergy Swelling    Current Outpatient Prescriptions  Medication Sig Dispense Refill  . albuterol (PROVENTIL) (2.5 MG/3ML) 0.083% nebulizer solution Take 3 mLs (2.5 mg total) by nebulization 3 (three) times daily. 252 mL 3  . colesevelam (WELCHOL) 625 MG tablet Take by mouth 2 (two) times daily with a meal.    . COMBIGAN 0.2-0.5 % ophthalmic solution PUT 1 DROP IN AFFECTED EYE EVERY 12 HOURS**NEED OFFICE VISIT**  0  . ezetimibe (ZETIA) 10 MG tablet Take 10 mg by mouth daily.    Marland Kitchen glimepiride (AMARYL) 2 MG tablet Take 2 mg by mouth daily with breakfast.    . HYDROcodone-acetaminophen (NORCO/VICODIN) 5-325 MG tablet TAKE 1-2 TABLETS BY MOUTH EVERY 8 HOURS AS NEEDED FOR PAIN  0  . levothyroxine (SYNTHROID, LEVOTHROID) 50 MCG tablet Take 50 mcg by mouth daily.    Marland Kitchen LORazepam (ATIVAN) 0.5 MG tablet     . losartan (COZAAR) 100 MG tablet Take 100 mg by mouth daily.    . metFORMIN (GLUCOPHAGE-XR) 500 MG 24 hr tablet TAKE 1 TABLET BY MOUTH WITH MEALS  2  . TRAVATAN Z 0.004 % SOLN ophthalmic solution PUT 1 DROP INTO BOTH EYES AT BEDTIME  0  . verapamil (CALAN-SR) 120 MG CR tablet Take 120 mg by mouth daily.    Marland Kitchen zolpidem (AMBIEN) 5 MG tablet Take 1 tablet (5 mg total) by mouth at bedtime as needed for sleep. 30 tablet 0   No current facility-administered medications for this visit.    Facility-Administered Medications Ordered in Other Visits  Medication Dose  Route Frequency Provider Last Rate Last Dose  . 0.9 %  sodium chloride infusion   Intravenous Continuous Lequita Asal, MD        OBJECTIVE: Vitals:   08/19/15 1011  BP: 135/74  Pulse: 99  Resp: 18  Temp: 98.1 F (36.7 C)     There is no height or weight on file to calculate BMI.    ECOG FS:1 - Symptomatic but completely ambulatory  General: Well-developed, well-nourished, woman sitting comfortably in a wheelchair no acute distress. Lungs: Lungs clear to auscultation without  rales, wheezes or rhonchi. Heart: Regular rate and rhythm. No rubs, murmurs, or gallops. Abdomen: Soft, nontender, nondistended. Active bowel sounds.  No hepatospleomegaly. Musculoskeletal: No edema, cyanosis, or clubbing. Neuro: Alert, answering all questions appropriately. Cranial nerves grossly intact. Skin: No rashes or petechiae noted. Extremities;  No edema. Neuro:  Appropriate. Psych: Normal affect.  LAB RESULTS:  Lab Results  Component Value Date   NA 130 (L) 08/19/2015   K 3.7 08/19/2015   CL 100 (L) 08/19/2015   CO2 25 08/19/2015   GLUCOSE 107 (H) 08/19/2015   BUN 13 08/19/2015   CREATININE 0.66 08/19/2015   CALCIUM 8.4 (L) 08/19/2015   PROT 7.0 05/02/2015   ALBUMIN 3.2 (L) 08/08/2015   AST 19 05/02/2015   ALT 9 (L) 05/02/2015   ALKPHOS 71 05/02/2015   BILITOT 0.4 05/02/2015   GFRNONAA >60 08/19/2015   GFRAA >60 08/19/2015    Lab Results  Component Value Date   WBC 5.2 08/19/2015   NEUTROABS 4.1 08/19/2015   HGB 9.8 (L) 08/19/2015   HCT 29.6 (L) 08/19/2015   MCV 91.3 08/19/2015   PLT 253 08/19/2015     STUDIES: No results found.  ASSESSMENT: Stage Ia adenocarcinoma of the right breast, now with stage IV small cell lung cancer with multiple bony lesions.   PLAN:    1. Small cell lung cancer, stage IV with metastasis to the bone: Restaging CT scan from July 12, 2015 revealed a significant response to XRT with her thoracic mass. Patient continues to have persistent metastatic disease. MRI the brain did not reveal any intracranial abnormality. Patient delayed cycle 2, day 8 of single agent topotecan last week secondary to decreased performance status and increased weakness and fatigue. She has been instructed to keep her follow-up appointment on Thursday to discuss reinitiation treatment. Patient last received Zometa on August 08, 2015. 2. Breast cancer: Patient completed 5 years of tamoxifen, no further workup or intervention is needed at this time. 3.  Osteoporosis: Continue calcium and vitamin D supplementation. Patient receives Zometa for her bone metastasis. 4. Hyponatremia: Mild. Likely secondary to underlying malignancy, monitor.  Discuss fluids with electrolytes and watching free water intake.  Discuss caloric intake. 5. Hypokalemia: Resolved. Continue oral supplementation as prescribed.  6. UTI/flank pain: Patient continues to have persistent gram-negative rods in her urine. She may need an extended course of antibiotics.   Patient expressed understanding and was in agreement with this plan. She also understands that She can call clinic at any time with any questions, concerns, or complaints.   Lloyd Huger, MD   08/19/2015 11:02 AM

## 2015-08-21 LAB — URINE CULTURE: Culture: >=100000 — AB

## 2015-08-22 ENCOUNTER — Inpatient Hospital Stay: Payer: Commercial Managed Care - HMO

## 2015-08-22 ENCOUNTER — Inpatient Hospital Stay (HOSPITAL_BASED_OUTPATIENT_CLINIC_OR_DEPARTMENT_OTHER): Payer: Commercial Managed Care - HMO | Admitting: Oncology

## 2015-08-22 VITALS — BP 120/70 | HR 92 | Temp 95.3°F | Resp 18 | Wt 140.1 lb

## 2015-08-22 DIAGNOSIS — C3411 Malignant neoplasm of upper lobe, right bronchus or lung: Secondary | ICD-10-CM | POA: Diagnosis not present

## 2015-08-22 DIAGNOSIS — R300 Dysuria: Secondary | ICD-10-CM

## 2015-08-22 DIAGNOSIS — Z79899 Other long term (current) drug therapy: Secondary | ICD-10-CM

## 2015-08-22 DIAGNOSIS — N390 Urinary tract infection, site not specified: Secondary | ICD-10-CM

## 2015-08-22 DIAGNOSIS — R531 Weakness: Secondary | ICD-10-CM

## 2015-08-22 DIAGNOSIS — C3491 Malignant neoplasm of unspecified part of right bronchus or lung: Secondary | ICD-10-CM

## 2015-08-22 DIAGNOSIS — R109 Unspecified abdominal pain: Secondary | ICD-10-CM

## 2015-08-22 DIAGNOSIS — R590 Localized enlarged lymph nodes: Secondary | ICD-10-CM

## 2015-08-22 DIAGNOSIS — M818 Other osteoporosis without current pathological fracture: Secondary | ICD-10-CM

## 2015-08-22 DIAGNOSIS — I2510 Atherosclerotic heart disease of native coronary artery without angina pectoris: Secondary | ICD-10-CM

## 2015-08-22 DIAGNOSIS — R5383 Other fatigue: Secondary | ICD-10-CM

## 2015-08-22 DIAGNOSIS — E119 Type 2 diabetes mellitus without complications: Secondary | ICD-10-CM

## 2015-08-22 DIAGNOSIS — Z87891 Personal history of nicotine dependence: Secondary | ICD-10-CM

## 2015-08-22 DIAGNOSIS — I10 Essential (primary) hypertension: Secondary | ICD-10-CM

## 2015-08-22 DIAGNOSIS — Z7984 Long term (current) use of oral hypoglycemic drugs: Secondary | ICD-10-CM

## 2015-08-22 DIAGNOSIS — D696 Thrombocytopenia, unspecified: Secondary | ICD-10-CM

## 2015-08-22 DIAGNOSIS — E781 Pure hyperglyceridemia: Secondary | ICD-10-CM | POA: Diagnosis not present

## 2015-08-22 DIAGNOSIS — C7951 Secondary malignant neoplasm of bone: Secondary | ICD-10-CM

## 2015-08-22 DIAGNOSIS — N281 Cyst of kidney, acquired: Secondary | ICD-10-CM

## 2015-08-22 DIAGNOSIS — Z853 Personal history of malignant neoplasm of breast: Secondary | ICD-10-CM

## 2015-08-22 DIAGNOSIS — J90 Pleural effusion, not elsewhere classified: Secondary | ICD-10-CM

## 2015-08-22 DIAGNOSIS — R634 Abnormal weight loss: Secondary | ICD-10-CM

## 2015-08-22 DIAGNOSIS — M160 Bilateral primary osteoarthritis of hip: Secondary | ICD-10-CM

## 2015-08-22 LAB — BASIC METABOLIC PANEL
ANION GAP: 5 (ref 5–15)
BUN: 9 mg/dL (ref 6–20)
CO2: 23 mmol/L (ref 22–32)
Calcium: 7.5 mg/dL — ABNORMAL LOW (ref 8.9–10.3)
Chloride: 102 mmol/L (ref 101–111)
Creatinine, Ser: 0.48 mg/dL (ref 0.44–1.00)
GFR calc Af Amer: >60 mL/min (ref >60)
Glucose, Bld: 94 mg/dL (ref 65–99)
POTASSIUM: 3.8 mmol/L (ref 3.5–5.1)
SODIUM: 130 mmol/L — AB (ref 135–145)

## 2015-08-22 LAB — CBC WITH DIFFERENTIAL/PLATELET
BASOS ABS: 0 10*3/uL (ref 0–0.1)
Basophils Relative: 0 %
EOS PCT: 1 %
Eosinophils Absolute: 0.1 10*3/uL (ref 0–0.7)
HCT: 28.2 % — ABNORMAL LOW (ref 35.0–47.0)
HEMOGLOBIN: 9.6 g/dL — AB (ref 12.0–16.0)
LYMPHS PCT: 8 %
Lymphs Abs: 0.6 10*3/uL — ABNORMAL LOW (ref 1.0–3.6)
MCH: 31 pg (ref 26.0–34.0)
MCHC: 34.1 g/dL (ref 32.0–36.0)
MCV: 91 fL (ref 80.0–100.0)
Monocytes Absolute: 0.5 10*3/uL (ref 0.2–0.9)
Monocytes Relative: 7 %
NEUTROS PCT: 84 %
Neutro Abs: 6 10*3/uL (ref 1.4–6.5)
PLATELETS: 413 10*3/uL (ref 150–440)
RBC: 3.1 MIL/uL — AB (ref 3.80–5.20)
RDW: 18.5 % — ABNORMAL HIGH (ref 11.5–14.5)
WBC: 7.2 10*3/uL (ref 3.6–11.0)

## 2015-08-22 MED ORDER — PROCHLORPERAZINE MALEATE 10 MG PO TABS
10.0000 mg | ORAL_TABLET | Freq: Once | ORAL | Status: AC
  Administered 2015-08-22: 10 mg via ORAL

## 2015-08-22 MED ORDER — SULFAMETHOXAZOLE-TRIMETHOPRIM 800-160 MG PO TABS: 1.0000 | ORAL_TABLET | Freq: Two times a day (BID) | ORAL | 0 refills | Status: DC

## 2015-08-22 MED ORDER — SODIUM CHLORIDE 0.9 % IV SOLN
Freq: Once | INTRAVENOUS | Status: AC
  Administered 2015-08-22: 15:00:00 via INTRAVENOUS
  Filled 2015-08-22: qty 1000

## 2015-08-22 MED ORDER — SODIUM CHLORIDE 0.9 % IV SOLN
4.0000 mg/m2 | Freq: Once | INTRAVENOUS | Status: AC
  Administered 2015-08-22: 7.3 mg via INTRAVENOUS
  Filled 2015-08-22: qty 7.3

## 2015-08-22 NOTE — Progress Notes (Signed)
States is feeling tired today and just wants to sleep.

## 2015-08-23 ENCOUNTER — Inpatient Hospital Stay
Admission: EM | Admit: 2015-08-23 | Discharge: 2015-08-26 | DRG: 690 | Disposition: A | Discharge status: Skilled Nursing Facility | Payer: Commercial Managed Care - HMO | Attending: Internal Medicine | Admitting: Internal Medicine

## 2015-08-23 ENCOUNTER — Encounter: Payer: Self-pay | Admitting: Emergency Medicine

## 2015-08-23 ENCOUNTER — Emergency Department: Payer: Commercial Managed Care - HMO

## 2015-08-23 ENCOUNTER — Other Ambulatory Visit: Payer: Self-pay

## 2015-08-23 DIAGNOSIS — Z7982 Long term (current) use of aspirin: Secondary | ICD-10-CM

## 2015-08-23 DIAGNOSIS — Z9221 Personal history of antineoplastic chemotherapy: Secondary | ICD-10-CM

## 2015-08-23 DIAGNOSIS — E039 Hypothyroidism, unspecified: Secondary | ICD-10-CM | POA: Diagnosis present

## 2015-08-23 DIAGNOSIS — R531 Weakness: Secondary | ICD-10-CM

## 2015-08-23 DIAGNOSIS — Z87440 Personal history of urinary (tract) infections: Secondary | ICD-10-CM

## 2015-08-23 DIAGNOSIS — N390 Urinary tract infection, site not specified: Principal | ICD-10-CM | POA: Diagnosis present

## 2015-08-23 DIAGNOSIS — C3490 Malignant neoplasm of unspecified part of unspecified bronchus or lung: Secondary | ICD-10-CM | POA: Diagnosis present

## 2015-08-23 DIAGNOSIS — C799 Secondary malignant neoplasm of unspecified site: Secondary | ICD-10-CM | POA: Diagnosis present

## 2015-08-23 DIAGNOSIS — Z87891 Personal history of nicotine dependence: Secondary | ICD-10-CM | POA: Diagnosis not present

## 2015-08-23 DIAGNOSIS — Z66 Do not resuscitate: Secondary | ICD-10-CM | POA: Diagnosis present

## 2015-08-23 DIAGNOSIS — E785 Hyperlipidemia, unspecified: Secondary | ICD-10-CM | POA: Diagnosis present

## 2015-08-23 DIAGNOSIS — E119 Type 2 diabetes mellitus without complications: Secondary | ICD-10-CM | POA: Diagnosis present

## 2015-08-23 DIAGNOSIS — Z853 Personal history of malignant neoplasm of breast: Secondary | ICD-10-CM | POA: Diagnosis not present

## 2015-08-23 DIAGNOSIS — Z6826 Body mass index (BMI) 26.0-26.9, adult: Secondary | ICD-10-CM | POA: Diagnosis not present

## 2015-08-23 DIAGNOSIS — D630 Anemia in neoplastic disease: Secondary | ICD-10-CM | POA: Diagnosis present

## 2015-08-23 DIAGNOSIS — I10 Essential (primary) hypertension: Secondary | ICD-10-CM | POA: Diagnosis present

## 2015-08-23 DIAGNOSIS — Z808 Family history of malignant neoplasm of other organs or systems: Secondary | ICD-10-CM | POA: Diagnosis not present

## 2015-08-23 DIAGNOSIS — M25552 Pain in left hip: Secondary | ICD-10-CM

## 2015-08-23 DIAGNOSIS — E440 Moderate protein-calorie malnutrition: Secondary | ICD-10-CM | POA: Diagnosis present

## 2015-08-23 DIAGNOSIS — Z85118 Personal history of other malignant neoplasm of bronchus and lung: Secondary | ICD-10-CM | POA: Diagnosis not present

## 2015-08-23 DIAGNOSIS — Z79899 Other long term (current) drug therapy: Secondary | ICD-10-CM | POA: Diagnosis not present

## 2015-08-23 DIAGNOSIS — Z823 Family history of stroke: Secondary | ICD-10-CM | POA: Diagnosis not present

## 2015-08-23 DIAGNOSIS — H409 Unspecified glaucoma: Secondary | ICD-10-CM | POA: Diagnosis present

## 2015-08-23 DIAGNOSIS — K219 Gastro-esophageal reflux disease without esophagitis: Secondary | ICD-10-CM | POA: Diagnosis present

## 2015-08-23 DIAGNOSIS — B9620 Unspecified Escherichia coli [E. coli] as the cause of diseases classified elsewhere: Secondary | ICD-10-CM | POA: Diagnosis present

## 2015-08-23 HISTORY — DX: Malignant neoplasm of unspecified part of unspecified bronchus or lung: C34.90

## 2015-08-23 HISTORY — DX: Hyperlipidemia, unspecified: E78.5

## 2015-08-23 HISTORY — DX: Malignant neoplasm of unspecified site of unspecified female breast: C50.919

## 2015-08-23 LAB — URINALYSIS COMPLETE WITH MICROSCOPIC (ARMC ONLY)
BILIRUBIN URINE: NEGATIVE
Glucose, UA: NEGATIVE mg/dL
HGB URINE DIPSTICK: NEGATIVE
KETONES UR: NEGATIVE mg/dL
NITRITE: NEGATIVE
PH: 5 (ref 5.0–8.0)
Protein, ur: 30 mg/dL — AB
SPECIFIC GRAVITY, URINE: 1.018 (ref 1.005–1.030)

## 2015-08-23 LAB — BASIC METABOLIC PANEL
ANION GAP: 9 (ref 5–15)
BUN: 15 mg/dL (ref 6–20)
CALCIUM: 7.8 mg/dL — AB (ref 8.9–10.3)
CO2: 20 mmol/L — ABNORMAL LOW (ref 22–32)
Chloride: 102 mmol/L (ref 101–111)
Creatinine, Ser: 0.96 mg/dL (ref 0.44–1.00)
GFR, EST NON AFRICAN AMERICAN: 53 mL/min — AB (ref >60)
Glucose, Bld: 146 mg/dL — ABNORMAL HIGH (ref 65–99)
Potassium: 3.7 mmol/L (ref 3.5–5.1)
Sodium: 131 mmol/L — ABNORMAL LOW (ref 135–145)

## 2015-08-23 LAB — CBC
HCT: 29.8 % — ABNORMAL LOW (ref 35.0–47.0)
HEMOGLOBIN: 9.9 g/dL — AB (ref 12.0–16.0)
MCH: 30.7 pg (ref 26.0–34.0)
MCHC: 33.3 g/dL (ref 32.0–36.0)
MCV: 92.3 fL (ref 80.0–100.0)
Platelets: 462 10*3/uL — ABNORMAL HIGH (ref 150–440)
RBC: 3.23 MIL/uL — AB (ref 3.80–5.20)
RDW: 18.2 % — ABNORMAL HIGH (ref 11.5–14.5)
WBC: 13.1 10*3/uL — ABNORMAL HIGH (ref 3.6–11.0)

## 2015-08-23 LAB — TROPONIN I: Troponin I: <0.03 ng/mL (ref <0.03)

## 2015-08-23 LAB — HEPATIC FUNCTION PANEL
ALBUMIN: 3 g/dL — AB (ref 3.5–5.0)
ALK PHOS: 77 U/L (ref 38–126)
ALT: 7 U/L — ABNORMAL LOW (ref 14–54)
AST: 19 U/L (ref 15–41)
BILIRUBIN TOTAL: 0.6 mg/dL (ref 0.3–1.2)
Bilirubin, Direct: <0.1 mg/dL — ABNORMAL LOW (ref 0.1–0.5)
Total Protein: 6.3 g/dL — ABNORMAL LOW (ref 6.5–8.1)

## 2015-08-23 MED ORDER — VERAPAMIL HCL ER 120 MG PO TBCR
120.0000 mg | EXTENDED_RELEASE_TABLET | Freq: Every day | ORAL | Status: DC
Start: 2015-08-24 — End: 2015-08-26
  Administered 2015-08-24 – 2015-08-26 (×3): 120 mg via ORAL
  Filled 2015-08-23 (×4): qty 1

## 2015-08-23 MED ORDER — TIMOLOL MALEATE 0.5 % OP SOLN
1.0000 [drp] | Freq: Two times a day (BID) | OPHTHALMIC | Status: DC
  Administered 2015-08-23 – 2015-08-26 (×6): 1 [drp] via OPHTHALMIC
  Filled 2015-08-23: qty 5

## 2015-08-23 MED ORDER — ACETAMINOPHEN 650 MG RE SUPP: 650.0000 mg | Freq: Four times a day (QID) | RECTAL | Status: DC | PRN

## 2015-08-23 MED ORDER — SODIUM CHLORIDE 0.9 % IV BOLUS (SEPSIS)
1000.0000 mL | Freq: Once | INTRAVENOUS | Status: AC
Start: 2015-08-23 — End: 2015-08-23
  Administered 2015-08-23: 1000 mL via INTRAVENOUS

## 2015-08-23 MED ORDER — HYDROCODONE-ACETAMINOPHEN 5-325 MG PO TABS: 1.0000 | ORAL_TABLET | ORAL | Status: DC | PRN

## 2015-08-23 MED ORDER — BRIMONIDINE TARTRATE-TIMOLOL 0.2-0.5 % OP SOLN: 1.0000 [drp] | Freq: Two times a day (BID) | OPHTHALMIC | Status: DC

## 2015-08-23 MED ORDER — SODIUM CHLORIDE 0.9 % IV SOLN
INTRAVENOUS | Status: DC
  Administered 2015-08-23: 21:00:00 via INTRAVENOUS

## 2015-08-23 MED ORDER — ZOLPIDEM TARTRATE 5 MG PO TABS: 5.0000 mg | ORAL_TABLET | Freq: Every evening | ORAL | Status: DC | PRN

## 2015-08-23 MED ORDER — ONDANSETRON HCL 4 MG PO TABS: 4.0000 mg | ORAL_TABLET | Freq: Four times a day (QID) | ORAL | Status: DC | PRN

## 2015-08-23 MED ORDER — LOSARTAN POTASSIUM 50 MG PO TABS
100.0000 mg | ORAL_TABLET | Freq: Every day | ORAL | Status: DC
  Administered 2015-08-24: 100 mg via ORAL
  Filled 2015-08-23: qty 2

## 2015-08-23 MED ORDER — ALBUTEROL SULFATE (2.5 MG/3ML) 0.083% IN NEBU
2.5000 mg | INHALATION_SOLUTION | Freq: Three times a day (TID) | RESPIRATORY_TRACT | Status: DC
  Administered 2015-08-23: 22:00:00 2.5 mg via RESPIRATORY_TRACT
  Filled 2015-08-23 (×2): qty 3

## 2015-08-23 MED ORDER — ASPIRIN EC 81 MG PO TBEC
81.0000 mg | DELAYED_RELEASE_TABLET | Freq: Every day | ORAL | Status: DC
  Administered 2015-08-23 – 2015-08-26 (×4): 81 mg via ORAL
  Filled 2015-08-23 (×4): qty 1

## 2015-08-23 MED ORDER — DEXTROSE 5 % IV SOLN
1.0000 g | INTRAVENOUS | Status: DC
  Administered 2015-08-23 – 2015-08-25 (×3): 1 g via INTRAVENOUS
  Filled 2015-08-23 (×4): qty 10

## 2015-08-23 MED ORDER — METFORMIN HCL ER 500 MG PO TB24
500.0000 mg | ORAL_TABLET | Freq: Three times a day (TID) | ORAL | Status: DC
  Administered 2015-08-24 – 2015-08-26 (×7): 500 mg via ORAL
  Filled 2015-08-23 (×8): qty 1

## 2015-08-23 MED ORDER — GLIMEPIRIDE 2 MG PO TABS: 2.0000 mg | ORAL_TABLET | Freq: Every day | ORAL | Status: DC

## 2015-08-23 MED ORDER — LEVOTHYROXINE SODIUM 50 MCG PO TABS
50.0000 ug | ORAL_TABLET | Freq: Every day | ORAL | Status: DC
  Administered 2015-08-24 – 2015-08-26 (×3): 50 ug via ORAL
  Filled 2015-08-23 (×2): qty 1

## 2015-08-23 MED ORDER — BRIMONIDINE TARTRATE 0.2 % OP SOLN
1.0000 [drp] | Freq: Two times a day (BID) | OPHTHALMIC | Status: DC
  Administered 2015-08-23 – 2015-08-26 (×6): 1 [drp] via OPHTHALMIC
  Filled 2015-08-23: qty 5

## 2015-08-23 MED ORDER — LATANOPROST 0.005 % OP SOLN
1.0000 [drp] | Freq: Every day | OPHTHALMIC | Status: DC
  Administered 2015-08-23 – 2015-08-25 (×3): 1 [drp] via OPHTHALMIC
  Filled 2015-08-23: qty 2.5

## 2015-08-23 MED ORDER — COLESEVELAM HCL 625 MG PO TABS
625.0000 mg | ORAL_TABLET | Freq: Two times a day (BID) | ORAL | Status: DC
  Administered 2015-08-24 – 2015-08-26 (×5): 625 mg via ORAL
  Filled 2015-08-23 (×6): qty 1

## 2015-08-23 MED ORDER — EZETIMIBE 10 MG PO TABS
10.0000 mg | ORAL_TABLET | Freq: Every day | ORAL | Status: DC
  Administered 2015-08-24 – 2015-08-26 (×3): 10 mg via ORAL
  Filled 2015-08-23 (×4): qty 1

## 2015-08-23 MED ORDER — ONDANSETRON HCL 4 MG/2ML IJ SOLN
4.0000 mg | Freq: Four times a day (QID) | INTRAMUSCULAR | Status: DC | PRN
  Administered 2015-08-23: 21:00:00 4 mg via INTRAVENOUS
  Filled 2015-08-23: qty 2

## 2015-08-23 MED ORDER — ENOXAPARIN SODIUM 40 MG/0.4ML ~~LOC~~ SOLN
40.0000 mg | SUBCUTANEOUS | Status: DC
  Administered 2015-08-23 – 2015-08-25 (×3): 40 mg via SUBCUTANEOUS
  Filled 2015-08-23 (×3): qty 0.4

## 2015-08-23 MED ORDER — ACETAMINOPHEN 325 MG PO TABS
650.0000 mg | ORAL_TABLET | Freq: Four times a day (QID) | ORAL | Status: DC | PRN
Start: 2015-08-23 — End: 2015-08-26

## 2015-08-23 NOTE — ED Provider Notes (Addendum)
[facility] Emergency Department Provider Note  ____________________________________________   I have reviewed the triage vital signs and the nursing notes.   HISTORY  Chief Complaint Weakness    HPI Madison Fry is a 82 y.o. female has a history of lung cancer and chemotherapy. Patient also has diabetes and hypertensionshe presents today complaining of generalized weakness after chemotherapy yesterday. She states that she felt generally weak and slid off the chair. She did not hit her head or pass out. She trouble getting up and that is why they brought her in. It was thought that she was dehydrated. She has had decreased by mouth. Patient is also under treatment for urinary tract infection. We have had recently change her antibiotics for this. She denies any focal numbness or weakness or headache chest pain shortness of breath nausea vomiting or abdominal pain the rest of her review of systems is generally negative she just feels generally weak. She specifically denies any focal weakness. Patient and family are very angry that there was a wait in the emergency department prior to being seen and I have tried him off and on to the extent that I can      Past Medical History:  Diagnosis Date  . Cancer [facility])    breast cancer, Right side lymph nodes removed  . Diabetes mellitus without complication ([place])   . Hypertension     Patient Active Problem List   Diagnosis Date Noted  . Metastatic small cell carcinoma to bone ([place]) 04/29/2015  . Dyspnea 04/10/2015  . Acute anxiety 04/10/2015  . Leukocytosis 04/10/2015  . Hyponatremia 04/10/2015  . Type 2 diabetes mellitus ([place]) 04/10/2015  . UTI (lower urinary tract infection) 08/19/2014  . Hip pain, acute 08/17/2014  . Elevated troponin 08/17/2014    Past Surgical History:  Procedure Laterality Date  . VIDEO BRONCHOSCOPY Bilateral 04/12/2015   Procedure: VIDEO BRONCHOSCOPY WITH FLUORO;  Surgeon: Juanito Doom, MD;  Location: [facility] ENDOSCOPY;  Service: Cardiopulmonary;  Laterality: Bilateral;    Current Outpatient Rx  . Order #: [id number] Class: Normal  . Order #: [id number] Class: Historical Med  . Order #: [id number] Class: Historical Med  . Order #: [id number] Class: Historical Med  . Order #: [id number] Class: Historical Med  . Order #: [id number] Class: Historical Med  . Order #: [id number] Class: Historical Med  . Order #: [id number] Class: Historical Med  . Order #: [id number] Class: Historical Med  . Order #: [id number] Class: Historical Med  . Order #: [id number] Class: Normal  . Order #: [id number] Class: Historical Med  . Order #: [id number] Class: Historical Med  . Order #: [id number] Class: Print    Allergies Shellfish allergy  Family History  Problem Relation Age of Onset  . Bone cancer Father   . Stroke Brother     Social History Social History  Substance Use Topics  . Smoking status: Former Smoker    Packs/day: 1.50    Types: Cigarettes    Quit date: 08/27/1990  . Smokeless tobacco: Never Used  . Alcohol use No    Review of Systems Constitutional: No fever/chills Eyes: No visual changes. ENT: No sore throat. No stiff neck no neck pain Cardiovascular: Denies chest pain. Respiratory: Denies shortness of breath. Gastrointestinal:   no vomiting.  No diarrhea.  No constipation. Genitourinary: Negative for dysuria. Musculoskeletal: Negative lower extremity swelling Skin: Negative for rash. Neurological: Negative for severe headaches, focal weakness or numbness. 10-point ROS otherwise negative.  ____________________________________________   PHYSICAL EXAM:  VITAL SIGNS: ED Triage Vitals  Enc Vitals  Group     BP 08/23/15 1217 (!) 122/44     Pulse Rate 08/23/15 1217 94     Resp 08/23/15 1217 16     Temp 08/23/15 1217 99.1 F (37.3 C)     Temp Source 08/23/15 1217 Oral     SpO2 08/23/15 1217 97 %     Weight 08/23/15 1217 155 lb 10.3 oz (70.6 kg)     Height 08/23/15 1217 '5\' 4"'$   (1.626 m)     Head Circumference --      Peak Flow --      Pain Score 08/23/15 1218 0     Pain Loc --      Pain Edu? --      Excl. in [place]? --     Constitutional: Alert and oriented. Well appearing and in no acute distress. Eyes: Conjunctivae are normal. PERRL. EOMI. Head: Atraumatic. Nose: No congestion/rhinnorhea. Mouth/Throat: Mucous membranes are moist.  Oropharynx non-erythematous. Neck: No stridor.   Nontender with no meningismus Cardiovascular: Normal rate, regular rhythm. Grossly normal heart sounds.  Good peripheral circulation. Respiratory: Normal respiratory effort.  No retractions. Lungs CTAB. Abdominal: Soft and nontender. No distention. No guarding no rebound Back:  There is no focal tenderness or step off.  there is no midline tenderness there are no lesions noted. there is no CVA tenderness Musculoskeletal: No lower extremity tenderness, no upper extremity tenderness. No joint effusions, no DVT signs strong distal pulses no edema Neurologic:  Normal speech and language. No gross focal neurologic deficits are appreciated.  Skin:  Skin is warm, dry and intact. No rash noted. Psychiatric: Mood and affect are normal. Speech and behavior are normal.  ____________________________________________   LABS (all labs ordered are listed, but only abnormal results are displayed)  Labs Reviewed  BASIC METABOLIC PANEL - Abnormal; Notable for the following:       Result Value   Sodium 131 (*)    CO2 20 (*)    Glucose, Bld 146 (*)    Calcium 7.8 (*)    GFR calc non Af Amer 53 (*)    All other components within normal limits  CBC - Abnormal; Notable for the following:    WBC 13.1 (*)    RBC 3.23 (*)    Hemoglobin 9.9 (*)    HCT 29.8 (*)    RDW 18.2 (*)    Platelets 462 (*)    All other components within normal limits  HEPATIC FUNCTION PANEL - Abnormal; Notable for the following:    Total Protein 6.3 (*)    Albumin 3.0 (*)    ALT 7 (*)    Bilirubin, Direct <0.1 (*)     All other components within normal limits  TROPONIN I  URINALYSIS COMPLETEWITH MICROSCOPIC ([facility] ONLY)   ____________________________________________  EKG  I personally interpreted any EKGs ordered by me or triage Sinus rate 93 bpm no acute ST elevation or depression normal axis nonspecific ST changes ____________________________________________  RADIOLOGY  I reviewed any imaging ordered by me or triage that were performed during my shift and, if possible, patient and/or family made aware of any abnormal findings. ____________________________________________   PROCEDURES  Procedure(s) performed: None  Procedures  Critical Care performed: None  ____________________________________________   INITIAL IMPRESSION / ASSESSMENT AND PLAN / ED COURSE  Pertinent labs & imaging results that were available during my care of the patient were reviewed by me and considered in my medical decision making (see chart for details).  Patient on chemotherapy who  feels generally weak, her exam is reassuring she is nontoxic in appearance but she does feel too weak to get out of low and she fell. No evidence of trauma from fall do not think a CT scan is warranted. Patient is in no acute distress but she feels dehydrated. We will check a urine and she has been battling urinary tract infections, we'll give her IV fluid and reassessed.  Clinical Course   ____________________________________________   FINAL CLINICAL IMPRESSION(S) / ED DIAGNOSES  Final diagnoses:  None   ----------------------------------------- 5:54 PM on 08/23/2015 -----------------------------------------  Has evidence of ongoing uti and generalized weakness. Family feels she is unsafe to go home after falling and we will admit for hydration.   This chart was dictated using voice recognition software.  Despite best efforts to proofread,  errors can occur which can change meaning.      Schuyler Amor, MD 08/23/15  1639    Schuyler Amor, MD 08/23/15 1754

## 2015-08-23 NOTE — ED Triage Notes (Signed)
Patient brought in by PTAR from home. Patient is cancer patient who is c/o generalized weakness that is worse than normal. Patient states that she is not able to eat because "nothing taste right." Per PTAR, patient slid out of her power chair today and landed on on the foot rest, patient was assisted back into her chair by FD. Patient denies any pain.

## 2015-08-23 NOTE — H&P (Signed)
[facility] [place] NAME: Madison Fry    MR#:  [id number]  DATE OF BIRTH:  1928/04/11  DATE OF ADMISSION:  08/23/2015  PRIMARY CARE PHYSICIAN: Mayra Neer, MD   REQUESTING/REFERRING PHYSICIAN: Dr. Charlotte Crumb  CHIEF COMPLAINT:   Chief Complaint  Patient presents with  . Weakness    HISTORY OF PRESENT ILLNESS:  Madison Fry  is a 82 y.o. female with a known history of Breast cancer, metastatic lung cancer, history of previous UTI, diabetes, hypertension, hyperlipidemia presents to the hospital due to generalized weakness and lethargy. Patient says that she has been feeling more weak than usual. She did have chemotherapy a few days back and since then has been having some diarrhea and feeling weak. She is also had a recent urinary tract infection for which she was treated with multiple lines of antibiotics without much improvement in his symptoms. She was recently started on Bactrim only a couple days back. Prior to that she was on ciprofloxacin and it seems that the Escherichia coli was resistant to it. She now presents to the hospital due to persistent symptoms of weakness which could be multifactorial related to underlying chemotherapy, GERD with underlying UTI having failed oral antibiotics and therefore hospitalist services were contacted further treatment and evaluation.   PAST MEDICAL HISTORY:   Past Medical History:  Diagnosis Date  . Breast cancer ([place])   . Cancer [facility])    breast cancer, Right side lymph nodes removed  . Diabetes mellitus without complication ([place])   . Hyperlipidemia   . Hypertension   . Lung cancer ([place])     PAST SURGICAL HISTORY:   Past Surgical History:  Procedure Laterality Date  . VIDEO BRONCHOSCOPY Bilateral 04/12/2015   Procedure: VIDEO BRONCHOSCOPY WITH FLUORO;  Surgeon: Juanito Doom, MD;  Location: [facility];  Service: Cardiopulmonary;  Laterality: Bilateral;    SOCIAL HISTORY:    Social History  Substance Use Topics  . Smoking status: Former Smoker    Packs/day: 1.50    Years: 40.00    Types: Cigarettes    Quit date: 08/27/1990  . Smokeless tobacco: Never Used  . Alcohol use No    FAMILY HISTORY:   Family History  Problem Relation Age of Onset  . Bone cancer Father   . Stroke Brother     DRUG ALLERGIES:   Allergies  Allergen Reactions  . Shellfish Allergy Swelling    REVIEW OF SYSTEMS:   Review of Systems  Constitutional: Positive for malaise/fatigue. Negative for fever and weight loss.  HENT: Negative for congestion, nosebleeds and tinnitus.   Eyes: Negative for blurred vision, double vision and redness.  Respiratory: Negative for cough, hemoptysis and shortness of breath.   Cardiovascular: Negative for chest pain, orthopnea, leg swelling and PND.  Gastrointestinal: Negative for abdominal pain, diarrhea, melena, nausea and vomiting.  Genitourinary: Negative for dysuria, hematuria and urgency.  Musculoskeletal: Negative for falls and joint pain.  Neurological: Positive for weakness. Negative for dizziness, tingling, sensory change, focal weakness, seizures and headaches.  Endo/Heme/Allergies: Negative for polydipsia. Does not bruise/bleed easily.  Psychiatric/Behavioral: Negative for depression and memory loss. The patient is not nervous/anxious.     MEDICATIONS AT HOME:   Prior to Admission medications   Medication Sig Start Date End Date Taking? Authorizing Provider  albuterol (PROVENTIL) (2.5 MG/3ML) 0.083% nebulizer solution Take 3 mLs (2.5 mg total) by nebulization 3 (three) times daily. 04/29/15  Yes Laverle Hobby, MD  aspirin EC 81 MG  tablet Take 81 mg by mouth daily.   Yes Historical Provider, MD  colesevelam (WELCHOL) 625 MG tablet Take by mouth 2 (two) times daily with a meal.   Yes Historical Provider, MD  COMBIGAN 0.2-0.5 % ophthalmic solution PUT 1 DROP IN AFFECTED EYE EVERY 12 HOURS**NEED OFFICE VISIT** 03/08/15  Yes  Historical Provider, MD  ezetimibe (ZETIA) 10 MG tablet Take 10 mg by mouth daily.   Yes Historical Provider, MD  glimepiride (AMARYL) 2 MG tablet Take 2 mg by mouth daily with breakfast.   Yes Historical Provider, MD  HYDROcodone-acetaminophen (NORCO/VICODIN) 5-325 MG tablet TAKE 1-2 TABLETS BY MOUTH EVERY 8 HOURS AS NEEDED FOR PAIN 11/06/14  Yes Historical Provider, MD  levothyroxine (SYNTHROID, LEVOTHROID) 50 MCG tablet Take 50 mcg by mouth daily.   Yes Historical Provider, MD  losartan (COZAAR) 100 MG tablet Take 100 mg by mouth daily.   Yes Historical Provider, MD  metFORMIN (GLUCOPHAGE-XR) 500 MG 24 hr tablet Take 500 mg by mouth 3 (three) times daily with meals.   Yes Historical Provider, MD  sulfamethoxazole-trimethoprim (BACTRIM DS,SEPTRA DS) 800-160 MG tablet Take 1 tablet by mouth 2 (two) times daily. 08/22/15  Yes Lloyd Huger, MD  TRAVATAN Z 0.004 % SOLN ophthalmic solution PUT 1 DROP INTO BOTH EYES AT BEDTIME 03/16/15  Yes Historical Provider, MD  verapamil (CALAN-SR) 120 MG CR tablet Take 120 mg by mouth daily.   Yes Historical Provider, MD  zolpidem (AMBIEN) 5 MG tablet Take 1 tablet (5 mg total) by mouth at bedtime as needed for sleep. 07/18/15  Yes Lloyd Huger, MD      VITAL SIGNS:  Blood pressure 120/60, pulse 97, temperature 97.8 F (36.6 C), temperature source Oral, resp. rate (!) 32, height '5\' 4"'$  (1.626 m), weight 70.6 kg (155 lb 10.3 oz), SpO2 96 %.  PHYSICAL EXAMINATION:  Physical Exam  GENERAL:  82 y.o.-year-old patient lying in the bed in no acute distress.  EYES: Pupils equal, round, reactive to light and accommodation. No scleral icterus. Extraocular muscles intact.  HEENT: Head atraumatic, normocephalic. Oropharynx and nasopharynx clear. No oropharyngeal erythema, moist oral mucosa  NECK:  Supple, no jugular venous distention. No thyroid enlargement, no tenderness.  LUNGS: Normal breath sounds bilaterally, no wheezing, rales, rhonchi. No use of  accessory muscles of respiration.  CARDIOVASCULAR: S1, S2 RRR. No murmurs, rubs, gallops, clicks.  ABDOMEN: Soft, nontender, nondistended. Bowel sounds present. No organomegaly or mass.  EXTREMITIES: No pedal edema, cyanosis, or clubbing. + 2 pedal & radial pulses b/l.   NEUROLOGIC: Cranial nerves II through XII are intact. No focal Motor or sensory deficits appreciated b/l. Globally weak.  PSYCHIATRIC: The patient is alert and oriented x 3. Good affect.  SKIN: No obvious rash, lesion, or ulcer.   LABORATORY PANEL:   CBC  Recent Labs Lab 08/23/15 1222  WBC 13.1*  HGB 9.9*  HCT 29.8*  PLT 462*   ------------------------------------------------------------------------------------------------------------------  Chemistries   Recent Labs Lab 08/23/15 1222  NA 131*  K 3.7  CL 102  CO2 20*  GLUCOSE 146*  BUN 15  CREATININE 0.96  CALCIUM 7.8*  AST 19  ALT 7*  ALKPHOS 77  BILITOT 0.6   ------------------------------------------------------------------------------------------------------------------  Cardiac Enzymes  Recent Labs Lab 08/23/15 1222  TROPONINI <0.03   ------------------------------------------------------------------------------------------------------------------  RADIOLOGY:  Dg Chest 2 View  Result Date: 08/23/2015 CLINICAL DATA:  Small cell lung carcinoma.  Fall earlier today EXAM: CHEST  2 VIEW COMPARISON:  Chest CT July 12, 2015 FINDINGS:  There is a moderate pleural effusion on the right. There is patchy airspace consolidation in the left upper lobe, stable. There is no well-defined mass or adenopathy by radiography. The heart size and pulmonary vascularity are normal. There is atherosclerotic calcification in the aortic arch. Bones are osteoporotic. No blastic or lytic bone lesions are identified. There is arthropathy in both shoulders and in the thoracic spine. IMPRESSION: Moderate pleural effusion on the right. Airspace consolidation right upper  lobe, stable. No new opacity. Stable cardiac silhouette. No adenopathy evident. Bones osteoporotic. Aortic atherosclerosis. Electronically Signed   By: Lowella Grip III M.D.   On: 08/23/2015 14:54    IMPRESSION AND PLAN:   82 year old female with past history of breast cancer, lung cancer with metastatic disease currently getting chemotherapy, hypertension, hyperlipidemia, hypothyroidism, glaucoma, diabetes who presents to the hospital due to weakness.  1. Urinary tract infection-this is the cause of patient's weakness. -Patient was on oral Bactrim as an outpatient but prior to that she was on ciprofloxacin and her urine cultures are growing Escherichia coli which is resistant to fluoroquinolones. I will place on IV ceftriaxone, follow clinically.  2. Generalized weakness-secondary to the UTI, GERD with underlying lung cancer and chemotherapy. -Continue treat UTI as mentioned above, cont. Supportive care.   3. Diabetes type 2 without, location-continue metformin, glimepiride.  4. Glaucoma-continue Combigan, Travatan eyedrops.  5. Essential hypertension-continue losartan, verapamil.  6. Hypothyroidism-continue Synthroid.  7. Anemia of chronic disease-secondary to malignancy, ongoing chemotherapy. -No evidence of need for transfusion, follow serial counts.    All the records are reviewed and case discussed with ED provider. Management plans discussed with the patient, family and they are in agreement.  CODE STATUS: DO NOT RESUSCITATE  TOTAL TIME TAKING CARE OF THIS PATIENT: 45 minutes.    Henreitta Leber M.D on 08/23/2015 at 7:42 PM  Between 7am to 6pm - Pager - [phone]  After 6pm go to [email or web address] - password [email or web address] [facility]  Office  [phone]  CC: Primary care physician; Mayra Neer, MD

## 2015-08-23 NOTE — ED Notes (Signed)
Ice cream provided, pt and pt's family updated on admission process.

## 2015-08-24 ENCOUNTER — Inpatient Hospital Stay: Payer: Commercial Managed Care - HMO

## 2015-08-24 LAB — BASIC METABOLIC PANEL
Anion gap: 7 (ref 5–15)
BUN: 15 mg/dL (ref 6–20)
CALCIUM: 6.9 mg/dL — AB (ref 8.9–10.3)
CO2: 21 mmol/L — AB (ref 22–32)
CREATININE: 0.72 mg/dL (ref 0.44–1.00)
Chloride: 103 mmol/L (ref 101–111)
GFR calc Af Amer: >60 mL/min (ref >60)
GFR calc non Af Amer: >60 mL/min (ref >60)
GLUCOSE: 54 mg/dL — AB (ref 65–99)
Potassium: 3.9 mmol/L (ref 3.5–5.1)
Sodium: 131 mmol/L — ABNORMAL LOW (ref 135–145)

## 2015-08-24 LAB — CBC
HCT: 25.4 % — ABNORMAL LOW (ref 35.0–47.0)
HEMOGLOBIN: 8.7 g/dL — AB (ref 12.0–16.0)
MCH: 30.9 pg (ref 26.0–34.0)
MCHC: 34.2 g/dL (ref 32.0–36.0)
MCV: 90.4 fL (ref 80.0–100.0)
Platelets: 365 10*3/uL (ref 150–440)
RBC: 2.81 MIL/uL — ABNORMAL LOW (ref 3.80–5.20)
RDW: 18.7 % — AB (ref 11.5–14.5)
WBC: 8 10*3/uL (ref 3.6–11.0)

## 2015-08-24 LAB — GLUCOSE, CAPILLARY
Glucose-Capillary: 97 mg/dL (ref 65–99)
Glucose-Capillary: 144 mg/dL — ABNORMAL HIGH (ref 65–99)
Glucose-Capillary: 136 mg/dL — ABNORMAL HIGH (ref 65–99)

## 2015-08-24 LAB — HEMOGLOBIN A1C

## 2015-08-24 MED ORDER — INSULIN ASPART 100 UNIT/ML ~~LOC~~ SOLN
0.0000 [IU] | Freq: Three times a day (TID) | SUBCUTANEOUS | Status: DC
  Administered 2015-08-24 – 2015-08-25 (×2): 1 [IU] via SUBCUTANEOUS
  Filled 2015-08-24 (×2): qty 1

## 2015-08-24 MED ORDER — GLIMEPIRIDE 2 MG PO TABS
1.0000 mg | ORAL_TABLET | Freq: Every day | ORAL | Status: DC
  Administered 2015-08-25 – 2015-08-26 (×2): 1 mg via ORAL
  Filled 2015-08-24 (×2): qty 1

## 2015-08-24 MED ORDER — SODIUM CHLORIDE 0.9 % IV BOLUS (SEPSIS)
500.0000 mL | Freq: Once | INTRAVENOUS | Status: AC
  Administered 2015-08-24: 15:00:00 500 mL via INTRAVENOUS

## 2015-08-24 MED ORDER — ENSURE ENLIVE PO LIQD
237.0000 mL | Freq: Two times a day (BID) | ORAL | Status: DC
  Administered 2015-08-24 – 2015-08-25 (×3): 237 mL via ORAL

## 2015-08-24 MED ORDER — ALBUTEROL SULFATE (2.5 MG/3ML) 0.083% IN NEBU
2.5000 mg | INHALATION_SOLUTION | Freq: Three times a day (TID) | RESPIRATORY_TRACT | Status: DC
  Administered 2015-08-24 – 2015-08-26 (×8): 2.5 mg via RESPIRATORY_TRACT
  Filled 2015-08-24 (×7): qty 3

## 2015-08-24 NOTE — Progress Notes (Signed)
   08/24/15 0800  Clinical Encounter Type  Visited With Patient  Visit Type Initial  Referral From Nurse  Consult/Referral To Chaplain  Spiritual Encounters  Spiritual Needs Literature  Stress Factors  Patient Stress Factors Health changes  Family Stress Factors Not reviewed  Advance Directives (For Healthcare)  Does patient have an advance directive? No  Would patient like information on creating an advanced directive? Yes - Scientist, clinical (histocompatibility and immunogenetics) given  Visited patient and provided AD documents. Pt. advised that her daughter may be interested. Advised her to ask nursing staff to page me if they wish to proceed. Also, visited to ascertain her morale. She expressed high spirits and comfort. She appeared slightly fatigued. Advised her that I am available to just visit if she desires. Will follow up late morning/early afternoon today.  Chap. Jadalyn Oliveri G. Pomeroy

## 2015-08-24 NOTE — Progress Notes (Signed)
[facility] [place] NAME: Madison Fry    MR#:  [id number]  DATE OF BIRTH:  Oct 08, 1928  SUBJECTIVE:  CHIEF COMPLAINT:   Chief Complaint  Patient presents with  . Weakness   Continues to feel weak. Left hip pain. No dysuria or hematuria.  REVIEW OF SYSTEMS:    Review of Systems  Constitutional: Positive for malaise/fatigue and weight loss. Negative for chills and fever.  HENT: Negative for sore throat.   Eyes: Negative for blurred vision, double vision and pain.  Respiratory: Negative for cough, hemoptysis, shortness of breath and wheezing.   Cardiovascular: Negative for chest pain, palpitations, orthopnea and leg swelling.  Gastrointestinal: Negative for abdominal pain, constipation, diarrhea, heartburn, nausea and vomiting.  Genitourinary: Negative for dysuria and hematuria.  Musculoskeletal: Positive for back pain and joint pain.  Skin: Negative for rash.  Neurological: Positive for weakness. Negative for sensory change, speech change, focal weakness and headaches.  Endo/Heme/Allergies: Does not bruise/bleed easily.  Psychiatric/Behavioral: Negative for depression. The patient is not nervous/anxious.     DRUG ALLERGIES:   Allergies  Allergen Reactions  . Shellfish Allergy Swelling    VITALS:  Blood pressure (!) 122/49, pulse 95, temperature 99.5 F (37.5 C), temperature source Oral, resp. rate (!) 22, height '5\' 5"'$  (1.651 m), weight 73 kg (161 lb), SpO2 95 %.  PHYSICAL EXAMINATION:   Physical Exam  GENERAL:  82 y.o.-year-old patient lying in the bed with no acute distress.  EYES: Pupils equal, round, reactive to light and accommodation. No scleral icterus. Extraocular muscles intact.  HEENT: Head atraumatic, normocephalic. Oropharynx and nasopharynx clear.  NECK:  Supple, no jugular venous distention. No thyroid enlargement, no tenderness.  LUNGS: Normal breath sounds bilaterally, no wheezing, rales, rhonchi. No  use of accessory muscles of respiration.  CARDIOVASCULAR: S1, S2 normal. No murmurs, rubs, or gallops.  ABDOMEN: Soft, nontender, nondistended. Bowel sounds present. No organomegaly or mass.  EXTREMITIES: No cyanosis, clubbing or edema b/l.  Left hip tenderness. No shortening of the leg noticed.  NEUROLOGIC: Cranial nerves II through XII are intact. No focal Motor or sensory deficits b/l.   PSYCHIATRIC: The patient is alert and oriented x 3.  SKIN: No obvious rash, lesion, or ulcer.   LABORATORY PANEL:   CBC  Recent Labs Lab 08/24/15 0520  WBC 8.0  HGB 8.7*  HCT 25.4*  PLT 365   ------------------------------------------------------------------------------------------------------------------ Chemistries   Recent Labs Lab 08/23/15 1222 08/24/15 0520  NA 131* 131*  K 3.7 3.9  CL 102 103  CO2 20* 21*  GLUCOSE 146* 54*  BUN 15 15  CREATININE 0.96 0.72  CALCIUM 7.8* 6.9*  AST 19  --   ALT 7*  --   ALKPHOS 77  --   BILITOT 0.6  --    ------------------------------------------------------------------------------------------------------------------  Cardiac Enzymes  Recent Labs Lab 08/23/15 1222  TROPONINI <0.03   ------------------------------------------------------------------------------------------------------------------  RADIOLOGY:  Dg Chest 2 View  Result Date: 08/23/2015 CLINICAL DATA:  Small cell lung carcinoma.  Fall earlier today EXAM: CHEST  2 VIEW COMPARISON:  Chest CT July 12, 2015 FINDINGS: There is a moderate pleural effusion on the right. There is patchy airspace consolidation in the left upper lobe, stable. There is no well-defined mass or adenopathy by radiography. The heart size and pulmonary vascularity are normal. There is atherosclerotic calcification in the aortic arch. Bones are osteoporotic. No blastic or lytic bone lesions are identified. There is arthropathy in both shoulders and in the  thoracic spine. IMPRESSION: Moderate pleural effusion on  the right. Airspace consolidation right upper lobe, stable. No new opacity. Stable cardiac silhouette. No adenopathy evident. Bones osteoporotic. Aortic atherosclerosis. Electronically Signed   By: Lowella Grip III M.D.   On: 08/23/2015 14:54    ASSESSMENT AND PLAN:   82 year old female with past history of breast cancer, lung cancer with metastatic disease currently getting chemotherapy, hypertension, hyperlipidemia, hypothyroidism, glaucoma, diabetes who presented to the hospital due to weakness.  # Urinary tract infection, Escherichia coli Patient was treated with ciprofloxacin to which this strain is resistant. Started on IV ceftriaxone. Continue 1 more day. Likely discharge tomorrow on Keflex.  # Generalized weakness Patient has been progressively declining with her cancer and ongoing chemotherapy. Now contributing by UTI. Physical therapy consult.  # Diabetes type 2  continue metformin Decrease dose of glipizide.  # Glaucoma continue Combigan, Travatan eyedrops.  # Essential hypertension continue losartan, verapamil.  # Hypothyroidism continue Synthroid.  # Anemia of chronic disease secondary to malignancy, ongoing chemotherapy.  # Left hip pain After fall. Check x-ray of left hip. This could also be due to bony metastases from her lung cancer.  All the records are reviewed and case discussed with Care Management/Social Workerr. Management plans discussed with the patient, family and they are in agreement.  CODE STATUS: FULL CODE  DVT Prophylaxis: SCDs  TOTAL TIME TAKING CARE OF THIS PATIENT: 30 minutes.   POSSIBLE D/C IN 1-2 DAYS, DEPENDING ON CLINICAL CONDITION.  [name] R M.D on 08/24/2015 at 10:42 AM  Between 7am to 6pm - Pager - [phone]  After 6pm go to [email or web address] - password [email or web address] [facility]  Office  [phone]  CC: Primary care physician; Mayra Neer, MD  Note: This dictation was prepared with  Dragon dictation along with smaller phrase technology. Any transcriptional errors that result from this process are unintentional.

## 2015-08-24 NOTE — Progress Notes (Signed)
Initial Nutrition Assessment  DOCUMENTATION CODES:   Non-severe (moderate) malnutrition in context of acute illness/injury  INTERVENTION:  1.  Meals/snacks; discussed nutrition-related concerns and needs with patient and family at bedside.  Patient states that sour foods taste best now that she is on chemo.  Reviewed snacks available and encouraged family to bring patient foods she likes from home if able.  2.  Supplements; Ensure Enlive BID, chocolate  NUTRITION DIAGNOSIS:   Malnutrition related to inability to eat, altered GI function as evidenced by per patient/family report, mild depletion of body fat, moderate depletions of muscle mass.  GOAL:   Patient will meet greater than or equal to 90% of their needs  MONITOR:   PO intake, Supplement acceptance  REASON FOR ASSESSMENT:   Malnutrition Screening Tool    ASSESSMENT:   RD drawn to chart via MST screening.   Patient with known history of breast cancer, metastatic lung cancer, DM, HTN, HLD.  Currently on chemo.   Patient admitted with weakness, left hip pain.  Patient states "hopefully they'll let me go tomorrow."  Patient lives at home alone.  She has a motorized wheelchair at home for mobility.  Patient states she has lost 20 lbs in 2 weeks.  Endorses poor appetite, nausea, and vomiting at home due to chemo.  States "nothing tastes good."  Discussed the role of chemo in altered taste.  Discussed importance of maintaining weight.   Note PT recommendation for SNF.   Diet Order:  Diet Heart Room service appropriate? Yes; Fluid consistency: Thin  Skin:   Intact  Last BM:  7/28  Height:   Ht Readings from Last 1 Encounters:  08/23/15 '5\' 5"'$  (1.651 m)    Weight:   Wt Readings from Last 1 Encounters:  08/23/15 161 lb (73 kg)    Ideal Body Weight:     BMI:  Body mass index is 26.79 kg/m.  Estimated Nutritional Needs:   Kcal:  1650-1800  Protein:  65-80g  Fluid:  1.8 L/day  EDUCATION NEEDS:    Education needs addressed  Brynda Greathouse, MS RD LDN Clinical Inpatient Dietitian Weekend/After hours pager: [phone]

## 2015-08-24 NOTE — Care Management Important Message (Signed)
Important Message  Patient Details  Name: ARNIE MAIOLO MRN: [id number] Date of Birth: March 12, 1928   Medicare Important Message Given:  Yes    Shandra Szymborski A, RN 08/24/2015, 3:17 PM

## 2015-08-24 NOTE — Evaluation (Signed)
Physical Therapy Evaluation Patient Details Name: Madison Fry MRN: [id number] DOB: [birth year] Today's Date: 08/24/2015   History of Present Illness  82 yo female with onset of breast CA with lung mets and in active chemotherapy has been weak and noting L hip pain since the last chemotherapy visit a few days ago.  Pt reports sittting down on the legrests in a short fall from her power wc and may have injured L hip  Clinical Impression  Pt is up to bedside with PT and due to weakness and pain of L groin she is unable to walk away from side of bed.  Her plan is to continue acute therapy to increase strength and endurance for standing, and to progress to SNF rehab for a return home with her caregivers.  Pt is possibly going to need to move to ALF care depending on her prognosis.  Will focus on her ability to transfer and stand in a stable way, with progression to chair and then to increase gait endurance.    Follow Up Recommendations SNF    Equipment Recommendations  None recommended by PT    Recommendations for Other Services Rehab consult     Precautions / Restrictions Precautions Precautions: Fall Restrictions Weight Bearing Restrictions: No      Mobility  Bed Mobility Overal bed mobility: Needs Assistance Bed Mobility: Supine to Sit;Sit to Supine     Supine to sit: Min assist;Mod assist Sit to supine: Min assist;Mod assist   General bed mobility comments: pt is struggling with LLE pain and generalized weakness  Transfers Overall transfer level: Needs assistance Equipment used: Rolling walker (2 wheeled);1 person hand held assist Transfers: Sit to/from Stand Sit to Stand: Min assist;Mod assist         General transfer comment: reminders for hand placement  Ambulation/Gait Ambulation/Gait assistance: Min assist Ambulation Distance (Feet): 4 Feet Assistive device: Rolling walker (2 wheeled);1 person hand held assist Gait Pattern/deviations: Step-to pattern;Narrow  base of support;Decreased stride length Gait velocity: reduced Gait velocity interpretation: Below normal speed for age/gender General Gait Details: sidesteps bedside due to pt being quite weak  Financial trader Rankin (Stroke Patients Only)       Balance Overall balance assessment: Needs assistance;History of Falls Sitting-balance support: Feet supported Sitting balance-Leahy Scale: Fair   Postural control: Posterior lean Standing balance support: Bilateral upper extremity supported Standing balance-Leahy Scale: Poor                               Pertinent Vitals/Pain Pain Assessment: Faces Faces Pain Scale: Hurts little more Pain Location: L hip and groin Pain Descriptors / Indicators: Aching;Sore Pain Intervention(s): Limited activity within patient's tolerance;Monitored during session;Repositioned    Home Living Family/patient expects to be discharged to:: Private residence Living Arrangements: Alone Available Help at Discharge: Family;Friend(s);Available PRN/intermittently;Personal care attendant (has care 4 hours a day) Type of Home: House Home Access: Stairs to enter     Home Layout: One level Home Equipment: Environmental consultant - 2 wheels;Cane - single point;Wheelchair - power Additional Comments: mainly using power chair for mobility lately    Prior Function Level of Independence: Needs assistance   Gait / Transfers Assistance Needed: power device and walks very short trips  ADL's / Homemaking Assistance Needed: caregiver 4 hours a day        Hand Dominance   Dominant  Hand: Right    Extremity/Trunk Assessment   Upper Extremity Assessment: Generalized weakness           Lower Extremity Assessment: Generalized weakness      Cervical / Trunk Assessment: Kyphotic  Communication   Communication: No difficulties (reports she has very impaired vision)  Cognition Arousal/Alertness: Awake/alert Behavior  During Therapy: WFL for tasks assessed/performed Overall Cognitive Status: Within Functional Limits for tasks assessed                      General Comments General comments (skin integrity, edema, etc.): Pt is able to get up to bedside but is noting her energy is low and has not been that active, mainly using power chair    Exercises        Assessment/Plan    PT Assessment Patient needs continued PT services  PT Diagnosis Acute pain;Difficulty walking   PT Problem List Decreased strength;Decreased range of motion;Decreased activity tolerance;Decreased balance;Decreased mobility;Decreased coordination;Decreased knowledge of use of DME;Decreased safety awareness;Decreased knowledge of precautions;Cardiopulmonary status limiting activity;Decreased skin integrity;Pain  PT Treatment Interventions DME instruction;Gait training;Functional mobility training;Therapeutic exercise;Therapeutic activities;Balance training;Neuromuscular re-education;Patient/family education   PT Goals (Current goals can be found in the Care Plan section) Acute Rehab PT Goals Patient Stated Goal: to get stronger PT Goal Formulation: With patient Time For Goal Achievement: 09/07/15 Potential to Achieve Goals: Good    Frequency Min 2X/week   Barriers to discharge Inaccessible home environment;Decreased caregiver support home alone with dependent mobility    Co-evaluation               End of Session Equipment Utilized During Treatment: Gait belt Activity Tolerance: Patient limited by pain;Patient limited by fatigue Patient left: in bed;with call bell/phone within reach;with bed alarm set Nurse Communication: Mobility status         Time: 9311-2162 PT Time Calculation (min) (ACUTE ONLY): 17 min   Charges:   PT Evaluation $PT Eval Moderate Complexity: 1 Procedure PT Treatments $Therapeutic Activity: 8-22 mins   PT G Codes:        Ramond Dial 09/03/15, 12:17 PM    Mee Hives,  PT MS Acute Rehab Dept. Number: [place] [facility] [place]

## 2015-08-24 NOTE — Progress Notes (Signed)
   08/24/15 1355  Vitals  Temp 99.8 F (37.7 C)  Temp Source Oral  BP (!) 94/39  MAP (mmHg) (!) 52  BP Location Left Arm  BP Method Automatic  Patient Position (if appropriate) Lying  Pulse Rate 83  Pulse Rate Source Monitor  Resp 18  Oxygen Therapy  SpO2 100 %  O2 Device Room Air    Dr. Darvin Neighbours made aware of pt's current BP.  New orders received.  Clarise Cruz, RN

## 2015-08-25 DIAGNOSIS — E440 Moderate protein-calorie malnutrition: Secondary | ICD-10-CM | POA: Insufficient documentation

## 2015-08-25 LAB — GLUCOSE, CAPILLARY
GLUCOSE-CAPILLARY: 145 mg/dL — AB (ref 65–99)
GLUCOSE-CAPILLARY: 108 mg/dL — AB (ref 65–99)
GLUCOSE-CAPILLARY: 129 mg/dL — AB (ref 65–99)
Glucose-Capillary: 110 mg/dL — ABNORMAL HIGH (ref 65–99)

## 2015-08-25 NOTE — Progress Notes (Signed)
[place]  [phone] [phone]  ID: Madison Fry OB: March 29, 1928  MR#: [id number]  NUU#:[id number]  Patient Care Team: Mayra Neer, MD as PCP - General  CHIEF COMPLAINT: Stage IV small cell lung cancer with multiple bony lesions.   INTERVAL HISTORY: Patient returns to clinic today for further evaluation and consideration of topotecan. She continues to have increased weakness and fatigue. She her appetite has improved. She has no neurologic complaints. She denies any recent fevers or illnesses. She denies any chest pain or shortness of breath. She denies any nausea, vomiting, constipation, or diarrhea. Patient otherwise feels well and offers no further specific complaints.   REVIEW OF SYSTEMS:   Review of Systems  Constitutional: Positive for malaise/fatigue. Negative for fever and weight loss.  Respiratory: Negative.  Negative for cough and shortness of breath.   Cardiovascular: Negative.  Negative for chest pain.  Gastrointestinal: Negative.   Genitourinary: Positive for flank pain.  Neurological: Positive for weakness.  Psychiatric/Behavioral: Negative.     As per HPI. Otherwise, a complete review of systems is negatve.  PAST MEDICAL HISTORY: Past Medical History:  Diagnosis Date  . Breast cancer ([place])   . Cancer [facility])    breast cancer, Right side lymph nodes removed  . Diabetes mellitus without complication ([place])   . Hyperlipidemia   . Hypertension   . Lung cancer ([place])     PAST SURGICAL HISTORY: Right colectomy for benign disease, right lumpectomy.  FAMILY HISTORY: Reviewed and unchanged. No reported history of malignancy or chronic disease.     ADVANCED DIRECTIVES:    HEALTH MAINTENANCE: Social History  Substance Use Topics  . Smoking status: Former Smoker    Packs/day: 1.50    Years: 40.00    Types: Cigarettes    Quit date: 08/27/1990  . Smokeless tobacco: Never Used  . Alcohol use No     Allergies    Allergen Reactions  . Shellfish Allergy Swelling    No current facility-administered medications for this visit.    No current outpatient prescriptions on file.   Facility-Administered Medications Ordered in Other Visits  Medication Dose Route Frequency Provider Last Rate Last Dose  . 0.9 %  sodium chloride infusion   Intravenous Continuous Lequita Asal, MD      . acetaminophen (TYLENOL) tablet 650 mg  650 mg Oral Q6H PRN Henreitta Leber, MD       Or  . acetaminophen (TYLENOL) suppository 650 mg  650 mg Rectal Q6H PRN Henreitta Leber, MD      . albuterol (PROVENTIL) (2.5 MG/3ML) 0.083% nebulizer solution 2.5 mg  2.5 mg Nebulization TID [name], MD   2.5 mg at 08/25/15 0750  . aspirin EC tablet 81 mg  81 mg Oral Daily Henreitta Leber, MD   81 mg at 08/24/15 0942  . brimonidine (ALPHAGAN) 0.2 % ophthalmic solution 1 drop  1 drop Both Eyes Q12H Henreitta Leber, MD   1 drop at 08/24/15 2017   And  . timolol (TIMOPTIC) 0.5 % ophthalmic solution 1 drop  1 drop Both Eyes Q12H Henreitta Leber, MD   1 drop at 08/24/15 2018  . cefTRIAXone (ROCEPHIN) 1 g in dextrose 5 % 50 mL IVPB  1 g Intravenous Q24H Henreitta Leber, MD   1 g at 08/24/15 2017  . colesevelam [facility]) tablet 625 mg  625 mg Oral BID WC Henreitta Leber, MD   625 mg at 08/24/15 1731  . enoxaparin (  LOVENOX) injection 40 mg  40 mg Subcutaneous Q24H Henreitta Leber, MD   40 mg at 08/24/15 2017  . ezetimibe (ZETIA) tablet 10 mg  10 mg Oral Daily Henreitta Leber, MD   10 mg at 08/24/15 0943  . feeding supplement (ENSURE ENLIVE) (ENSURE ENLIVE) liquid 237 mL  237 mL Oral BID BM Srikar Sudini, MD   237 mL at 08/24/15 1445  . glimepiride (AMARYL) tablet 1 mg  1 mg Oral Q breakfast Srikar Sudini, MD      . HYDROcodone-acetaminophen (NORCO/VICODIN) 5-325 MG per tablet 1-2 tablet  1-2 tablet Oral Q4H PRN Henreitta Leber, MD      . insulin aspart (novoLOG) injection 0-9 Units  0-9 Units Subcutaneous TID WC [name], MD   1 Units  at 08/24/15 1731  . latanoprost (XALATAN) 0.005 % ophthalmic solution 1 drop  1 drop Both Eyes QHS Henreitta Leber, MD   1 drop at 08/24/15 2018  . levothyroxine (SYNTHROID, LEVOTHROID) tablet 50 mcg  50 mcg Oral QAC breakfast Henreitta Leber, MD   50 mcg at 08/24/15 [phone]  . metFORMIN (GLUCOPHAGE-XR) 24 hr tablet 500 mg  500 mg Oral TID WC Henreitta Leber, MD   500 mg at 08/24/15 1730  . ondansetron (ZOFRAN) tablet 4 mg  4 mg Oral Q6H PRN Henreitta Leber, MD       Or  . ondansetron [facility]) injection 4 mg  4 mg Intravenous Q6H PRN Henreitta Leber, MD   4 mg at 08/23/15 2050  . verapamil (CALAN-SR) CR tablet 120 mg  120 mg Oral Daily Henreitta Leber, MD   120 mg at 08/24/15 0942  . zolpidem (AMBIEN) tablet 5 mg  5 mg Oral QHS PRN Henreitta Leber, MD        OBJECTIVE: Vitals:   08/22/15 1340  BP: 120/70  Pulse: 92  Resp: 18  Temp: (!) 95.3 F (35.2 C)     Body mass index is 23.68 kg/m.    ECOG FS:1 - Symptomatic but completely ambulatory  General: Well-developed, well-nourished, woman sitting comfortably in a wheelchair no acute distress. Lungs: Lungs clear to auscultation without rales, wheezes or rhonchi. Heart: Regular rate and rhythm. No rubs, murmurs, or gallops. Abdomen: Soft, nontender, nondistended. Active bowel sounds.  No hepatospleomegaly. Musculoskeletal: No edema, cyanosis, or clubbing. Neuro: Alert, answering all questions appropriately. Cranial nerves grossly intact. Skin: No rashes or petechiae noted. Extremities;  No edema. Neuro:  Appropriate. Psych: Normal affect.  LAB RESULTS:  Lab Results  Component Value Date   NA 131 (L) 08/24/2015   K 3.9 08/24/2015   CL 103 08/24/2015   CO2 21 (L) 08/24/2015   GLUCOSE 54 (L) 08/24/2015   BUN 15 08/24/2015   CREATININE 0.72 08/24/2015   CALCIUM 6.9 (L) 08/24/2015   PROT 6.3 (L) 08/23/2015   ALBUMIN 3.0 (L) 08/23/2015   AST 19 08/23/2015   ALT 7 (L) 08/23/2015   ALKPHOS 77 08/23/2015   BILITOT 0.6 08/23/2015    GFRNONAA >60 08/24/2015   GFRAA >60 08/24/2015    Lab Results  Component Value Date   WBC 8.0 08/24/2015   NEUTROABS 6.0 08/22/2015   HGB 8.7 (L) 08/24/2015   HCT 25.4 (L) 08/24/2015   MCV 90.4 08/24/2015   PLT 365 08/24/2015     STUDIES: Dg Chest 2 View  Result Date: 08/23/2015 CLINICAL DATA:  Small cell lung carcinoma.  Fall earlier today EXAM: CHEST  2 VIEW COMPARISON:  Chest CT July 12, 2015 FINDINGS: There is a moderate pleural effusion on the right. There is patchy airspace consolidation in the left upper lobe, stable. There is no well-defined mass or adenopathy by radiography. The heart size and pulmonary vascularity are normal. There is atherosclerotic calcification in the aortic arch. Bones are osteoporotic. No blastic or lytic bone lesions are identified. There is arthropathy in both shoulders and in the thoracic spine. IMPRESSION: Moderate pleural effusion on the right. Airspace consolidation right upper lobe, stable. No new opacity. Stable cardiac silhouette. No adenopathy evident. Bones osteoporotic. Aortic atherosclerosis. Electronically Signed   By: Lowella Grip III M.D.   On: 08/23/2015 14:54  Dg Hip Unilat With Pelvis 2-3 Views Left  Result Date: 08/24/2015 CLINICAL DATA:  Acute left hip pain following injury yesterday. Initial encounter. EXAM: DG HIP (WITH OR WITHOUT PELVIS) 2-3V LEFT COMPARISON:  08/17/2014 radiograph FINDINGS: There is no evidence of acute fracture or dislocation. Severe degenerative changes in both hips again noted. Right femoral head AVN with flattening again identified. Remote fractures of the superior and inferior left pubic rami noted. No suspicious focal bony lesions are present. Degenerative changes in the lower lumbar spine are again identified. IMPRESSION: No evidence of acute abnormality. Severe degenerative changes within both hips. Right femoral head AVN with flattening. Electronically Signed   By: Margarette Canada M.D.   On: 08/24/2015  13:21   ASSESSMENT: Stage Ia adenocarcinoma of the right breast, now with stage IV small cell lung cancer with multiple bony lesions.   PLAN:    1. Small cell lung cancer, stage IV with metastasis to the bone: Restaging CT scan from July 12, 2015 revealed a significant response to XRT with her thoracic mass. Patient continues to have persistent metastatic disease. MRI the brain did not reveal any intracranial abnormality. Patient wishes to proceed with single agent topotecan today. She last received treatment on August 08, 2015. Patient may only be able to tolerate treatment every other week. Patient last received Zometa on August 08, 2015. Return to clinic in 1 week for further evaluation and consideration of additional topotecan. 2. Breast cancer: Patient completed 5 years of tamoxifen, no further workup or intervention is needed at this time. 3. Osteoporosis: Continue calcium and vitamin D supplementation. Patient receives Zometa for her bone metastasis. 4. Hyponatremia: Mild. Likely secondary to underlying malignancy, monitor.  Discuss fluids with electrolytes and watching free water intake.  Discuss caloric intake. 5. Hypokalemia: Resolved. Continue oral supplementation as prescribed.  6. UTI: Urine cultures persistent with Escherichia coli. We will give an extended course of Bactrim 2 weeks and repeat urinalysis and culture in 1 week.  Patient expressed understanding and was in agreement with this plan. She also understands that She can call clinic at any time with any questions, concerns, or complaints.   Lloyd Huger, MD   08/25/2015 8:35 AM

## 2015-08-25 NOTE — Progress Notes (Signed)
[place] [facility] [place] NAME: Madison Fry    MR#:  [id number]  DATE OF BIRTH:  February 02, 1928  SUBJECTIVE:  CHIEF COMPLAINT:   Chief Complaint  Patient presents with  . Weakness   Continues to feel weak. Some better No dysuria or hematuria. Daughter at bedside Afebrile  REVIEW OF SYSTEMS:    Review of Systems  Constitutional: Positive for malaise/fatigue and weight loss. Negative for chills and fever.  HENT: Negative for sore throat.   Eyes: Negative for blurred vision, double vision and pain.  Respiratory: Negative for cough, hemoptysis, shortness of breath and wheezing.   Cardiovascular: Negative for chest pain, palpitations, orthopnea and leg swelling.  Gastrointestinal: Negative for abdominal pain, constipation, diarrhea, heartburn, nausea and vomiting.  Genitourinary: Negative for dysuria and hematuria.  Musculoskeletal: Positive for back pain and joint pain.  Skin: Negative for rash.  Neurological: Positive for weakness. Negative for sensory change, speech change, focal weakness and headaches.  Endo/Heme/Allergies: Does not bruise/bleed easily.  Psychiatric/Behavioral: Negative for depression. The patient is not nervous/anxious.     DRUG ALLERGIES:   Allergies  Allergen Reactions  . Shellfish Allergy Swelling    VITALS:  Blood pressure (!) 113/54, pulse 90, temperature 98.7 F (37.1 C), resp. rate 16, height '5\' 5"'$  (1.651 m), weight 73 kg (161 lb), SpO2 97 %.  PHYSICAL EXAMINATION:   Physical Exam  GENERAL:  82 y.o.-year-old patient lying in the bed with no acute distress.  EYES: Pupils equal, round, reactive to light and accommodation. No scleral icterus. Extraocular muscles intact.  HEENT: Head atraumatic, normocephalic. Oropharynx and nasopharynx clear.  NECK:  Supple, no jugular venous distention. No thyroid enlargement, no tenderness.  LUNGS: Normal breath sounds bilaterally, no wheezing, rales, rhonchi. No  use of accessory muscles of respiration.  CARDIOVASCULAR: S1, S2 normal. No murmurs, rubs, or gallops.  ABDOMEN: Soft, nontender, nondistended. Bowel sounds present. No organomegaly or mass.  EXTREMITIES: No cyanosis, clubbing or edema b/l.  Left hip tenderness. No shortening of the leg noticed.  NEUROLOGIC: Cranial nerves II through XII are intact. No focal Motor or sensory deficits b/l.   PSYCHIATRIC: The patient is alert and awake SKIN: No obvious rash, lesion, or ulcer.   LABORATORY PANEL:   CBC  Recent Labs Lab 08/24/15 0520  WBC 8.0  HGB 8.7*  HCT 25.4*  PLT 365   ------------------------------------------------------------------------------------------------------------------ Chemistries   Recent Labs Lab 08/23/15 1222 08/24/15 0520  NA 131* 131*  K 3.7 3.9  CL 102 103  CO2 20* 21*  GLUCOSE 146* 54*  BUN 15 15  CREATININE 0.96 0.72  CALCIUM 7.8* 6.9*  AST 19  --   ALT 7*  --   ALKPHOS 77  --   BILITOT 0.6  --    ------------------------------------------------------------------------------------------------------------------  Cardiac Enzymes  Recent Labs Lab 08/23/15 1222  TROPONINI <0.03   ------------------------------------------------------------------------------------------------------------------  RADIOLOGY:  Dg Chest 2 View  Result Date: 08/23/2015 CLINICAL DATA:  Small cell lung carcinoma.  Fall earlier today EXAM: CHEST  2 VIEW COMPARISON:  Chest CT July 12, 2015 FINDINGS: There is a moderate pleural effusion on the right. There is patchy airspace consolidation in the left upper lobe, stable. There is no well-defined mass or adenopathy by radiography. The heart size and pulmonary vascularity are normal. There is atherosclerotic calcification in the aortic arch. Bones are osteoporotic. No blastic or lytic bone lesions are identified. There is arthropathy in both shoulders and in the thoracic spine. IMPRESSION: Moderate  pleural effusion on the  right. Airspace consolidation right upper lobe, stable. No new opacity. Stable cardiac silhouette. No adenopathy evident. Bones osteoporotic. Aortic atherosclerosis. Electronically Signed   By: Lowella Grip III M.D.   On: 08/23/2015 14:54  Dg Hip Unilat With Pelvis 2-3 Views Left  Result Date: 08/24/2015 CLINICAL DATA:  Acute left hip pain following injury yesterday. Initial encounter. EXAM: DG HIP (WITH OR WITHOUT PELVIS) 2-3V LEFT COMPARISON:  08/17/2014 radiograph FINDINGS: There is no evidence of acute fracture or dislocation. Severe degenerative changes in both hips again noted. Right femoral head AVN with flattening again identified. Remote fractures of the superior and inferior left pubic rami noted. No suspicious focal bony lesions are present. Degenerative changes in the lower lumbar spine are again identified. IMPRESSION: No evidence of acute abnormality. Severe degenerative changes within both hips. Right femoral head AVN with flattening. Electronically Signed   By: Margarette Canada M.D.   On: 08/24/2015 13:21    ASSESSMENT AND PLAN:   82 year old female with past history of breast cancer, lung cancer with metastatic disease currently getting chemotherapy, hypertension, hyperlipidemia, hypothyroidism, glaucoma, diabetes who presented to the hospital due to weakness.  # Urinary tract infection, Escherichia coli Patient was treated with ciprofloxacin to which this strain is resistant. Started on IV ceftriaxone. Continue IV abx  # Generalized weakness Patient has been progressively declining with her cancer and ongoing chemotherapy. Now contributed by UTI. SNF at discharge  # Diabetes type 2  continue metformin Decrease dose of glipizide.  # Glaucoma continue Combigan, Travatan eyedrops.  # Essential hypertension continue  Verapamil. Discontinued losartan  # Hypothyroidism continue Synthroid.  # Anemia of chronic disease secondary to malignancy, ongoing  chemotherapy.  # Left pelvic fracture, old  All the records are reviewed and case discussed with Care Management/Social Workerr. Management plans discussed with the patient, family and they are in agreement.  CODE STATUS: FULL CODE  DVT Prophylaxis: SCDs  TOTAL TIME TAKING CARE OF THIS PATIENT: 30 minutes.   POSSIBLE D/C IN 1-2 DAYS, DEPENDING ON CLINICAL CONDITION.  [name] R M.D on 08/25/2015 at 9:08 AM  Between 7am to 6pm - Pager - [phone]  After 6pm go to [email or web address] - password [email or web address] [facility]  Office  [phone]  CC: Primary care physician; Mayra Neer, MD  Note: This dictation was prepared with Dragon dictation along with smaller phrase technology. Any transcriptional errors that result from this process are unintentional.

## 2015-08-26 LAB — GLUCOSE, CAPILLARY
GLUCOSE-CAPILLARY: 101 mg/dL — AB (ref 65–99)
GLUCOSE-CAPILLARY: 89 mg/dL (ref 65–99)
Glucose-Capillary: 83 mg/dL (ref 65–99)

## 2015-08-26 MED ORDER — GLIMEPIRIDE 1 MG PO TABS: 1.0000 mg | ORAL_TABLET | Freq: Every day | ORAL | Status: DC

## 2015-08-26 MED ORDER — METFORMIN HCL ER 500 MG PO TB24: 500.0000 mg | ORAL_TABLET | Freq: Two times a day (BID) | ORAL | Status: DC

## 2015-08-26 MED ORDER — CEPHALEXIN 250 MG PO CAPS: 250.0000 mg | ORAL_CAPSULE | Freq: Three times a day (TID) | ORAL | 0 refills | Status: DC

## 2015-08-26 MED ORDER — HYDROCODONE-ACETAMINOPHEN 5-325 MG PO TABS: 1.0000 | ORAL_TABLET | Freq: Four times a day (QID) | ORAL | 0 refills | Status: DC | PRN

## 2015-08-26 MED ORDER — LOSARTAN POTASSIUM 25 MG PO TABS: 25.0000 mg | ORAL_TABLET | Freq: Every day | ORAL | Status: DC

## 2015-08-26 NOTE — Progress Notes (Signed)
   08/26/15 1500  Clinical Encounter Type  Visited With Patient and family together  Visit Type Follow-up  Referral From Nurse  Consult/Referral To Chaplain  Spiritual Encounters  Spiritual Needs Literature  Stress Factors  Patient Stress Factors Health changes  Family Stress Factors Major life changes  Advance Directives (For Healthcare)  Does patient have an advance directive? Yes  Would patient like information on creating an advanced directive? Yes - Scientist, clinical (histocompatibility and immunogenetics) given  Type of Paramedic of [place];Living will  Does patient want to make changes to advanced directive? Yes - information given  Copy of advanced directive(s) in chart? Yes (Copy placed in chart & 2 copies provided to patient.)  Chap. [place] Ar and I assisted patient with completing AD documents. Provided original & 2 copies for patient's use. Placed a third copy in patient's chart. Chap. Libbie Bartley G. [place]

## 2015-08-26 NOTE — Consult Note (Signed)
   [facility] CM Inpatient Consult   08/26/2015  Madison Fry [birth year] [id number]   Patient screened for potential [facility] Management services. Patient is eligible for [facility]. Electronic medical record reveals patient's discharge plan is SNF. [facility] Care Management services not appropriate at this time. If patient's post hospital needs change please place a [facility] Care Management consult. For questions please contact:   Deniyah Dillavou RN, [facility] Liaison  [phone]) Business Mobile [phone]) Toll free office

## 2015-08-26 NOTE — Discharge Instructions (Signed)
Diabetic diet  Activity with assistance

## 2015-08-26 NOTE — NC FL2 (Signed)
[place] LEVEL OF CARE SCREENING TOOL     IDENTIFICATION  Patient Name: Madison Fry Birthdate: 11-21-1928 Sex: female Admission Date (Current Location): 08/23/2015  [place] and [place] Number:  Engineering geologist and Address:  [facility], [address], [place], [place] [address]      Provider Number: [id number]  Attending Physician Name and Address:  [name], MD  Relative Name and Phone Number:       Current Level of Care: Hospital Recommended Level of Care: [place] Prior Approval Number:    Date Approved/Denied:   PASRR Number:  [id number])  Discharge Plan: SNF    Current Diagnoses: Patient Active Problem List   Diagnosis Date Noted  . Malnutrition of moderate degree 08/25/2015  . Urinary tract infection 08/23/2015  . 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 Dyspnea 04/10/2015  . Acute anxiety 04/10/2015  . Leukocytosis 04/10/2015  . Hyponatremia 04/10/2015  . Type 2 diabetes mellitus ([place]) 04/10/2015  . UTI (lower urinary tract infection) 08/19/2014  . Hip pain, acute 08/17/2014  . Elevated troponin 08/17/2014    Orientation RESPIRATION BLADDER Height & Weight     Self, Place, Situation  Normal Continent Weight: 161 lb (73 kg) Height:  '5\' 5"'$  (165.1 cm)  BEHAVIORAL SYMPTOMS/MOOD NEUROLOGICAL BOWEL NUTRITION STATUS   (None)  (None) Continent Diet (Heart)  AMBULATORY STATUS COMMUNICATION OF NEEDS Skin   Extensive Assist Verbally Normal                       Personal Care Assistance Level of Assistance  Bathing, Feeding, Dressing Bathing Assistance: Limited assistance Feeding assistance: Independent Dressing Assistance: Limited assistance     Functional Limitations Info  Sight, Hearing, Speech Sight Info: Adequate Hearing Info: Adequate Speech Info: Adequate    SPECIAL CARE FACTORS FREQUENCY  PT (By licensed PT)     PT Frequency:  (5)               Contractures      Additional Factors Info  Code Status, Allergies, Insulin Sliding Scale Code Status Info:  (DNR) Allergies Info:  (Shellfish Allergy )   Insulin Sliding Scale Info:  (insulin aspart (novoLOG) injection 0-9 Units 0-9 Units, Subcutaneous, 3 times daily with meals  )       Current Medications (08/26/2015):  This is the current hospital active medication list Current Facility-Administered Medications  Medication Dose Route Frequency Provider Last Rate Last Dose  . acetaminophen (TYLENOL) tablet 650 mg  650 mg Oral Q6H PRN Henreitta Leber, MD       Or  . acetaminophen (TYLENOL) suppository 650 mg  650 mg Rectal Q6H PRN Henreitta Leber, MD      . albuterol (PROVENTIL) (2.5 MG/3ML) 0.083% nebulizer solution 2.5 mg  2.5 mg Nebulization TID [name], MD   2.5 mg at 08/26/15 0829  . aspirin EC tablet 81 mg  81 mg Oral Daily Henreitta Leber, MD   81 mg at 08/26/15 0732  . brimonidine (ALPHAGAN) 0.2 % ophthalmic solution 1 drop  1 drop Both Eyes Q12H Henreitta Leber, MD   1 drop at 08/26/15 0734   And  . timolol (TIMOPTIC) 0.5 % ophthalmic solution 1 drop  1 drop Both Eyes Q12H Henreitta Leber, MD   1 drop at 08/26/15 0730  . cefTRIAXone (ROCEPHIN) 1 g in dextrose 5 % 50 mL IVPB  1 g Intravenous Q24H Vivek  Gaetano Net, MD   1 g at 08/25/15 2108  . colesevelam [facility]) tablet 625 mg  625 mg Oral BID WC Henreitta Leber, MD   625 mg at 08/26/15 0732  . enoxaparin (LOVENOX) injection 40 mg  40 mg Subcutaneous Q24H Henreitta Leber, MD   40 mg at 08/25/15 2114  . ezetimibe (ZETIA) tablet 10 mg  10 mg Oral Daily Henreitta Leber, MD   10 mg at 08/26/15 0732  . feeding supplement (ENSURE ENLIVE) (ENSURE ENLIVE) liquid 237 mL  237 mL Oral BID BM Srikar Sudini, MD   237 mL at 08/25/15 1405  . glimepiride (AMARYL) tablet 1 mg  1 mg Oral Q breakfast [name], MD   1 mg at 08/26/15 0732  . HYDROcodone-acetaminophen (NORCO/VICODIN) 5-325 MG per tablet 1-2 tablet  1-2 tablet Oral Q4H  PRN Henreitta Leber, MD      . insulin aspart (novoLOG) injection 0-9 Units  0-9 Units Subcutaneous TID WC [name], MD   1 Units at 08/25/15 1214  . latanoprost (XALATAN) 0.005 % ophthalmic solution 1 drop  1 drop Both Eyes QHS Henreitta Leber, MD   1 drop at 08/25/15 2107  . levothyroxine (SYNTHROID, LEVOTHROID) tablet 50 mcg  50 mcg Oral QAC breakfast Henreitta Leber, MD   50 mcg at 08/26/15 0732  . metFORMIN (GLUCOPHAGE-XR) 24 hr tablet 500 mg  500 mg Oral TID WC Henreitta Leber, MD   500 mg at 08/26/15 0732  . ondansetron (ZOFRAN) tablet 4 mg  4 mg Oral Q6H PRN Henreitta Leber, MD       Or  . ondansetron [facility]) injection 4 mg  4 mg Intravenous Q6H PRN Henreitta Leber, MD   4 mg at 08/23/15 2050  . verapamil (CALAN-SR) CR tablet 120 mg  120 mg Oral Daily Henreitta Leber, MD   120 mg at 08/26/15 0731  . zolpidem (AMBIEN) tablet 5 mg  5 mg Oral QHS PRN Henreitta Leber, MD       Facility-Administered Medications Ordered in Other Encounters  Medication Dose Route Frequency Provider Last Rate Last Dose  . 0.9 %  sodium chloride infusion   Intravenous Continuous Lequita Asal, MD         Discharge Medications: Please see discharge summary for a list of discharge medications.  Relevant Imaging Results:  Relevant Lab Results:   Additional Information  (SSN [id number])  Lorenso Quarry Damaris Abeln, LCSW

## 2015-08-26 NOTE — Clinical Social Work Note (Signed)
Clinical Social Work Assessment  Patient Details  Name: Madison Fry MRN: [id number] Date of Birth: 1928-11-20  Date of referral:  08/26/15               Reason for consult:  Discharge Planning                Permission sought to share information with:  Family Supports Permission granted to share information::  Yes, Verbal Permission Granted  Name::        Agency::     Relationship::   Neoma Laming- Daughter )  Contact Information:     Housing/Transportation Living arrangements for the past 2 months:  Single Family Home Source of Information:  Patient, Adult Children Patient Interpreter Needed:  None Criminal Activity/Legal Involvement Pertinent to Current Situation/Hospitalization:  No - Comment as needed Significant Relationships:  Adult Children, Other Family Members Lives with:  Self Do you feel safe going back to the place where you live?  Yes Need for family participation in patient care:  Yes (Comment) (Daughter)  Care giving concerns:  PT recommends that patient will benefit from SNF placement at discharge.    Social Worker assessment / plan:  CSW met with patient and her daughter at bedside. CSW introduced herself and her role. Granted CSW verbal permission to speak about discharge plans in front of her daughter. Patient reported that she is open to going to STR at discharge. Reported that she would like to go to [facility] or [facility]. Informed by MD that patient will possibly discharge today. FL2/ PASRR completed and placed in MD's basket.   CSW presented bed offers. Patient accepted bed at [facility]. CSW informed Doug- Admissions at [facility]. CSW contacted Amy with [facility] and informed her of above. Awaiting insurance auth. CSW will continue to follow and assist.   Employment status:  Retired Nurse, adult PT Recommendations:  [place] / Referral to community resources:  [place]  Patient/Family's Response to care:  Patient and her family are in agreement with plan for patient to go to SNF.   Patient/Family's Understanding of and Emotional Response to Diagnosis, Current Treatment, and Prognosis:  Patient and her family report they understand patient's Diagnosis, Current Treatment, and Prognosis and are appreciative of CSW assistance.   Emotional Assessment Appearance:  Appears stated age Attitude/Demeanor/Rapport:   (None) Affect (typically observed):  Accepting, Calm, Pleasant Orientation:  Oriented to Self, Oriented to Place Alcohol / Substance use:  Not Applicable Psych involvement (Current and /or in the community):  No (Comment)  Discharge Needs  Concerns to be addressed:  Discharge Planning Concerns Readmission within the last 30 days:  No Current discharge risk:  None Barriers to Discharge:  No Barriers Identified   [place], LCSW 08/26/2015, 11:18 AM

## 2015-08-26 NOTE — Clinical Social Work Placement (Signed)
   CLINICAL SOCIAL WORK PLACEMENT  NOTE  Date:  08/26/2015  Patient Details  Name: Madison Fry MRN: [id number] Date of Birth: 1928-10-15  Clinical Social Work is seeking post-discharge placement for this patient at the [place] level of care (*CSW will initial, date and re-position this form in  chart as items are completed):  Yes   Patient/family provided with [place] Work Department's list of facilities offering this level of care within the geographic area requested by the patient (or if unable, by the patient's family).  Yes   Patient/family informed of their freedom to choose among providers that offer the needed level of care, that participate in Medicare, Medicaid or managed care program needed by the patient, have an available bed and are willing to accept the patient.  Yes   Patient/family informed of [facility]'s ownership interest in [facility] and [facility], as well as of the fact that they are under no obligation to receive care at these facilities.  PASRR submitted to EDS on 08/26/15     PASRR number received on 08/26/15     Existing PASRR number confirmed on       FL2 transmitted to all facilities in geographic area requested by pt/family on 08/26/15     FL2 transmitted to all facilities within larger geographic area on       Patient informed that his/her managed care company has contracts with or will negotiate with certain facilities, including the following:        Yes   Patient/family informed of bed offers received.  Patient chooses bed at  [facility])     Physician recommends and patient chooses bed at      Patient to be transferred to  ([facility]) on 08/26/15.  Patient to be transferred to facility by  (Daughter)     Patient family notified on 08/26/15 of transfer.  Name of family member notified:   (Daughter)     PHYSICIAN       Additional Comment:     _______________________________________________ Baldemar Lenis, LCSW 08/26/2015, 3:42 PM

## 2015-08-26 NOTE — Progress Notes (Addendum)
Physical Therapy Treatment Patient Details Name: Madison Fry MRN: [id number] DOB: 06-Mar-1928 Today's Date: 08/26/2015    History of Present Illness 82 yo female with onset of breast CA with lung mets and in active chemotherapy has been weak and noting L hip pain since the last chemotherapy visit a few days ago.  Pt reports sittting down on the legrests in a short fall from her power wc and may have injured L hip    PT Comments    Pt agreeable to PT; denies pain. Pt demonstrating improvements in all bed mobility, transfers and ambulation requiring less assist and improved steadiness. Pt participates well with long sit and seated exercises for 1 set of 10 repetitions. Pt does fatigue quickly. Pt tolerating up in chair and encouraged to do so through lunch. Continue PT to progress strength, endurance to improve all functional mobility.   Follow Up Recommendations  SNF     Equipment Recommendations       Recommendations for Other Services       Precautions / Restrictions Precautions Precautions: Fall Restrictions Weight Bearing Restrictions: No    Mobility  Bed Mobility Overal bed mobility: Modified Independent Bed Mobility: Supine to Sit     Supine to sit: Modified independent (Device/Increase time)     General bed mobility comments: Increased time/effort  Transfers Overall transfer level: Needs assistance Equipment used: Rolling walker (2 wheeled);1 person hand held assist Transfers: Sit to/from Stand Sit to Stand: Min guard         General transfer comment: Cues for proper/safe hand placement. Stand well without LOB.   Ambulation/Gait Ambulation/Gait assistance: Min guard Ambulation Distance (Feet): 5 Feet Assistive device: Rolling walker (2 wheeled);1 person hand held assist Gait Pattern/deviations: Step-through pattern;Trunk flexed (partial step through/and sidesteps) Gait velocity: reduced Gait velocity interpretation: <1.8 ft/sec, indicative of risk for  recurrent falls General Gait Details: Tolerates small steps bed to chair without LOB. Refuses further ambulation due to faitgue   Stairs            Wheelchair Mobility    Modified Rankin (Stroke Patients Only)       Balance Overall balance assessment: Needs assistance Sitting-balance support: Feet supported Sitting balance-Leahy Scale: Good     Standing balance support: Bilateral upper extremity supported Standing balance-Leahy Scale: Fair                      Cognition Arousal/Alertness: Awake/alert Behavior During Therapy: WFL for tasks assessed/performed Overall Cognitive Status: Within Functional Limits for tasks assessed                      Exercises General Exercises - Lower Extremity Ankle Circles/Pumps: AROM;Both;10 reps Quad Sets: Strengthening;Both;10 reps Gluteal Sets: Strengthening;Both;10 reps Long Arc Quad: AROM;Both;10 reps;Seated Heel Slides: AROM;Both;10 reps Hip ABduction/ADduction: AROM;Both;10 reps Hip Flexion/Marching: AROM;Both;20 reps;Seated Other Exercises Other Exercises: isometrics, abd and HS performed long sit    General Comments        Pertinent Vitals/Pain Pain Assessment: No/denies pain    Home Living                      Prior Function            PT Goals (current goals can now be found in the care plan section) Progress towards PT goals: Progressing toward goals    Frequency  Min 2X/week    PT Plan Current plan remains appropriate    Co-evaluation  End of Session   Activity Tolerance: Patient tolerated treatment well;Patient limited by fatigue Patient left: in chair;with call bell/phone within reach;with chair alarm set     Time: 1110-1134 PT Time Calculation (min) (ACUTE ONLY): 24 min  Charges:  $Gait Training: 8-22 mins $Therapeutic Exercise: 8-22 mins                    G Codes:      Charlaine Dalton, PTA 08/26/2015, 1:27 PM

## 2015-08-26 NOTE — Discharge Summary (Signed)
[facility] [place] NAME: Madison Fry    MR#:  [id number]  DATE OF BIRTH:  22-Jun-1928  DATE OF ADMISSION:  08/23/2015 ADMITTING PHYSICIAN: Henreitta Leber, MD  DATE OF DISCHARGE: No discharge date for patient encounter.  PRIMARY CARE PHYSICIAN: SHAW,KIMBERLEE, MD   ADMISSION DIAGNOSIS:  Weakness [R53.1] UTI (lower urinary tract infection) [N39.0]  DISCHARGE DIAGNOSIS:  Active Problems:   Urinary tract infection   Malnutrition of moderate degree   SECONDARY DIAGNOSIS:   Past Medical History:  Diagnosis Date  . Breast cancer ([place])   . Cancer [facility])    breast cancer, Right side lymph nodes removed  . Diabetes mellitus without complication ([place])   . Hyperlipidemia   . Hypertension   . Lung cancer [facility])      ADMITTING HISTORY  Madison Fry  is a 82 y.o. female with a known history of Breast cancer, metastatic lung cancer, history of previous UTI, diabetes, hypertension, hyperlipidemia presents to the hospital due to generalized weakness and lethargy. Patient says that she has been feeling more weak than usual. She did have chemotherapy a few days back and since then has been having some diarrhea and feeling weak. She is also had a recent urinary tract infection for which she was treated with multiple lines of antibiotics without much improvement in his symptoms. She was recently started on Bactrim only a couple days back. Prior to that she was on ciprofloxacin and it seems that the Escherichia coli was resistant to it. She now presents to the hospital due to persistent symptoms of weakness which could be multifactorial related to underlying chemotherapy, GERD with underlying UTI having failed oral antibiotics and therefore hospitalist services were contacted further treatment and evaluation.   HOSPITAL COURSE:   82 year old female with past history of breast cancer, lung cancer with metastatic disease currently getting  chemotherapy, hypertension, hyperlipidemia, hypothyroidism, glaucoma, diabetes who presented to the hospital due to weakness.  # Urinary tract infection, Escherichia coli Patient was treated with ciprofloxacin to which this strain is resistant. Started on IV ceftriaxone. Continued IV abx. Change to keflex at discharge for 1 week  # Generalized weakness Patient has been progressively declining with her cancer and ongoing chemotherapy. Now contributed by UTI. SNF at discharge  # Diabetes type 2  continue metformin Decreased dose of glipizide to 1 mg daily. Blood sugars- good control  # Glaucoma continue Combigan, Travatan eyedrops.  # Essential hypertension continue  Verapamil. Reduced dose of losartan  # Hypothyroidism continue Synthroid.  # Anemia of chronic disease secondary to malignancy, ongoing chemotherapy.  # Left pelvic fracture, old  stable for discharge to SNF  CONSULTS OBTAINED:    DRUG ALLERGIES:   Allergies  Allergen Reactions  . Shellfish Allergy Swelling    DISCHARGE MEDICATIONS:   Current Discharge Medication List    START taking these medications   Details  cephALEXin (KEFLEX) 250 MG capsule Take 1 capsule (250 mg total) by mouth 3 (three) times daily. Qty: 21 capsule, Refills: 0      CONTINUE these medications which have CHANGED   Details  glimepiride (AMARYL) 1 MG tablet Take 1 tablet (1 mg total) by mouth daily with breakfast.    HYDROcodone-acetaminophen (NORCO/VICODIN) 5-325 MG tablet Take 1 tablet by mouth every 6 (six) hours as needed for severe pain. Qty: 20 tablet, Refills: 0    losartan (COZAAR) 25 MG tablet Take 1 tablet (25 mg total) by mouth daily.  metFORMIN (GLUCOPHAGE-XR) 500 MG 24 hr tablet Take 1 tablet (500 mg total) by mouth 2 (two) times daily.      CONTINUE these medications which have NOT CHANGED   Details  albuterol (PROVENTIL) (2.5 MG/3ML) 0.083% nebulizer solution Take 3 mLs (2.5 mg total) by  nebulization 3 (three) times daily. Qty: 252 mL, Refills: 3    aspirin EC 81 MG tablet Take 81 mg by mouth daily.    colesevelam (WELCHOL) 625 MG tablet Take by mouth 2 (two) times daily with a meal.    COMBIGAN 0.2-0.5 % ophthalmic solution PUT 1 DROP IN AFFECTED EYE EVERY 12 HOURS**NEED OFFICE VISIT** Refills: 0    ezetimibe (ZETIA) 10 MG tablet Take 10 mg by mouth daily.    levothyroxine (SYNTHROID, LEVOTHROID) 50 MCG tablet Take 50 mcg by mouth daily.    TRAVATAN Z 0.004 % SOLN ophthalmic solution PUT 1 DROP INTO BOTH EYES AT BEDTIME Refills: 0    verapamil (CALAN-SR) 120 MG CR tablet Take 120 mg by mouth daily.    zolpidem (AMBIEN) 5 MG tablet Take 1 tablet (5 mg total) by mouth at bedtime as needed for sleep. Qty: 30 tablet, Refills: 0      STOP taking these medications     sulfamethoxazole-trimethoprim (BACTRIM DS,SEPTRA DS) 800-160 MG tablet         Today   VITAL SIGNS:  Blood pressure (!) 132/55, pulse 83, temperature 98 F (36.7 C), temperature source Oral, resp. rate 18, height '5\' 5"'$  (1.651 m), weight 73 kg (161 lb), SpO2 95 %.  I/O:   Intake/Output Summary (Last 24 hours) at 08/26/15 1114 Last data filed at 08/26/15 0900  Gross per 24 hour  Intake              960 ml  Output              350 ml  Net              610 ml    PHYSICAL EXAMINATION:  Physical Exam  GENERAL:  82 y.o.-year-old patient lying in the bed with no acute distress.  LUNGS: Normal breath sounds bilaterally, no wheezing, rales,rhonchi or crepitation. No use of accessory muscles of respiration.  CARDIOVASCULAR: S1, S2 normal. No murmurs, rubs, or gallops.  ABDOMEN: Soft, non-tender, non-distended. Bowel sounds present. No organomegaly or mass.  NEUROLOGIC: Moves all 4 extremities. PSYCHIATRIC: The patient is alert and awake  DATA REVIEW:   CBC  Recent Labs Lab 08/24/15 0520  WBC 8.0  HGB 8.7*  HCT 25.4*  PLT 365    Chemistries   Recent Labs Lab 08/23/15 1222  08/24/15 0520  NA 131* 131*  K 3.7 3.9  CL 102 103  CO2 20* 21*  GLUCOSE 146* 54*  BUN 15 15  CREATININE 0.96 0.72  CALCIUM 7.8* 6.9*  AST 19  --   ALT 7*  --   ALKPHOS 77  --   BILITOT 0.6  --     Cardiac Enzymes  Recent Labs Lab 08/23/15 1222  TROPONINI <0.03    Microbiology Results  Results for orders placed or performed in visit on 08/19/15  Urine culture     Status: Abnormal   Collection Time: 08/19/15  1:04 PM  Result Value Ref Range Status   Specimen Description URINE, CLEAN CATCH  Final   Special Requests NONE  Final   Culture >=100,000 COLONIES/mL ESCHERICHIA COLI (A)  Final   Report Status 08/21/2015 FINAL  Final  Organism ID, Bacteria ESCHERICHIA COLI (A)  Final      Susceptibility   Escherichia coli - MIC*    AMPICILLIN 16 INTERMEDIATE Intermediate     CEFAZOLIN <=4 SENSITIVE Sensitive     CEFTRIAXONE <=1 SENSITIVE Sensitive     CIPROFLOXACIN >=4 RESISTANT Resistant     GENTAMICIN <=1 SENSITIVE Sensitive     IMIPENEM <=0.25 SENSITIVE Sensitive     NITROFURANTOIN <=16 SENSITIVE Sensitive     TRIMETH/SULFA <=20 SENSITIVE Sensitive     AMPICILLIN/SULBACTAM 4 SENSITIVE Sensitive     PIP/TAZO <=4 SENSITIVE Sensitive     Extended ESBL NEGATIVE Sensitive     * >=100,000 COLONIES/mL ESCHERICHIA COLI    RADIOLOGY:  Dg Hip Unilat With Pelvis 2-3 Views Left  Result Date: 08/24/2015 CLINICAL DATA:  Acute left hip pain following injury yesterday. Initial encounter. EXAM: DG HIP (WITH OR WITHOUT PELVIS) 2-3V LEFT COMPARISON:  08/17/2014 radiograph FINDINGS: There is no evidence of acute fracture or dislocation. Severe degenerative changes in both hips again noted. Right femoral head AVN with flattening again identified. Remote fractures of the superior and inferior left pubic rami noted. No suspicious focal bony lesions are present. Degenerative changes in the lower lumbar spine are again identified. IMPRESSION: No evidence of acute abnormality. Severe degenerative  changes within both hips. Right femoral head AVN with flattening. Electronically Signed   By: Margarette Canada M.D.   On: 08/24/2015 13:21   Follow up with PCP in 1 week.  Management plans discussed with the patient, family and they are in agreement.  CODE STATUS:     Code Status Orders        Start     Ordered   08/23/15 2022  Do not attempt resuscitation (DNR)  Continuous    Question Answer Comment  In the event of cardiac or respiratory ARREST Do not call a "code blue"   In the event of cardiac or respiratory ARREST Do not perform Intubation, CPR, defibrillation or ACLS   In the event of cardiac or respiratory ARREST Use medication by any route, position, wound care, and other measures to relive pain and suffering. May use oxygen, suction and manual treatment of airway obstruction as needed for comfort.      08/23/15 2021    Code Status History    Date Active Date Inactive Code Status Order ID Comments User Context   04/10/2015  9:47 PM 04/12/2015  4:42 PM Full Code [id number]  Vianne Bulls, MD ED   08/17/2014 12:22 PM 08/20/2014  3:56 PM Full Code [id number]  Nicholes Mango, MD Inpatient      TOTAL TIME TAKING CARE OF THIS PATIENT ON DAY OF DISCHARGE: more than 30 minutes.   [name] R M.D on 08/26/2015 at 11:14 AM  Between 7am to 6pm - Pager - [phone]  After 6pm go to [email or web address] - password [email or web address] [facility]  Office  [phone]  CC: Primary care physician; Mayra Neer, MD  Note: This dictation was prepared with Dragon dictation along with smaller phrase technology. Any transcriptional errors that result from this process are unintentional.

## 2015-08-26 NOTE — Progress Notes (Signed)
CSW received Madison Fry [id number]. Clinical Social Worker was informed that patient will be medically ready to discharge to [facility]. Patient in a agreement with plan. CSW called Doug- Admissions Coordinator at [facility] to confirm that patient's bed is ready. Provided patient's room number 406 and number to call for report [phone]. All discharge information faxed to [facility] via [employer]. DNR added to discharge packet. RN will call report and patient will discharge to [facility] via family transport.  Ernest Pine, MSW, LCSW, [place] Clinical Social Worker [phone]

## 2015-08-29 ENCOUNTER — Inpatient Hospital Stay: Payer: Commercial Managed Care - HMO | Admitting: Oncology

## 2015-08-29 ENCOUNTER — Inpatient Hospital Stay: Payer: Commercial Managed Care - HMO

## 2015-09-02 ENCOUNTER — Encounter: Payer: Self-pay | Admitting: Emergency Medicine

## 2015-09-02 ENCOUNTER — Inpatient Hospital Stay
Admission: EM | Admit: 2015-09-02 | Discharge: 2015-09-05 | DRG: 871 | Disposition: A | Discharge status: Skilled Nursing Facility | Payer: Commercial Managed Care - HMO | Attending: Internal Medicine | Admitting: Internal Medicine

## 2015-09-02 ENCOUNTER — Other Ambulatory Visit: Payer: Self-pay

## 2015-09-02 DIAGNOSIS — C3490 Malignant neoplasm of unspecified part of unspecified bronchus or lung: Secondary | ICD-10-CM | POA: Diagnosis present

## 2015-09-02 DIAGNOSIS — E119 Type 2 diabetes mellitus without complications: Secondary | ICD-10-CM | POA: Diagnosis present

## 2015-09-02 DIAGNOSIS — Z7984 Long term (current) use of oral hypoglycemic drugs: Secondary | ICD-10-CM

## 2015-09-02 DIAGNOSIS — E46 Unspecified protein-calorie malnutrition: Secondary | ICD-10-CM | POA: Diagnosis present

## 2015-09-02 DIAGNOSIS — E785 Hyperlipidemia, unspecified: Secondary | ICD-10-CM | POA: Diagnosis present

## 2015-09-02 DIAGNOSIS — Z79899 Other long term (current) drug therapy: Secondary | ICD-10-CM | POA: Diagnosis not present

## 2015-09-02 DIAGNOSIS — Z853 Personal history of malignant neoplasm of breast: Secondary | ICD-10-CM | POA: Diagnosis not present

## 2015-09-02 DIAGNOSIS — E1100 Type 2 diabetes mellitus with hyperosmolarity without nonketotic hyperglycemic-hyperosmolar coma (NKHHC): Secondary | ICD-10-CM

## 2015-09-02 DIAGNOSIS — L8990 Pressure ulcer of unspecified site, unspecified stage: Secondary | ICD-10-CM | POA: Insufficient documentation

## 2015-09-02 DIAGNOSIS — Z6826 Body mass index (BMI) 26.0-26.9, adult: Secondary | ICD-10-CM

## 2015-09-02 DIAGNOSIS — E222 Syndrome of inappropriate secretion of antidiuretic hormone: Secondary | ICD-10-CM | POA: Diagnosis present

## 2015-09-02 DIAGNOSIS — E039 Hypothyroidism, unspecified: Secondary | ICD-10-CM | POA: Diagnosis present

## 2015-09-02 DIAGNOSIS — Z66 Do not resuscitate: Secondary | ICD-10-CM

## 2015-09-02 DIAGNOSIS — Z9221 Personal history of antineoplastic chemotherapy: Secondary | ICD-10-CM

## 2015-09-02 DIAGNOSIS — I959 Hypotension, unspecified: Secondary | ICD-10-CM

## 2015-09-02 DIAGNOSIS — R197 Diarrhea, unspecified: Secondary | ICD-10-CM | POA: Diagnosis present

## 2015-09-02 DIAGNOSIS — Z85118 Personal history of other malignant neoplasm of bronchus and lung: Secondary | ICD-10-CM | POA: Diagnosis not present

## 2015-09-02 DIAGNOSIS — C7951 Secondary malignant neoplasm of bone: Secondary | ICD-10-CM | POA: Diagnosis present

## 2015-09-02 DIAGNOSIS — B965 Pseudomonas (aeruginosa) (mallei) (pseudomallei) as the cause of diseases classified elsewhere: Secondary | ICD-10-CM | POA: Diagnosis present

## 2015-09-02 DIAGNOSIS — Z808 Family history of malignant neoplasm of other organs or systems: Secondary | ICD-10-CM

## 2015-09-02 DIAGNOSIS — I10 Essential (primary) hypertension: Secondary | ICD-10-CM | POA: Diagnosis present

## 2015-09-02 DIAGNOSIS — D649 Anemia, unspecified: Secondary | ICD-10-CM | POA: Diagnosis present

## 2015-09-02 DIAGNOSIS — G9341 Metabolic encephalopathy: Secondary | ICD-10-CM

## 2015-09-02 DIAGNOSIS — Z87891 Personal history of nicotine dependence: Secondary | ICD-10-CM | POA: Diagnosis not present

## 2015-09-02 DIAGNOSIS — B9620 Unspecified Escherichia coli [E. coli] as the cause of diseases classified elsewhere: Secondary | ICD-10-CM | POA: Diagnosis present

## 2015-09-02 DIAGNOSIS — Z7982 Long term (current) use of aspirin: Secondary | ICD-10-CM

## 2015-09-02 DIAGNOSIS — H409 Unspecified glaucoma: Secondary | ICD-10-CM | POA: Diagnosis present

## 2015-09-02 DIAGNOSIS — R55 Syncope and collapse: Secondary | ICD-10-CM

## 2015-09-02 DIAGNOSIS — A419 Sepsis, unspecified organism: Principal | ICD-10-CM | POA: Diagnosis present

## 2015-09-02 DIAGNOSIS — E876 Hypokalemia: Secondary | ICD-10-CM

## 2015-09-02 DIAGNOSIS — Z923 Personal history of irradiation: Secondary | ICD-10-CM

## 2015-09-02 DIAGNOSIS — Z823 Family history of stroke: Secondary | ICD-10-CM | POA: Diagnosis not present

## 2015-09-02 DIAGNOSIS — N390 Urinary tract infection, site not specified: Secondary | ICD-10-CM | POA: Diagnosis present

## 2015-09-02 DIAGNOSIS — E871 Hypo-osmolality and hyponatremia: Secondary | ICD-10-CM | POA: Diagnosis present

## 2015-09-02 DIAGNOSIS — Z789 Other specified health status: Secondary | ICD-10-CM | POA: Diagnosis not present

## 2015-09-02 DIAGNOSIS — A498 Other bacterial infections of unspecified site: Secondary | ICD-10-CM

## 2015-09-02 DIAGNOSIS — Z515 Encounter for palliative care: Secondary | ICD-10-CM

## 2015-09-02 DIAGNOSIS — Z452 Encounter for adjustment and management of vascular access device: Secondary | ICD-10-CM

## 2015-09-02 HISTORY — DX: Disorder of thyroid, unspecified: E07.9

## 2015-09-02 HISTORY — DX: Anemia, unspecified: D64.9

## 2015-09-02 HISTORY — DX: Unspecified glaucoma: H40.9

## 2015-09-02 LAB — URINALYSIS COMPLETE WITH MICROSCOPIC (ARMC ONLY)
BACTERIA UA: NONE SEEN
Bilirubin Urine: NEGATIVE
Glucose, UA: NEGATIVE mg/dL
Ketones, ur: NEGATIVE mg/dL
NITRITE: NEGATIVE
PROTEIN: NEGATIVE mg/dL
SPECIFIC GRAVITY, URINE: 1.011 (ref 1.005–1.030)
pH: 7 (ref 5.0–8.0)

## 2015-09-02 LAB — BASIC METABOLIC PANEL
ANION GAP: 8 (ref 5–15)
BUN: 8 mg/dL (ref 6–20)
CHLORIDE: 80 mmol/L — AB (ref 101–111)
CO2: 23 mmol/L (ref 22–32)
Calcium: 7.9 mg/dL — ABNORMAL LOW (ref 8.9–10.3)
Creatinine, Ser: 0.45 mg/dL (ref 0.44–1.00)
GFR calc Af Amer: >60 mL/min (ref >60)
GLUCOSE: 117 mg/dL — AB (ref 65–99)
POTASSIUM: 4 mmol/L (ref 3.5–5.1)
Sodium: 111 mmol/L — CL (ref 135–145)

## 2015-09-02 LAB — GLUCOSE, CAPILLARY
GLUCOSE-CAPILLARY: 138 mg/dL — AB (ref 65–99)
Glucose-Capillary: 108 mg/dL — ABNORMAL HIGH (ref 65–99)
Glucose-Capillary: 115 mg/dL — ABNORMAL HIGH (ref 65–99)

## 2015-09-02 LAB — CBC
HEMATOCRIT: 27.5 % — AB (ref 35.0–47.0)
HEMOGLOBIN: 9.8 g/dL — AB (ref 12.0–16.0)
MCH: 31.1 pg (ref 26.0–34.0)
MCHC: 35.6 g/dL (ref 32.0–36.0)
MCV: 87.5 fL (ref 80.0–100.0)
Platelets: 269 10*3/uL (ref 150–440)
RBC: 3.14 MIL/uL — ABNORMAL LOW (ref 3.80–5.20)
RDW: 17 % — ABNORMAL HIGH (ref 11.5–14.5)
WBC: 4.6 10*3/uL (ref 3.6–11.0)

## 2015-09-02 LAB — C DIFFICILE QUICK SCREEN W PCR REFLEX
C DIFFICLE (CDIFF) ANTIGEN: NEGATIVE
C Diff interpretation: NOT DETECTED
C Diff toxin: NEGATIVE

## 2015-09-02 LAB — MRSA PCR SCREENING: MRSA BY PCR: NEGATIVE

## 2015-09-02 MED ORDER — METFORMIN HCL ER 500 MG PO TB24
500.0000 mg | ORAL_TABLET | Freq: Two times a day (BID) | ORAL | Status: DC
  Administered 2015-09-02 – 2015-09-05 (×6): 500 mg via ORAL
  Filled 2015-09-02 (×6): qty 1

## 2015-09-02 MED ORDER — SODIUM CHLORIDE 0.9% FLUSH
3.0000 mL | Freq: Two times a day (BID) | INTRAVENOUS | Status: DC
  Administered 2015-09-03 – 2015-09-05 (×5): 3 mL via INTRAVENOUS

## 2015-09-02 MED ORDER — GLIMEPIRIDE 2 MG PO TABS
2.0000 mg | ORAL_TABLET | Freq: Every day | ORAL | Status: DC
  Administered 2015-09-03 – 2015-09-05 (×3): 2 mg via ORAL
  Filled 2015-09-02 (×3): qty 1

## 2015-09-02 MED ORDER — VERAPAMIL HCL ER 120 MG PO TBCR
120.0000 mg | EXTENDED_RELEASE_TABLET | Freq: Every day | ORAL | Status: DC
  Administered 2015-09-03 – 2015-09-05 (×3): 120 mg via ORAL
  Filled 2015-09-02 (×3): qty 1

## 2015-09-02 MED ORDER — HYDROCODONE-ACETAMINOPHEN 5-325 MG PO TABS
1.0000 | ORAL_TABLET | Freq: Four times a day (QID) | ORAL | Status: DC | PRN
Start: 2015-09-02 — End: 2015-09-03
  Administered 2015-09-03: 1 via ORAL
  Filled 2015-09-02: qty 1

## 2015-09-02 MED ORDER — BRIMONIDINE TARTRATE 0.2 % OP SOLN
1.0000 [drp] | Freq: Two times a day (BID) | OPHTHALMIC | Status: DC
  Administered 2015-09-02 – 2015-09-05 (×6): 1 [drp] via OPHTHALMIC
  Filled 2015-09-02: qty 5

## 2015-09-02 MED ORDER — ACETAMINOPHEN 325 MG PO TABS
650.0000 mg | ORAL_TABLET | Freq: Four times a day (QID) | ORAL | Status: DC | PRN
  Administered 2015-09-04 – 2015-09-05 (×2): 650 mg via ORAL
  Filled 2015-09-02 (×2): qty 2

## 2015-09-02 MED ORDER — LEVOTHYROXINE SODIUM 50 MCG PO TABS
50.0000 ug | ORAL_TABLET | Freq: Every day | ORAL | Status: DC
  Administered 2015-09-03 – 2015-09-05 (×3): 50 ug via ORAL
  Filled 2015-09-02 (×3): qty 1

## 2015-09-02 MED ORDER — ACETAMINOPHEN 650 MG RE SUPP: 650.0000 mg | Freq: Four times a day (QID) | RECTAL | Status: DC | PRN

## 2015-09-02 MED ORDER — SODIUM CHLORIDE 0.9 % IV SOLN
INTRAVENOUS | Status: DC
  Administered 2015-09-02 – 2015-09-03 (×2): via INTRAVENOUS

## 2015-09-02 MED ORDER — LORAZEPAM 0.5 MG PO TABS: 0.5000 mg | ORAL_TABLET | Freq: Four times a day (QID) | ORAL | Status: DC | PRN

## 2015-09-02 MED ORDER — INSULIN ASPART 100 UNIT/ML ~~LOC~~ SOLN
0.0000 [IU] | Freq: Three times a day (TID) | SUBCUTANEOUS | Status: DC
  Administered 2015-09-03 – 2015-09-04 (×3): 1 [IU] via SUBCUTANEOUS
  Filled 2015-09-02 (×3): qty 1

## 2015-09-02 MED ORDER — ASPIRIN EC 81 MG PO TBEC
81.0000 mg | DELAYED_RELEASE_TABLET | Freq: Every day | ORAL | Status: DC
  Administered 2015-09-03 – 2015-09-05 (×3): 81 mg via ORAL
  Filled 2015-09-02 (×3): qty 1

## 2015-09-02 MED ORDER — COLESEVELAM HCL 625 MG PO TABS
625.0000 mg | ORAL_TABLET | Freq: Two times a day (BID) | ORAL | Status: DC
  Administered 2015-09-02 – 2015-09-05 (×5): 625 mg via ORAL
  Filled 2015-09-02 (×7): qty 1

## 2015-09-02 MED ORDER — TIMOLOL MALEATE 0.5 % OP SOLN
1.0000 [drp] | Freq: Two times a day (BID) | OPHTHALMIC | Status: DC
  Administered 2015-09-02 – 2015-09-05 (×6): 1 [drp] via OPHTHALMIC
  Filled 2015-09-02: qty 5

## 2015-09-02 MED ORDER — DEXTROSE 5 % IV SOLN
1.0000 g | INTRAVENOUS | Status: DC
  Administered 2015-09-03: 1 g via INTRAVENOUS
  Filled 2015-09-02: qty 10

## 2015-09-02 MED ORDER — BRIMONIDINE TARTRATE-TIMOLOL 0.2-0.5 % OP SOLN: 1.0000 [drp] | Freq: Two times a day (BID) | OPHTHALMIC | Status: DC

## 2015-09-02 MED ORDER — ENOXAPARIN SODIUM 40 MG/0.4ML ~~LOC~~ SOLN
40.0000 mg | SUBCUTANEOUS | Status: DC
  Administered 2015-09-02 – 2015-09-05 (×4): 40 mg via SUBCUTANEOUS
  Filled 2015-09-02 (×4): qty 0.4

## 2015-09-02 MED ORDER — LATANOPROST 0.005 % OP SOLN
1.0000 [drp] | Freq: Every day | OPHTHALMIC | Status: DC
  Administered 2015-09-02 – 2015-09-04 (×3): 1 [drp] via OPHTHALMIC
  Filled 2015-09-02: qty 2.5

## 2015-09-02 MED ORDER — ALBUTEROL SULFATE (2.5 MG/3ML) 0.083% IN NEBU
2.5000 mg | INHALATION_SOLUTION | Freq: Three times a day (TID) | RESPIRATORY_TRACT | Status: DC
  Administered 2015-09-02 – 2015-09-03 (×2): 2.5 mg via RESPIRATORY_TRACT
  Filled 2015-09-02 (×2): qty 3

## 2015-09-02 MED ORDER — MEGESTROL ACETATE 400 MG/10ML PO SUSP
400.0000 mg | Freq: Two times a day (BID) | ORAL | Status: DC
  Administered 2015-09-02 – 2015-09-05 (×5): 400 mg via ORAL
  Filled 2015-09-02 (×8): qty 10

## 2015-09-02 MED ORDER — EZETIMIBE 10 MG PO TABS
10.0000 mg | ORAL_TABLET | Freq: Every day | ORAL | Status: DC
  Administered 2015-09-03 – 2015-09-05 (×3): 10 mg via ORAL
  Filled 2015-09-02 (×3): qty 1

## 2015-09-02 MED ORDER — DEXTROSE 5 % IV SOLN
1.0000 g | Freq: Once | INTRAVENOUS | Status: AC
  Administered 2015-09-02: 1 g via INTRAVENOUS
  Filled 2015-09-02: qty 10

## 2015-09-02 MED ORDER — SODIUM CHLORIDE 0.9 % IV BOLUS (SEPSIS)
500.0000 mL | Freq: Once | INTRAVENOUS | Status: AC
  Administered 2015-09-02: 500 mL via INTRAVENOUS

## 2015-09-02 MED ORDER — ONDANSETRON HCL 4 MG PO TABS: 4.0000 mg | ORAL_TABLET | Freq: Four times a day (QID) | ORAL | Status: DC | PRN

## 2015-09-02 MED ORDER — ONDANSETRON HCL 4 MG/2ML IJ SOLN: 4.0000 mg | Freq: Four times a day (QID) | INTRAMUSCULAR | Status: DC | PRN

## 2015-09-02 MED ORDER — RISAQUAD PO CAPS
2.0000 | ORAL_CAPSULE | Freq: Every day | ORAL | Status: DC
  Administered 2015-09-03 – 2015-09-05 (×3): 2 via ORAL
  Filled 2015-09-02 (×3): qty 2

## 2015-09-02 NOTE — ED Triage Notes (Signed)
Patient's daughter arrived and states the patient was in a chair when she went unresponsive, the patiebnt did not fall, but states she fell yesterday morning.  Pt is also being treated for UTI.  Last chemo was approx 2 weeks ago.

## 2015-09-02 NOTE — ED Notes (Signed)
Report to Deidra, RN

## 2015-09-02 NOTE — ED Triage Notes (Signed)
Pt to ED from [facility] via EMS with near syncope/syncope episode this morning while laying in bed, per EMS report.  Staff at facility report sternal rub was used to arouse patient.  On arrival to ED, pt alert and oriented x3, disoriented to situation.  Pt reporting left hip pain, but no pain with rotation.  No report of patient falling.

## 2015-09-02 NOTE — ED Provider Notes (Signed)
[facility] Emergency Department Provider Note  ____________________________________________   I have reviewed the triage vital signs and the nursing notes.   HISTORY  Chief Complaint Loss of Consciousness    HPI Madison Fry is a 82 y.o. female history of lung cancer recently discharged from the hospital with urinary tract infection. Today, she was in her normal state of health when she seemed to slump over in a chair was unresponsive for a brief period of time. She did not have any seizure activity there was no postictal period at this time, she seems nearly back to her baseline. She did not fall. She is brought in for this reason.      Past Medical History:  Diagnosis Date  . Anemia   . Breast cancer ([place])   . Cancer [facility])    breast cancer, Right side lymph nodes removed  . Diabetes mellitus without complication ([place])   . Glaucoma   . Hyperlipidemia   . Hyperlipidemia   . Hypertension   . Lung cancer ([place])   . Thyroid disease     Patient Active Problem List   Diagnosis Date Noted  . Malnutrition of moderate degree 08/25/2015  . Urinary tract infection 08/23/2015  . Metastatic small cell carcinoma to bone ([place]) 04/29/2015  . Dyspnea 04/10/2015  . Acute anxiety 04/10/2015  . Leukocytosis 04/10/2015  . Hyponatremia 04/10/2015  . Type 2 diabetes mellitus ([place]) 04/10/2015  . UTI (lower urinary tract infection) 08/19/2014  . Hip pain, acute 08/17/2014  . Elevated troponin 08/17/2014    Past Surgical History:  Procedure Laterality Date  . VIDEO BRONCHOSCOPY Bilateral 04/12/2015   Procedure: VIDEO BRONCHOSCOPY WITH FLUORO;  Surgeon: Juanito Doom, MD;  Location: [facility] ENDOSCOPY;  Service: Cardiopulmonary;  Laterality: Bilateral;    Current Outpatient Rx  . Order #: [id number] Class: Normal  . Order #: [id number] Class: Historical Med  . Order #: [id number] Class: Print  . Order #: [id number] Class: Historical Med  . Order #: [id number] Class:  Historical Med  . Order #: [id number] Class: Historical Med  . Order #: [id number] Class: No Print  . Order #: [id number] Class: Print  . Order #: [id number] Class: Historical Med  . Order #: [id number] Class: No Print  . Order #: [id number] Class: No Print  . Order #: [id number] Class: Historical Med  . Order #: [id number] Class: Historical Med  . Order #: [id number] Class: Print    Allergies Shellfish allergy  Family History  Problem Relation Age of Onset  . Bone cancer Father   . Stroke Brother     Social History Social History  Substance Use Topics  . Smoking status: Former Smoker    Packs/day: 1.50    Years: 40.00    Types: Cigarettes    Quit date: 08/27/1990  . Smokeless tobacco: Never Used  . Alcohol use No    Review of Systems Constitutional: No fever/chills Eyes: No visual changes. ENT: No sore throat. No stiff neck no neck pain Cardiovascular: Denies chest pain. Respiratory: Denies shortness of breath. Gastrointestinal:   no vomiting.  No diarrhea.  No constipation. Genitourinary: Negative for dysuria. Musculoskeletal: Negative lower extremity swelling Skin: Negative for rash. Neurological: Negative for severe headaches, focal weakness or numbness. 10-point ROS otherwise negative.  ____________________________________________   PHYSICAL EXAM:  VITAL SIGNS: ED Triage Vitals  Enc Vitals Group     BP 09/02/15 1201 112/60     Pulse Rate 09/02/15 1201 89     Resp 09/02/15 1201 18     Temp 09/02/15 1151 97.7 F (36.5  C)     Temp Source 09/02/15 1151 Oral     SpO2 09/02/15 1145 98 %     Weight 09/02/15 1151 154 lb 8 oz (70.1 kg)     Height 09/02/15 1151 '5\' 4"'$  (1.626 m)     Head Circumference --      Peak Flow --      Pain Score 09/02/15 1152 8     Pain Loc --      Pain Edu? --      Excl. in [place]? --     Constitutional: Alert and orientedTo name and place and year unsure of date. Well appearing and in no acute distress. Eyes: Conjunctivae are normal. PERRL.  EOMI. Head: Atraumatic. Nose: No congestion/rhinnorhea. Mouth/Throat: Mucous membranes are moist.  Oropharynx non-erythematous. Neck: No stridor.   Nontender with no meningismus Cardiovascular: Normal rate, regular rhythm. Grossly normal heart sounds.  Good peripheral circulation. Respiratory: Normal respiratory effort.  No retractions. Lungs CTAB. Abdominal: Soft and nontender. No distention. No guarding no rebound Back:  There is no focal tenderness or step off.  there is no midline tenderness there are no lesions noted. there is no CVA tenderness Musculoskeletal: No lower extremity tenderness, no upper extremity tenderness. No joint effusions, no DVT signs strong distal pulses no edema Neurologic:  Normal speech and language. No gross focal neurologic deficits are appreciated.  Skin:  Skin is warm, dry and intact. No rash noted. Psychiatric: Mood and affect are normal. Speech and behavior are normal.  ____________________________________________   LABS (all labs ordered are listed, but only abnormal results are displayed)  Labs Reviewed  BASIC METABOLIC PANEL - Abnormal; Notable for the following:       Result Value   Sodium 111 (*)    Chloride 80 (*)    Glucose, Bld 117 (*)    Calcium 7.9 (*)    All other components within normal limits  CBC - Abnormal; Notable for the following:    RBC 3.14 (*)    Hemoglobin 9.8 (*)    HCT 27.5 (*)    RDW 17.0 (*)    All other components within normal limits  URINALYSIS COMPLETEWITH MICROSCOPIC ([facility] ONLY) - Abnormal; Notable for the following:    Color, Urine YELLOW (*)    APPearance CLOUDY (*)    Hgb urine dipstick 1+ (*)    Leukocytes, UA 3+ (*)    Squamous Epithelial / LPF 0-5 (*)    All other components within normal limits  GLUCOSE, CAPILLARY - Abnormal; Notable for the following:    Glucose-Capillary 138 (*)    All other components within normal limits  URINE CULTURE  CBG MONITORING, ED    ____________________________________________  EKG  I personally interpreted any EKGs ordered by me or triage Normal sinus rhythm rate 89 bpm no acute ST elevation or acute ST depression normal axis ____________________________________________  RADIOLOGY  I reviewed any imaging ordered by me or triage that were performed during my shift and, if possible, patient and/or family made aware of any abnormal findings. ____________________________________________   PROCEDURES  Procedure(s) performed: None  Procedures  Critical Care performed: None  ____________________________________________   INITIAL IMPRESSION / ASSESSMENT AND PLAN / ED COURSE  Pertinent labs & imaging results that were available during my care of the patient were reviewed by me and considered in my medical decision making (see chart for details).  Patient with hyponatremia possibly SIADH, sodium is 111. Also appears to have a continuing urinary tract infection. Recent cultures  do show sensitivity to [place]. We'll give her a bolus of IV fluid to start her sodium up, that we will start her on a drip, we will also give her IV Rocephin for her UTI  Clinical Course   ____________________________________________   FINAL CLINICAL IMPRESSION(S) / ED DIAGNOSES  Final diagnoses:  None      This chart was dictated using voice recognition software.  Despite best efforts to proofread,  errors can occur which can change meaning.      Schuyler Amor, MD 09/02/15 1249

## 2015-09-02 NOTE — ED Notes (Signed)
Critical sodium verbally reported to MD, orders received. MD at bedside now.

## 2015-09-02 NOTE — H&P (Signed)
[name] [place] NAME: Madison Fry    MR#:  [id number]  DATE OF BIRTH:  1928-04-07  DATE OF ADMISSION:  09/02/2015  PRIMARY CARE PHYSICIAN: Mayra Neer, MD   REQUESTING/REFERRING PHYSICIAN: Dr. Schuyler Amor, M.D.  CHIEF COMPLAINT:   Chief Complaint  Patient presents with  . Loss of Consciousness    HISTORY OF PRESENT ILLNESS: Madison Fry  is a 82 y.o. female with a known history of Anemia, lung cancer with metastases to the bone, diabetes type 2, hyperlipidemia, hypertension, poor attention, hypothyroidism who was recently hospitalized with a urinary tract infection and was discharged to a rehabilitation facility. Borderline with generalized weakness and having episode of syncope yesterday. According to her daughter at the bedside. She has been very weak and not eating or drinking much. She also has had diarrhea. She is also drowsy a lot of the time.  PAST MEDICAL HISTORY:   Past Medical History:  Diagnosis Date  . Anemia   . Breast cancer ([place])   . Cancer [facility])    breast cancer, Right side lymph nodes removed  . Diabetes mellitus without complication ([place])   . Glaucoma   . Hyperlipidemia   . Hyperlipidemia   . Hypertension   . Lung cancer ([place])   . Thyroid disease     PAST SURGICAL HISTORY: Past Surgical History:  Procedure Laterality Date  . VIDEO BRONCHOSCOPY Bilateral 04/12/2015   Procedure: VIDEO BRONCHOSCOPY WITH FLUORO;  Surgeon: Juanito Doom, MD;  Location: [facility];  Service: Cardiopulmonary;  Laterality: Bilateral;    SOCIAL HISTORY:  Social History  Substance Use Topics  . Smoking status: Former Smoker    Packs/day: 1.50    Years: 40.00    Types: Cigarettes    Quit date: 08/27/1990  . Smokeless tobacco: Never Used  . Alcohol use No    FAMILY HISTORY:  Family History  Problem Relation Age of Onset  . Bone cancer Father   . Stroke Brother     DRUG ALLERGIES:  Allergies   Allergen Reactions  . Shellfish Allergy Swelling    REVIEW OF SYSTEMS:   CONSTITUTIONAL: No fever, Positive fatigue and weakness.  EYES: No blurred or double vision.  EARS, NOSE, AND THROAT: No tinnitus or ear pain.  RESPIRATORY: No cough, shortness of breath, wheezing or hemoptysis.  CARDIOVASCULAR: No chest pain, orthopnea, edema.  GASTROINTESTINAL: No nausea, vomiting, positive diarrhea , no abdominal pain.  GENITOURINARY: No dysuria, hematuria.  ENDOCRINE: No polyuria, nocturia,  HEMATOLOGY: No anemia, easy bruising or bleeding SKIN: No rash or lesion. MUSCULOSKELETAL: No joint pain or arthritis.   NEUROLOGIC: No tingling, numbness, weakness.  PSYCHIATRY: No anxiety or depression.   MEDICATIONS AT HOME:  Prior to Admission medications   Medication Sig Start Date End Date Taking? Authorizing Provider  albuterol (PROVENTIL) (2.5 MG/3ML) 0.083% nebulizer solution Take 3 mLs (2.5 mg total) by nebulization 3 (three) times daily. 04/29/15  Yes Laverle Hobby, MD  aspirin EC 81 MG tablet Take 81 mg by mouth daily.   Yes Historical Provider, MD  cephALEXin (KEFLEX) 250 MG capsule Take 1 capsule (250 mg total) by mouth 3 (three) times daily. 08/26/15  Yes Srikar Sudini, MD  colesevelam (WELCHOL) 625 MG tablet Take by mouth 2 (two) times daily with a meal.   Yes Historical Provider, MD  COMBIGAN 0.2-0.5 % ophthalmic solution PUT 1 DROP IN AFFECTED EYE EVERY 12 HOURS**NEED OFFICE VISIT** 03/08/15  Yes Historical Provider, MD  ezetimibe (  ZETIA) 10 MG tablet Take 10 mg by mouth daily.   Yes Historical Provider, MD  glimepiride (AMARYL) 2 MG tablet Take 2 mg by mouth daily with breakfast.   Yes Historical Provider, MD  HYDROcodone-acetaminophen (NORCO/VICODIN) 5-325 MG tablet Take 1 tablet by mouth every 6 (six) hours as needed for severe pain. 08/26/15  Yes Srikar Sudini, MD  levothyroxine (SYNTHROID, LEVOTHROID) 50 MCG tablet Take 50 mcg by mouth daily.   Yes Historical Provider, MD   LORazepam (ATIVAN) 0.5 MG tablet Take 0.5 mg by mouth every 6 (six) hours as needed for anxiety.   Yes Historical Provider, MD  losartan (COZAAR) 100 MG tablet Take 100 mg by mouth daily.   Yes Historical Provider, MD  metFORMIN (GLUCOPHAGE-XR) 500 MG 24 hr tablet Take 1 tablet (500 mg total) by mouth 2 (two) times daily. 08/26/15  Yes Srikar Sudini, MD  TRAVATAN Z 0.004 % SOLN ophthalmic solution PUT 1 DROP INTO BOTH EYES AT BEDTIME 03/16/15  Yes Historical Provider, MD  verapamil (CALAN-SR) 120 MG CR tablet Take 120 mg by mouth daily.   Yes Historical Provider, MD      PHYSICAL EXAMINATION:   VITAL SIGNS: Blood pressure (!) 141/75, pulse 97, temperature 97.7 F (36.5 C), temperature source Oral, resp. rate 18, height '5\' 4"'$  (1.626 m), weight 70.1 kg (154 lb 8 oz), SpO2 96 %.  GENERAL:  82 y.o.-year-old patient lying in the bed with no acute distress.  EYES: Pupils equal, round, reactive to light and accommodation. No scleral icterus. Extraocular muscles intact.  HEENT: Head atraumatic, normocephalic. Oropharynx and nasopharynx clear.  NECK:  Supple, no jugular venous distention. No thyroid enlargement, no tenderness.  LUNGS: Normal breath sounds bilaterally, no wheezing, rales,rhonchi or crepitation. No use of accessory muscles of respiration.  CARDIOVASCULAR: S1, S2 normal. No murmurs, rubs, or gallops.  ABDOMEN: Soft, nontender, nondistended. Bowel sounds present. No organomegaly or mass.  EXTREMITIES: No pedal edema, cyanosis, or clubbing.  NEUROLOGIC: Cranial nerves II through XII are intact. Muscle strength 5/5 in all extremities. Sensation intact. Gait not checked.  PSYCHIATRIC: The patient is alert and oriented x 3.  SKIN: No obvious rash, lesion, or ulcer.   LABORATORY PANEL:   CBC  Recent Labs Lab 09/02/15 1205  WBC 4.6  HGB 9.8*  HCT 27.5*  PLT 269  MCV 87.5  MCH 31.1  MCHC 35.6  RDW 17.0*    ------------------------------------------------------------------------------------------------------------------  Chemistries   Recent Labs Lab 09/02/15 1205  NA 111*  K 4.0  CL 80*  CO2 23  GLUCOSE 117*  BUN 8  CREATININE 0.45  CALCIUM 7.9*   ------------------------------------------------------------------------------------------------------------------ estimated creatinine clearance is 50.3 mL/min (by C-G formula based on SCr of 0.8 mg/dL). ------------------------------------------------------------------------------------------------------------------ No results for input(s): TSH, T4TOTAL, T3FREE, THYROIDAB in the last 72 hours.  Invalid input(s): FREET3   Coagulation profile No results for input(s): INR, PROTIME in the last 168 hours. ------------------------------------------------------------------------------------------------------------------- No results for input(s): DDIMER in the last 72 hours. -------------------------------------------------------------------------------------------------------------------  Cardiac Enzymes No results for input(s): CKMB, TROPONINI, MYOGLOBIN in the last 168 hours.  Invalid input(s): CK ------------------------------------------------------------------------------------------------------------------ Invalid input(s): POCBNP  ---------------------------------------------------------------------------------------------------------------  Urinalysis    Component Value Date/Time   COLORURINE YELLOW (A) 09/02/2015 1217   APPEARANCEUR CLOUDY (A) 09/02/2015 1217   LABSPEC 1.011 09/02/2015 1217   PHURINE 7.0 09/02/2015 1217   GLUCOSEU NEGATIVE 09/02/2015 1217   HGBUR 1+ (A) 09/02/2015 1217   BILIRUBINUR NEGATIVE 09/02/2015 1217   KETONESUR NEGATIVE 09/02/2015 1217   PROTEINUR NEGATIVE 09/02/2015 1217  UROBILINOGEN 1.0 09/08/2009 1302   NITRITE NEGATIVE 09/02/2015 1217   LEUKOCYTESUR 3+ (A) 09/02/2015 1217      RADIOLOGY: No results found.  EKG: Orders placed or performed during the hospital encounter of 09/02/15  . ED EKG  . ED EKG    IMPRESSION AND PLAN: Patient is a 82 year old female presenting to the hospital again with generalized weakness  1. Generalized weakness Due to severe hyponatremia as well as UTI  2. Severe hyponatremia, suspect due to poor by mouth intake and dehydration and diarrhea We'll give her IV fluids, follow her sodium level  3. Urinary tract infection Patient had Escherichia coli sensitive to ceftriaxone, which I will use for now  4. Diarrhea:  Check stool for C. Difficile  5. Diabetes type 2 We'll place patient on sliding scale insulin Continue therapy with glimepiride. Monitor blood sugar closely. If she has hypoglycemia, will need to stop this  6. Anemia Chronic we'll continue to monitor  7. Lung cancer with metastases If patient does not show improvement, consider palliative care input. She is a DO NOT RESUSCITATE    All the records are reviewed and case discussed with ED provider. Management plans discussed with the patient, family and they are in agreement.  CODE STATUS:    Code Status Orders        Start     Ordered   09/02/15 1336  Do not attempt resuscitation (DNR)  Continuous    Question Answer Comment  In the event of cardiac or respiratory ARREST Do not call a "code blue"   In the event of cardiac or respiratory ARREST Do not perform Intubation, CPR, defibrillation or ACLS   In the event of cardiac or respiratory ARREST Use medication by any route, position, wound care, and other measures to relive pain and suffering. May use oxygen, suction and manual treatment of airway obstruction as needed for comfort.      09/02/15 1335    Code Status History    Date Active Date Inactive Code Status Order ID Comments User Context   09/02/2015  1:35 PM 09/02/2015  1:35 PM Full Code [id number]  Dustin Flock, MD ED   08/23/2015  8:22 PM  08/26/2015  9:25 PM DNR [id number]  Henreitta Leber, MD Inpatient   04/10/2015  9:47 PM 04/12/2015  4:42 PM Full Code [id number]  Vianne Bulls, MD ED   08/17/2014 12:22 PM 08/20/2014  3:56 PM Full Code [id number]  Nicholes Mango, MD Inpatient    Advance Directive Documentation   Flowsheet Row Most Recent Value  Type of Advance Directive  Healthcare Power of Attorney  Pre-existing out of facility DNR order (yellow form or pink MOST form)  No data  "MOST" Form in Place?  No data       TOTAL TIME TAKING CARE OF THIS PATIENT23mnutes.    PDustin FlockM.D on 09/02/2015 at 4:02 PM  Between 7am to 6pm - Pager - [phone]  After 6pm go to [email or web address] - password [email or web address]  Office  [phone] CC: Primary care physician; SMayra Neer MD

## 2015-09-02 NOTE — ED Notes (Signed)
Pt recently discharged from hospital after admission for UTI and recent pelvic fracture after fall. Pt incontinent of urine. Pt alert and oriented X 4 at this time. Complains of pain when asked to right hip. Pt did not fall today because she was in chair in time of stated LOC. Family at bedside.

## 2015-09-03 ENCOUNTER — Inpatient Hospital Stay: Payer: Commercial Managed Care - HMO

## 2015-09-03 DIAGNOSIS — L8990 Pressure ulcer of unspecified site, unspecified stage: Secondary | ICD-10-CM | POA: Insufficient documentation

## 2015-09-03 LAB — GLUCOSE, CAPILLARY
GLUCOSE-CAPILLARY: 126 mg/dL — AB (ref 65–99)
GLUCOSE-CAPILLARY: 127 mg/dL — AB (ref 65–99)
GLUCOSE-CAPILLARY: 89 mg/dL (ref 65–99)
Glucose-Capillary: 99 mg/dL (ref 65–99)

## 2015-09-03 LAB — BASIC METABOLIC PANEL
ANION GAP: 9 (ref 5–15)
Anion gap: 9 (ref 5–15)
BUN: <5 mg/dL — ABNORMAL LOW (ref 6–20)
BUN: 6 mg/dL (ref 6–20)
CALCIUM: 7.6 mg/dL — AB (ref 8.9–10.3)
CO2: 22 mmol/L (ref 22–32)
CO2: 20 mmol/L — ABNORMAL LOW (ref 22–32)
CREATININE: 0.31 mg/dL — AB (ref 0.44–1.00)
Calcium: 7.3 mg/dL — ABNORMAL LOW (ref 8.9–10.3)
Chloride: 81 mmol/L — ABNORMAL LOW (ref 101–111)
Chloride: 81 mmol/L — ABNORMAL LOW (ref 101–111)
Creatinine, Ser: <0.3 mg/dL — ABNORMAL LOW (ref 0.44–1.00)
GFR calc non Af Amer: >60 mL/min (ref >60)
Glucose, Bld: 110 mg/dL — ABNORMAL HIGH (ref 65–99)
Glucose, Bld: 108 mg/dL — ABNORMAL HIGH (ref 65–99)
POTASSIUM: 3 mmol/L — AB (ref 3.5–5.1)
Potassium: 3.2 mmol/L — ABNORMAL LOW (ref 3.5–5.1)
SODIUM: 112 mmol/L — AB (ref 135–145)
SODIUM: 110 mmol/L — AB (ref 135–145)

## 2015-09-03 LAB — CBC
HCT: 29.6 % — ABNORMAL LOW (ref 35.0–47.0)
Hemoglobin: 10.5 g/dL — ABNORMAL LOW (ref 12.0–16.0)
MCH: 31.2 pg (ref 26.0–34.0)
MCHC: 35.3 g/dL (ref 32.0–36.0)
MCV: 88.4 fL (ref 80.0–100.0)
Platelets: 264 10*3/uL (ref 150–440)
RBC: 3.36 MIL/uL — ABNORMAL LOW (ref 3.80–5.20)
RDW: 17.3 % — AB (ref 11.5–14.5)
WBC: 5.1 10*3/uL (ref 3.6–11.0)

## 2015-09-03 LAB — SODIUM: SODIUM: 112 mmol/L — AB (ref 135–145)

## 2015-09-03 LAB — OSMOLALITY, URINE: OSMOLALITY UR: 399 mosm/kg (ref 300–900)

## 2015-09-03 LAB — MAGNESIUM: MAGNESIUM: 1.4 mg/dL — AB (ref 1.7–2.4)

## 2015-09-03 MED ORDER — HYDROCODONE-ACETAMINOPHEN 5-325 MG PO TABS
1.0000 | ORAL_TABLET | Freq: Four times a day (QID) | ORAL | Status: DC | PRN
  Administered 2015-09-04 – 2015-09-05 (×3): 1 via ORAL
  Filled 2015-09-03 (×4): qty 1

## 2015-09-03 MED ORDER — MAGNESIUM SULFATE 4 GM/100ML IV SOLN
4.0000 g | Freq: Once | INTRAVENOUS | Status: AC
  Administered 2015-09-03: 20:00:00 4 g via INTRAVENOUS
  Filled 2015-09-03: qty 100

## 2015-09-03 MED ORDER — POTASSIUM CHLORIDE IN NACL 20-0.9 MEQ/L-% IV SOLN
INTRAVENOUS | Status: DC
  Administered 2015-09-03: 15:00:00 via INTRAVENOUS
  Filled 2015-09-03 (×2): qty 1000

## 2015-09-03 MED ORDER — DEXTROSE 5 % IV SOLN
1.0000 g | Freq: Three times a day (TID) | INTRAVENOUS | Status: DC
  Administered 2015-09-03 – 2015-09-04 (×3): 1 g via INTRAVENOUS
  Filled 2015-09-03 (×5): qty 1

## 2015-09-03 MED ORDER — SODIUM CHLORIDE 3 % IV SOLN
INTRAVENOUS | Status: DC
  Administered 2015-09-03 – 2015-09-04 (×2): 30 mL/h via INTRAVENOUS
  Administered 2015-09-05: 25 mL/h via INTRAVENOUS
  Filled 2015-09-03 (×6): qty 500

## 2015-09-03 MED ORDER — POTASSIUM CHLORIDE CRYS ER 20 MEQ PO TBCR
40.0000 meq | EXTENDED_RELEASE_TABLET | Freq: Two times a day (BID) | ORAL | Status: DC
  Administered 2015-09-03: 40 meq via ORAL
  Filled 2015-09-03 (×2): qty 2

## 2015-09-03 MED ORDER — ENSURE ENLIVE PO LIQD
237.0000 mL | Freq: Two times a day (BID) | ORAL | Status: DC
  Administered 2015-09-03 – 2015-09-05 (×5): 237 mL via ORAL

## 2015-09-03 MED ORDER — ALBUTEROL SULFATE (2.5 MG/3ML) 0.083% IN NEBU
2.5000 mg | INHALATION_SOLUTION | Freq: Three times a day (TID) | RESPIRATORY_TRACT | Status: DC
  Administered 2015-09-03 – 2015-09-05 (×7): 2.5 mg via RESPIRATORY_TRACT
  Filled 2015-09-03 (×7): qty 3

## 2015-09-03 NOTE — Progress Notes (Signed)
Initial Nutrition Assessment  DOCUMENTATION CODES:   Non-severe (moderate) malnutrition in context of acute illness/injury  INTERVENTION:  -Monitor intake and cater to pt preferences. -Recommend Ensure Enlive po BID, each supplement provides 350 kcal and 20 grams of protein -Discussed high protein, high calorie foods to improve nutrition.  Pt verbalized understanding   NUTRITION DIAGNOSIS:   Malnutrition related to poor appetite as evidenced by per patient/family report, mild depletion of body fat, moderate depletion of body fat, mild depletion of muscle mass, moderate depletions of muscle mass.    GOAL:   Patient will meet greater than or equal to 90% of their needs    MONITOR:   PO intake, Supplement acceptance  REASON FOR ASSESSMENT:   Malnutrition Screening Tool    ASSESSMENT:      Pt admitted with syncope, weakness, UTI, diarrhea. Recent admission for UTI  Past Medical History:  Diagnosis Date  . Anemia   . Breast cancer ([place])   . Cancer [facility])    breast cancer, Right side lymph nodes removed  . Diabetes mellitus without complication ([place])   . Glaucoma   . Hyperlipidemia   . Hyperlipidemia   . Hypertension   . Lung cancer ([place])   . Thyroid disease    Pt reports poor po intake for the last week, eating bites.  Reports that she has been drinking some oral nutrition supplements.  "I like them."  Medications reviewed: amaryl, aspart, megace, metformin, NS at 56m/hr Labs reviewed: Na 112, K 3.2, glucose 110 cdiff negative  Nutrition-Focused physical exam completed. Findings are mild/moderate fat depletion, mild/moderate  muscle depletion, and no edema.    Diet Order:  DIET SOFT Room service appropriate? Yes; Fluid consistency: Thin  Skin:   (stage I on coccyx)  Last BM:  8/7  Height:   Ht Readings from Last 1 Encounters:  09/02/15 '5\' 4"'$  (1.626 m)    Weight: 4% wt loss in the last month  Wt Readings from Last 1 Encounters:  09/02/15 151 lb  11.2 oz (68.8 kg)    Ideal Body Weight:     BMI:  Body mass index is 26.04 kg/m.  Estimated Nutritional Needs:   Kcal:  11505-6979kcals/d  Protein:  82-102 g/d  Fluid:  2 L/d  EDUCATION NEEDS:   Education needs addressed  Jerre Vandrunen B. AZenia Resides [place](pager) Weekend/On-Call pager [phone]

## 2015-09-03 NOTE — Progress Notes (Addendum)
[facility] NAME: Madison Fry    MR#:  [id number]  DATE OF BIRTH:  08/15/1928  SUBJECTIVE:  CHIEF COMPLAINT:   Chief Complaint  Patient presents with  . Loss of Consciousness  The patient is a 82 year old Caucasian female with past history significant for history of anemia, lung cancer with metastasis to bone, diabetes mellitus, hyperlipidemia, hypertension, hypothyroidism, who was sent to the hospitalist for urinary tract infection and discharged to rehabilitation facility comes back with weakness and episode of syncope, inability to eat or drink, diarrhea, altered mental status/drowsiness. On arrival to the hospital, she was noted to have sodium level of 111 and was admitted for IV fluid administration, she remained weak, hypotensive, unable to review systems  Review of Systems  Unable to perform ROS: Mental acuity  Neurological: Positive for weakness.    VITAL SIGNS: Blood pressure (!) 97/51, pulse 78, temperature 97.9 F (36.6 C), resp. rate 20, height '5\' 4"'$  (1.626 m), weight 68.8 kg (151 lb 11.2 oz), SpO2 100 %.  PHYSICAL EXAMINATION:   GENERAL:  82 y.o.-year-old patient lying in the bed with no acute distress. Some somnolent.  EYES: Pupils equal, round, reactive to light and accommodation. No scleral icterus. Extraocular muscles intact.  HEENT: Head atraumatic, normocephalic. Oropharynx and nasopharynx clear.  NECK:  Supple, no jugular venous distention. No thyroid enlargement, no tenderness.  LUNGS: Normal breath sounds bilaterally, no wheezing, rales,rhonchi or crepitation, poor effort. No use of accessory muscles of respiration.  CARDIOVASCULAR: S1, S2 normal. No murmurs, rubs, or gallops.  ABDOMEN: Soft, nontender, nondistended. Bowel sounds present. No organomegaly or mass.  EXTREMITIES: No pedal edema, cyanosis, or clubbing.  NEUROLOGIC: Cranial nerves II through XII are grossly intact. Muscle strength not able  to evaluate due to generalized weakness. Sensation grossly intact. Gait not checked.  PSYCHIATRIC: The patient is somnolent .  SKIN: No obvious rash, lesion, or ulcer.   ORDERS/RESULTS REVIEWED:   CBC  Recent Labs Lab 09/02/15 1205 09/03/15 0520  WBC 4.6 5.1  HGB 9.8* 10.5*  HCT 27.5* 29.6*  PLT 269 264  MCV 87.5 88.4  MCH 31.1 31.2  MCHC 35.6 35.3  RDW 17.0* 17.3*   ------------------------------------------------------------------------------------------------------------------  Chemistries   Recent Labs Lab 09/02/15 1205 09/03/15 0520 09/03/15 1244  NA 111* 112* 110*  K 4.0 3.2* 3.0*  CL 80* 81* 81*  CO2 23 22 20*  GLUCOSE 117* 110* 108*  BUN 8 <5* 6  CREATININE 0.45 0.31* <0.30*  CALCIUM 7.9* 7.6* 7.3*   ------------------------------------------------------------------------------------------------------------------ CrCl cannot be calculated (This lab value cannot be used to calculate CrCl because it is not a number: <0.30). ------------------------------------------------------------------------------------------------------------------ No results for input(s): TSH, T4TOTAL, T3FREE, THYROIDAB in the last 72 hours.  Invalid input(s): FREET3  Cardiac Enzymes No results for input(s): CKMB, TROPONINI, MYOGLOBIN in the last 168 hours.  Invalid input(s): CK ------------------------------------------------------------------------------------------------------------------ Invalid input(s): POCBNP ---------------------------------------------------------------------------------------------------------------  RADIOLOGY: No results found.  EKG:  Orders placed or performed during the hospital encounter of 09/02/15  . ED EKG  . ED EKG    ASSESSMENT AND PLAN:  Active Problems:   Hyponatremia   Pressure ulcer #1. Severe hyponatremia, likely SIADH, urine osmolarity is high, patient's sodium has worsened with IV fluid administration, stop IV fluids, initiate  patient on 3% sodium chloride solution, following sodium levels every 4 hours, getting nephrologist involved for recommendations #2 metabolic encephalopathy due to severe hyponatremia, supportive therapy, #3 hypotension, patient received IV fluids with normal saline  with no improvement of her blood pressure readings, likely due to malnutrition, sepsis, follow closely on 3% sodium solution, boluses of IV fluids if needed, palliative care involvement #4. Hypokalemia, check magnesium level and supplement orally, recheck in the morning #5. Sepsis due to Urinary tract infection, urine cultures revealed Pseudomonas aeruginosa, change antibiotic therapy to ceftazidime, follow sensitivities and adjust antibiotics depending on the culture results  Management plans discussed with the patient, family and they are in agreement.   DRUG ALLERGIES:  Allergies  Allergen Reactions  . Shellfish Allergy Swelling    CODE STATUS:     Code Status Orders        Start     Ordered   09/02/15 1336  Do not attempt resuscitation (DNR)  Continuous    Question Answer Comment  In the event of cardiac or respiratory ARREST Do not call a "code blue"   In the event of cardiac or respiratory ARREST Do not perform Intubation, CPR, defibrillation or ACLS   In the event of cardiac or respiratory ARREST Use medication by any route, position, wound care, and other measures to relive pain and suffering. May use oxygen, suction and manual treatment of airway obstruction as needed for comfort.      09/02/15 1335    Code Status History    Date Active Date Inactive Code Status Order ID Comments User Context   09/02/2015  1:35 PM 09/02/2015  1:35 PM Full Code [id number]  Dustin Flock, MD ED   08/23/2015  8:22 PM 08/26/2015  9:25 PM DNR [id number]  Henreitta Leber, MD Inpatient   04/10/2015  9:47 PM 04/12/2015  4:42 PM Full Code [id number]  Vianne Bulls, MD ED   08/17/2014 12:22 PM 08/20/2014  3:56 PM Full Code [id number]  Nicholes Mango,  MD Inpatient    Advance Directive Documentation   Flowsheet Row Most Recent Value  Type of Advance Directive  Healthcare Power of Attorney  Pre-existing out of facility DNR order (yellow form or pink MOST form)  No data  "MOST" Form in Place?  No data      TOTAL Critical care TIME TAKING CARE OF THIS PATIENT: 40 minutes.    Theodoro Grist M.D on 09/03/2015 at 3:51 PM  Between 7am to 6pm - Pager - [phone]  After 6pm go to [email or web address] - password [email or web address] [facility]  Office  [phone]  CC: Primary care physician; Mayra Neer, MD

## 2015-09-03 NOTE — Consult Note (Signed)
   [facility] CM Inpatient Consult   09/03/2015  Madison Fry Jun 06, 1928 [id number]   Patient screened for potential [facility] services. Patient is eligible for [facility]. Spoke with inpatient case manager about patient who stated discharge plans were still being decided and it would be best to check back later in the week to see if this patient was appropriate for services. If patient's post hospital needs change please place a [facility] Care Management consult. For questions please contact:   Reet Scharrer RN, [facility] Liaison  [phone]) Business Mobile [phone]) Toll free office

## 2015-09-03 NOTE — Care Management Important Message (Signed)
Important Message  Patient Details  Name: Madison Fry MRN: [id number] Date of Birth: Apr 16, 1928   Medicare Important Message Given:  Yes    Shelbie Ammons, RN 09/03/2015, 8:06 AM

## 2015-09-03 NOTE — Plan of Care (Signed)
Problem: Pain Managment: Goal: General experience of comfort will improve Outcome: Progressing Norco times one dose. Improvement noted.  Problem: Fluid Volume: Goal: Ability to maintain a balanced intake and output will improve Outcome: Progressing Picc Line Placement ordered for hypertonic fluids.   Problem: Fluid Volume: Goal: Ability to maintain a balanced intake and output will improve Outcome: Progressing Picc line ordered for hypertonic fluids.

## 2015-09-03 NOTE — Consult Note (Signed)
Date: 09/03/2015                  Patient Name:  Madison Fry  MRN: [id number]  DOB: 13-Sep-1928  Age / Sex: 82 y.o., female         PCP: Mayra Neer, MD                 Service Requesting Consult: Internal medicine                 Reason for Consult: hyponatremia            History of Present Illness: Patient is a 82 y.o. female with medical problems of anemia, metastatic lung cancer, diabetes type 2, hyperlipidemia, hypertension, hypothyroidism, recent pelvic fracture who was admitted to [facility] on 09/02/2015 for evaluation of syncope at nursing home.  Patient is a resident of [facility], nursing home.  She was brought in via EMS for near syncope episode in the morning when laying in the bed.  Per report, staff at the facility was not able to arouse the patient with sternal rub.  On arrival to the ED, patient was alert but disoriented to the situation. For the notes indicated that patient became unresponsive in the chair but did not fall.  She had chemotherapy 2 weeks ago.  She also being treated for UTI. Her labs showed that sodium was down to 111.  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  She was treated with radiation therapy .  She has a thoracic mass.  She also has a history of breast cancer and has completed 5 years of tamoxifen. She was prescribed Bactrim twice daily for UTI  In the hospital, she is being treated with IV normal saline. It has resulted in actually worsening of sodium 110 this afternoon Nephrology consult has been requested for evaluation   Medications: Outpatient medications: Prescriptions Prior to Admission  Medication Sig Dispense Refill Last Dose  . albuterol (PROVENTIL) (2.5 MG/3ML) 0.083% nebulizer solution Take 3 mLs (2.5 mg total) by nebulization 3 (three) times daily. 252 mL 3 unknown at  unknown  . aspirin EC 81 MG tablet Take 81 mg by mouth daily.   unknown at unknown  . cephALEXin (KEFLEX) 250 MG capsule Take 1 capsule (250 mg total) by mouth 3 (three) times daily. 21 capsule 0 unknown at unknown  . colesevelam (WELCHOL) 625 MG tablet Take by mouth 2 (two) times daily with a meal.   unknown at unknown  . COMBIGAN 0.2-0.5 % ophthalmic solution PUT 1 DROP IN AFFECTED EYE EVERY 12 HOURS**NEED OFFICE VISIT**  0 unknown at unknown  . ezetimibe (ZETIA) 10 MG tablet Take 10 mg by mouth daily.    at unknown  . glimepiride (AMARYL) 2 MG tablet Take 2 mg by mouth daily with breakfast.   unknown at unknown  . HYDROcodone-acetaminophen (NORCO/VICODIN) 5-325 MG tablet Take 1 tablet by mouth every 6 (six) hours as needed for severe pain. 20 tablet 0 unknown at unknown  . levothyroxine (SYNTHROID, LEVOTHROID) 50 MCG tablet Take 50 mcg by mouth daily.   unknown at unknown  . LORazepam (ATIVAN) 0.5 MG tablet Take 0.5 mg by mouth every 6 (six) hours as needed for anxiety.   unknown at unknown  . losartan (COZAAR) 100 MG tablet Take 100 mg by mouth  daily.   unknown at unknown  . metFORMIN (GLUCOPHAGE-XR) 500 MG 24 hr tablet Take 1 tablet (500 mg total) by mouth 2 (two) times daily.    at unknown  . TRAVATAN Z 0.004 % SOLN ophthalmic solution PUT 1 DROP INTO BOTH EYES AT BEDTIME  0 unknown at unknown  . verapamil (CALAN-SR) 120 MG CR tablet Take 120 mg by mouth daily.   unknown at unknown    Current medications: Current Facility-Administered Medications  Medication Dose Route Frequency Provider Last Rate Last Dose  . 0.9 % NaCl with KCl 20 mEq/ L  infusion   Intravenous Continuous Theodoro Grist, MD      . acetaminophen (TYLENOL) tablet 650 mg  650 mg Oral Q6H PRN Dustin Flock, MD       Or  . acetaminophen (TYLENOL) suppository 650 mg  650 mg Rectal Q6H PRN Dustin Flock, MD      . acidophilus (RISAQUAD) capsule 2 capsule  2 capsule Oral Daily Dustin Flock, MD   2 capsule at 09/03/15 1007   . albuterol (PROVENTIL) (2.5 MG/3ML) 0.083% nebulizer solution 2.5 mg  2.5 mg Nebulization TID Theodoro Grist, MD   2.5 mg at 09/03/15 1308  . aspirin EC tablet 81 mg  81 mg Oral Daily Dustin Flock, MD   81 mg at 09/03/15 1007  . brimonidine (ALPHAGAN) 0.2 % ophthalmic solution 1 drop  1 drop Both Eyes Q12H Dustin Flock, MD   1 drop at 09/03/15 1012   And  . timolol (TIMOPTIC) 0.5 % ophthalmic solution 1 drop  1 drop Both Eyes Q12H Dustin Flock, MD   1 drop at 09/03/15 1010  . cefTRIAXone (ROCEPHIN) 1 g in dextrose 5 % 50 mL IVPB  1 g Intravenous Q24H Dustin Flock, MD   1 g at 09/03/15 1009  . colesevelam [facility]) tablet 625 mg  625 mg Oral BID WC Dustin Flock, MD   625 mg at 09/03/15 1610  . enoxaparin (LOVENOX) injection 40 mg  40 mg Subcutaneous Q24H Dustin Flock, MD   40 mg at 09/02/15 1630  . ezetimibe (ZETIA) tablet 10 mg  10 mg Oral Daily Dustin Flock, MD   10 mg at 09/03/15 1007  . feeding supplement (ENSURE ENLIVE) (ENSURE ENLIVE) liquid 237 mL  237 mL Oral BID BM Theodoro Grist, MD   237 mL at 09/03/15 1030  . glimepiride (AMARYL) tablet 2 mg  2 mg Oral Q breakfast Dustin Flock, MD   2 mg at 09/03/15 9604  . HYDROcodone-acetaminophen (NORCO/VICODIN) 5-325 MG per tablet 1 tablet  1 tablet Oral Q6H PRN Dustin Flock, MD   1 tablet at 09/03/15 1121  . insulin aspart (novoLOG) injection 0-9 Units  0-9 Units Subcutaneous TID WC Dustin Flock, MD   1 Units at 09/03/15 1242  . latanoprost (XALATAN) 0.005 % ophthalmic solution 1 drop  1 drop Both Eyes QHS Dustin Flock, MD   1 drop at 09/02/15 2057  . levothyroxine (SYNTHROID, LEVOTHROID) tablet 50 mcg  50 mcg Oral QAC breakfast Dustin Flock, MD   50 mcg at 09/03/15 [phone]  . LORazepam (ATIVAN) tablet 0.5 mg  0.5 mg Oral Q6H PRN Dustin Flock, MD      . megestrol (MEGACE) 400 MG/10ML suspension 400 mg  400 mg Oral BID Dustin Flock, MD   400 mg at 09/03/15 1009  . metFORMIN (GLUCOPHAGE-XR) 24 hr tablet 500 mg  500 mg Oral BID  WC Dustin Flock, MD   500 mg at 09/03/15 8119  .  ondansetron (ZOFRAN) tablet 4 mg  4 mg Oral Q6H PRN Dustin Flock, MD       Or  . ondansetron (ZOFRAN) injection 4 mg  4 mg Intravenous Q6H PRN Dustin Flock, MD      . sodium chloride flush (NS) 0.9 % injection 3 mL  3 mL Intravenous Q12H Dustin Flock, MD   3 mL at 09/03/15 1015  . verapamil (CALAN-SR) CR tablet 120 mg  120 mg Oral Daily Dustin Flock, MD   120 mg at 09/03/15 1007   Facility-Administered Medications Ordered in Other Encounters  Medication Dose Route Frequency Provider Last Rate Last Dose  . 0.9 %  sodium chloride infusion   Intravenous Continuous Lequita Asal, MD          Allergies: Allergies  Allergen Reactions  . Shellfish Allergy Swelling      Past Medical History: Past Medical History:  Diagnosis Date  . Anemia   . Breast cancer ([place])   . Cancer [facility])    breast cancer, Right side lymph nodes removed  . Diabetes mellitus without complication ([place])   . Glaucoma   . Hyperlipidemia   . Hyperlipidemia   . Hypertension   . Lung cancer ([place])   . Thyroid disease      Past Surgical History: Past Surgical History:  Procedure Laterality Date  . VIDEO BRONCHOSCOPY Bilateral 04/12/2015   Procedure: VIDEO BRONCHOSCOPY WITH FLUORO;  Surgeon: Juanito Doom, MD;  Location: [facility];  Service: Cardiopulmonary;  Laterality: Bilateral;     Family History: Family History  Problem Relation Age of Onset  . Bone cancer Father   . Stroke Brother      Social History: Social History   Social History  . Marital status: Widowed    Spouse name: N/A  . Number of children: N/A  . Years of education: N/A   Occupational History  . Not on file.   Social History Main Topics  . Smoking status: Former Smoker    Packs/day: 1.50    Years: 40.00    Types: Cigarettes    Quit date: 08/27/1990  . Smokeless tobacco: Never Used  . Alcohol use No  . Drug use: No  . Sexual activity: No   Other Topics  Concern  . Not on file   Social History Narrative  . No narrative on file     Review of Systems:unreliable as patient is lethargic Gen: denies fevers or chills, headaches HEENT: denies vision problems CV: denies chest pain or shortness of breath Resp: denies cough, sputum GI: patient reports chronic diarrhea GU : denies any problems with voiding MS: denies any complaints at present Derm:  no complaints Psych:no complaints Heme: no complaints Neuro: no evidence Endocrine  No complaints  Vital Signs: Blood pressure (!) 97/51, pulse 78, temperature 97.9 F (36.6 C), resp. rate 20, height '5\' 4"'$  (1.626 m), weight 68.8 kg (151 lb 11.2 oz), SpO2 100 %.   Intake/Output Summary (Last 24 hours) at 09/03/15 1439 Last data filed at 09/03/15 1241  Gross per 24 hour  Intake            347.5 ml  Output                0 ml  Net            347.5 ml    Weight trends: Filed Weights   09/02/15 1151 09/02/15 1700  Weight: 70.1 kg (154 lb 8 oz) 68.8 kg (151 lb 11.2 oz)  Physical Exam: General:  no acute distress, chronically ill appearing   HEENT Anicteric, somewhat dry oral mucous membranes  Neck:  supple  Lungs: Clear to auscultation bilaterally  Heart::  regular with ectopic beats, no rub  Abdomen: Soft, nontender, nondistended  Extremities:  no peripheral edema  Neurologic: Lethargic but able to answer questions  Skin: No acute rashes, normal turgor             Lab results: Basic Metabolic Panel:  Recent Labs Lab 09/02/15 1205 09/03/15 0520 09/03/15 1244  NA 111* 112* 110*  K 4.0 3.2* 3.0*  CL 80* 81* 81*  CO2 23 22 20*  GLUCOSE 117* 110* 108*  BUN 8 <5* 6  CREATININE 0.45 0.31* <0.30*  CALCIUM 7.9* 7.6* 7.3*    Liver Function Tests: No results for input(s): AST, ALT, ALKPHOS, BILITOT, PROT, ALBUMIN in the last 168 hours. No results for input(s): LIPASE, AMYLASE in the last 168 hours. No results for input(s): AMMONIA in the last 168  hours.  CBC:  Recent Labs Lab 09/02/15 1205 09/03/15 0520  WBC 4.6 5.1  HGB 9.8* 10.5*  HCT 27.5* 29.6*  MCV 87.5 88.4  PLT 269 264    Cardiac Enzymes: No results for input(s): CKTOTAL, TROPONINI in the last 168 hours.  BNP: Invalid input(s): POCBNP  CBG:  Recent Labs Lab 09/02/15 1149 09/02/15 1655 09/02/15 2052 09/03/15 0744 09/03/15 1121  GLUCAP 138* 108* 115* 126* 127*    Microbiology: Recent Results (from the past 720 hour(s))  Urine culture     Status: Abnormal   Collection Time: 08/08/15  9:30 AM  Result Value Ref Range Status   Specimen Description URINE, CLEAN CATCH  Final   Special Requests NONE  Final   Culture >=100,000 COLONIES/mL ESCHERICHIA COLI (A)  Final   Report Status 08/10/2015 FINAL  Final   Organism ID, Bacteria ESCHERICHIA COLI (A)  Final      Susceptibility   Escherichia coli - MIC*    AMPICILLIN 8 SENSITIVE Sensitive     CEFAZOLIN <=4 SENSITIVE Sensitive     CEFTRIAXONE <=1 SENSITIVE Sensitive     CIPROFLOXACIN >=4 RESISTANT Resistant     GENTAMICIN <=1 SENSITIVE Sensitive     IMIPENEM <=0.25 SENSITIVE Sensitive     NITROFURANTOIN <=16 SENSITIVE Sensitive     TRIMETH/SULFA <=20 SENSITIVE Sensitive     AMPICILLIN/SULBACTAM 4 SENSITIVE Sensitive     PIP/TAZO <=4 SENSITIVE Sensitive     Extended ESBL NEGATIVE Sensitive     * >=100,000 COLONIES/mL ESCHERICHIA COLI  Urine culture     Status: Abnormal   Collection Time: 08/19/15  1:04 PM  Result Value Ref Range Status   Specimen Description URINE, CLEAN CATCH  Final   Special Requests NONE  Final   Culture >=100,000 COLONIES/mL ESCHERICHIA COLI (A)  Final   Report Status 08/21/2015 FINAL  Final   Organism ID, Bacteria ESCHERICHIA COLI (A)  Final      Susceptibility   Escherichia coli - MIC*    AMPICILLIN 16 INTERMEDIATE Intermediate     CEFAZOLIN <=4 SENSITIVE Sensitive     CEFTRIAXONE <=1 SENSITIVE Sensitive     CIPROFLOXACIN >=4 RESISTANT Resistant     GENTAMICIN <=1  SENSITIVE Sensitive     IMIPENEM <=0.25 SENSITIVE Sensitive     NITROFURANTOIN <=16 SENSITIVE Sensitive     TRIMETH/SULFA <=20 SENSITIVE Sensitive     AMPICILLIN/SULBACTAM 4 SENSITIVE Sensitive     PIP/TAZO <=4 SENSITIVE Sensitive     Extended ESBL NEGATIVE  Sensitive     * >=100,000 COLONIES/mL ESCHERICHIA COLI  Urine culture     Status: Abnormal (Preliminary result)   Collection Time: 09/02/15 12:17 PM  Result Value Ref Range Status   Specimen Description URINE, RANDOM  Final   Special Requests NONE  Final   Culture >=100,000 COLONIES/mL PSEUDOMONAS AERUGINOSA (A)  Final   Report Status PENDING  Incomplete  MRSA PCR Screening     Status: None   Collection Time: 09/02/15  4:50 PM  Result Value Ref Range Status   MRSA by PCR NEGATIVE NEGATIVE Final    Comment:        The GeneXpert MRSA Assay (FDA approved for NASAL specimens only), is one component of a comprehensive MRSA colonization surveillance program. It is not intended to diagnose MRSA infection nor to guide or monitor treatment for MRSA infections.   C difficile quick scan w PCR reflex     Status: None   Collection Time: 09/02/15  6:16 PM  Result Value Ref Range Status   C Diff antigen NEGATIVE NEGATIVE Final   C Diff toxin NEGATIVE NEGATIVE Final   C Diff interpretation No C. difficile detected.  Final     Coagulation Studies: No results for input(s): LABPROT, INR in the last 72 hours.  Urinalysis:  Recent Labs  09/02/15 1217  COLORURINE YELLOW*  LABSPEC 1.011  PHURINE 7.0  GLUCOSEU NEGATIVE  HGBUR 1+*  BILIRUBINUR NEGATIVE  KETONESUR NEGATIVE  PROTEINUR NEGATIVE  NITRITE NEGATIVE  LEUKOCYTESUR 3+*        Imaging:  No results found.   Assessment & Plan: Pt is a 82 y.o. yo female with medical problems of anemia, metastatic small cell lung cancer, diabetes type 2, hyperlipidemia, hypertension, hypothyroidism, recent pelvic fracture who was admitted to [facility] on 09/02/2015 for evaluation of syncope  at nursing home.  1.  Hyponatremia Baseline sodium 131 on July 29 Patient received chemotherapy with TOPOTECAN ON July 27 She presents with sodium of 111, low creatinine of 0.45, Hyponatremia is likely secondary to SIADH Agreement 3% saline at 30 cc per hour monitor sodium closely Form for correction is less than 118 in next 24 hours

## 2015-09-03 NOTE — Evaluation (Signed)
Physical Therapy Evaluation Patient Details Name: Madison Fry MRN: [id number] DOB: Jul 09, 1928 Today's Date: 09/03/2015   History of Present Illness  Pt is an 82 yr old female presenting from [place] at [facility] (following hospital admission with UTI) after a brief loss of consciousness in her chair (not associated with a fall). Admitted with hyponatremia. PMH significant for R breast CA, lung CA with mets (last chemo x2wks ago), DM, HTN, R femoral head AVN, and h/o old L superior and inferior pubic rami fx.       Clinical Impression  Prior to admission, pt was undergoing STR at SNF.  It seems that pt was working on stand pivot transfers to/from power w/c with assist.  Pt lives in a [place], level entry home.  Currently, pt is supervision for log rolling bilaterally, mod A for sit <> supine, and min-mod for sit <> stand transfers at RW x3 trials.  Pt is visibly SOB following mobility (SaO2 99% on RA) and with request to return to bed and use the bedpan.  Pt presents with generalized weakness, decreased activity tolerance, and SOB which limits her ability to participate in functional mobility.  Pt would benefit from skilled PT to address noted impairments and functional limitations.  Recommend pt discharge back to STR when medically appropriate.     Follow Up Recommendations SNF    Equipment Recommendations  Other (comment) (pt has equipment)    Recommendations for Other Services       Precautions / Restrictions Precautions Precautions: Fall Restrictions Weight Bearing Restrictions: No      Mobility  Bed Mobility Overal bed mobility: Needs Assistance Bed Mobility: Supine to Sit;Sit to Supine;Rolling Rolling: Supervision (both directions) Supine to sit: Mod assist Sit to supine: Mod assist General bed mobility comments: Pt requiring mod A at BLEs and trunk in order to perform sit <> supine. Pt relies on UE assist to participate, attempting to hold onto PTs neck, arms, and  hands; educated pt to hold onto bedrails. Pt requiring max A x2 for a scoot up in bed. Pt is able to perform a log roll in each direction to place bedpan and some degree of supine bridging for adjustment. Increased time required for all mobility.  Transfers Overall transfer level: Needs assistance Equipment used: Rolling walker (2 wheeled) Transfers: Sit to/from Stand (x 3 trials) Sit to Stand: Min assist;Mod assist General transfer comment: Vc's for hand/foot placement and proper DME use, with minimal carryover between trials. Pt unable to extend hips and trunks to achieve full stand, even with verbal and tactile cueing. Pt unable to initiate standing marching at this time.  Ambulation/Gait General Gait Details: Deferred  Stairs    Wheelchair Mobility    Modified Rankin (Stroke Patients Only)       Balance Overall balance assessment: Needs assistance Sitting-balance support: Feet supported;Bilateral upper extremity supported Sitting balance-Leahy Scale: Fair Sitting balance - Comments: Pt requiring fluctuating assist min A-min guard to maintain sitting EOB. Standing balance support: Bilateral upper extremity supported (on RW) Standing balance-Leahy Scale: Poor      Pertinent Vitals/Pain Pain Assessment: Faces Faces Pain Scale: Hurts little more Pain Location: BLEs Pain Descriptors / Indicators: Aching Pain Intervention(s): Limited activity within patient's tolerance;Monitored during session;Repositioned    Home Living Family/patient expects to be discharged to:: Skilled nursing facility Living Arrangements: Alone Available Help at Discharge: Family;Friend(s);Available PRN/intermittently Type of Home: House Home Access: Level entry Home Layout: One level Home Equipment: Walker - 2 wheels;Cane - single point;Wheelchair -  power Additional Comments: Pt reports she hasn't walked in some time, reports she is performing stand pivot transfers to/from w/c with assist.     Prior Function Level of Independence: Needs assistance  Gait / Transfers Assistance Needed: Power w/c for mobility, assist with bed mobility and transfers (at SNF for STR)     Hand Dominance   Dominant Hand: Right    Extremity/Trunk Assessment   Upper Extremity Assessment: Generalized weakness   Lower Extremity Assessment: Generalized weakness Strength at least 3/5 throughout, no deficits noted in light touch sensation  Cervical / Trunk Assessment: Kyphotic    Communication   Communication: HOH  Cognition Arousal/Alertness: Awake/alert Behavior During Therapy: WFL for tasks assessed/performed Overall Cognitive Status: Within Functional Limits for tasks assessed (Oriented to self, time, and place. Inconsistencies regarding PLOF)    General Comments General comments (skin integrity, edema, etc.): Pt left semi supine on the bed pan, instructed on how to call RN tech when finished. RN tech notified.     Exercises General Exercises - Lower Extremity Ankle Circles/Pumps: AROM Quad Sets: AROM Gluteal Sets: AROM Short Arc Quad: AROM Heel Slides: AROM Hip ABduction/ADduction: AROM Straight Leg Raises: AAROM  All exercises performed bilaterally x 10 reps in supine.       Assessment/Plan    PT Assessment Patient needs continued PT services  PT Diagnosis Generalized weakness;Difficulty walking   PT Problem List Decreased strength;Decreased activity tolerance;Decreased balance;Decreased mobility;Decreased coordination;Decreased knowledge of use of DME  PT Treatment Interventions DME instruction;Functional mobility training;Therapeutic activities;Therapeutic exercise;Balance training;Patient/family education   PT Goals (Current goals can be found in the Care Plan section) Acute Rehab PT Goals Patient Stated Goal: to get stronger PT Goal Formulation: With patient Time For Goal Achievement: 09/17/15 Potential to Achieve Goals: Fair    Frequency Min 2X/week   Barriers to  discharge  (Assist levels) lives home alone       End of Session Equipment Utilized During Treatment: Gait belt Activity Tolerance: Patient tolerated treatment well;Patient limited by fatigue Patient left: in bed;with call bell/phone within reach;with bed alarm set (on bedpan Research scientist (life sciences) aware)) Nurse Communication: Mobility status;Precautions         Time: 1000-1041 PT Time Calculation (min) (ACUTE ONLY): 41 min   Charges:         PT G Codes:        Alesa Echevarria, SPT 09/03/2015, 12:52 PM

## 2015-09-04 DIAGNOSIS — Z66 Do not resuscitate: Secondary | ICD-10-CM

## 2015-09-04 DIAGNOSIS — Z789 Other specified health status: Secondary | ICD-10-CM

## 2015-09-04 DIAGNOSIS — C3490 Malignant neoplasm of unspecified part of unspecified bronchus or lung: Secondary | ICD-10-CM

## 2015-09-04 DIAGNOSIS — Z515 Encounter for palliative care: Secondary | ICD-10-CM

## 2015-09-04 LAB — SODIUM
SODIUM: 117 mmol/L — AB (ref 135–145)
Sodium: 113 mmol/L — CL (ref 135–145)
Sodium: 115 mmol/L — CL (ref 135–145)
Sodium: 117 mmol/L — CL (ref 135–145)
Sodium: 121 mmol/L — ABNORMAL LOW (ref 135–145)

## 2015-09-04 LAB — BASIC METABOLIC PANEL
Anion gap: 7 (ref 5–15)
BUN: 7 mg/dL (ref 6–20)
CO2: 20 mmol/L — ABNORMAL LOW (ref 22–32)
CREATININE: 0.32 mg/dL — AB (ref 0.44–1.00)
Calcium: 7.1 mg/dL — ABNORMAL LOW (ref 8.9–10.3)
Chloride: 88 mmol/L — ABNORMAL LOW (ref 101–111)
GFR calc Af Amer: >60 mL/min (ref >60)
GLUCOSE: 99 mg/dL (ref 65–99)
Potassium: 3.3 mmol/L — ABNORMAL LOW (ref 3.5–5.1)
SODIUM: 115 mmol/L — AB (ref 135–145)

## 2015-09-04 LAB — URINE CULTURE

## 2015-09-04 LAB — GLUCOSE, CAPILLARY
GLUCOSE-CAPILLARY: 106 mg/dL — AB (ref 65–99)
GLUCOSE-CAPILLARY: 84 mg/dL (ref 65–99)
Glucose-Capillary: 136 mg/dL — ABNORMAL HIGH (ref 65–99)
Glucose-Capillary: 86 mg/dL (ref 65–99)

## 2015-09-04 LAB — MAGNESIUM: MAGNESIUM: 2.5 mg/dL — AB (ref 1.7–2.4)

## 2015-09-04 MED ORDER — POTASSIUM CHLORIDE CRYS ER 20 MEQ PO TBCR
40.0000 meq | EXTENDED_RELEASE_TABLET | ORAL | Status: AC
  Administered 2015-09-04 (×2): 40 meq via ORAL
  Filled 2015-09-04 (×2): qty 2

## 2015-09-04 MED ORDER — CIPROFLOXACIN IN D5W 400 MG/200ML IV SOLN
400.0000 mg | Freq: Two times a day (BID) | INTRAVENOUS | Status: DC
Start: 2015-09-04 — End: 2015-09-05
  Administered 2015-09-04 – 2015-09-05 (×3): 400 mg via INTRAVENOUS
  Filled 2015-09-04 (×4): qty 200

## 2015-09-04 NOTE — Clinical Social Work Note (Signed)
CSW informed by Amy with [facility] that there should be no problem with authorizing patient to go to [facility] for rehab and Palliative following. Amy requested to let them know when she would be ready for discharge.  Shela Leff MSW,LCSW [phone]

## 2015-09-04 NOTE — Progress Notes (Signed)
PT Cancellation Note  Patient Details Name: Madison Fry MRN: [id number] DOB: 1929-01-06   Cancelled Treatment:    Reason Eval/Treat Not Completed: Medical issues which prohibited therapy   Chart reviewed and sodium noted to be 117.  Held session.  Will attempt again tomorrow.   Chesley Noon 09/04/2015, 1:55 PM

## 2015-09-04 NOTE — NC FL2 (Signed)
[place] LEVEL OF CARE SCREENING TOOL     IDENTIFICATION  Patient Name: Madison Fry Birthdate: Jan 07, 1929 Sex: female Admission Date (Current Location): 09/02/2015  [place] and [place] Number:  Engineering geologist and Address:  [facility], [address], [place], [place] [address]      Provider Number: [id number]  Attending Physician Name and Address:  Theodoro Grist, MD  Relative Name and Phone Number:       Current Level of Care: Hospital Recommended Level of Care: [place] Prior Approval Number:    Date Approved/Denied:   PASRR Number: [id number]  Discharge Plan: SNF    Current Diagnoses: Patient Active Problem List   Diagnosis Date Noted  . Pressure ulcer 09/03/2015  . Malnutrition of moderate degree 08/25/2015  . Urinary tract infection 08/23/2015  . Metastatic small cell carcinoma to bone ([place]) 04/29/2015  . Dyspnea 04/10/2015  . Acute anxiety 04/10/2015  . Leukocytosis 04/10/2015  . Hyponatremia 04/10/2015  . Type 2 diabetes mellitus ([place]) 04/10/2015  . UTI (lower urinary tract infection) 08/19/2014  . Hip pain, acute 08/17/2014  . Elevated troponin 08/17/2014    Orientation RESPIRATION BLADDER Height & Weight     Self  Normal Incontinent Weight: 151 lb 11.2 oz (68.8 kg) Height:  '5\' 4"'$  (162.6 cm)  BEHAVIORAL SYMPTOMS/MOOD NEUROLOGICAL BOWEL NUTRITION STATUS   (none)  (none) Incontinent Diet (soft)  AMBULATORY STATUS COMMUNICATION OF NEEDS Skin   Extensive Assist Verbally PU Stage and Appropriate Care                       Personal Care Assistance Level of Assistance  Bathing, Dressing Bathing Assistance: Maximum assistance Feeding assistance: Maximum assistance Dressing Assistance: Maximum assistance     Functional Limitations Info             SPECIAL CARE FACTORS FREQUENCY  PT (By licensed PT)                    Contractures Contractures Info: Not present     Additional Factors Info  Code Status, Allergies Code Status Info: DNR Allergies Info: shellfish           Current Medications (09/04/2015):  This is the current hospital active medication list Current Facility-Administered Medications  Medication Dose Route Frequency Provider Last Rate Last Dose  . acetaminophen (TYLENOL) tablet 650 mg  650 mg Oral Q6H PRN Dustin Flock, MD   650 mg at 09/04/15 1203   Or  . acetaminophen (TYLENOL) suppository 650 mg  650 mg Rectal Q6H PRN Dustin Flock, MD      . acidophilus (RISAQUAD) capsule 2 capsule  2 capsule Oral Daily Dustin Flock, MD   2 capsule at 09/04/15 1032  . albuterol (PROVENTIL) (2.5 MG/3ML) 0.083% nebulizer solution 2.5 mg  2.5 mg Nebulization TID Theodoro Grist, MD   2.5 mg at 09/04/15 0720  . aspirin EC tablet 81 mg  81 mg Oral Daily Dustin Flock, MD   81 mg at 09/04/15 1032  . brimonidine (ALPHAGAN) 0.2 % ophthalmic solution 1 drop  1 drop Both Eyes Q12H Dustin Flock, MD   1 drop at 09/04/15 1034   And  . timolol (TIMOPTIC) 0.5 % ophthalmic solution 1 drop  1 drop Both Eyes Q12H Dustin Flock, MD   1 drop at 09/04/15 1033  . ciprofloxacin (CIPRO) IVPB 400 mg  400 mg Intravenous Q12H Theodoro Grist, MD   400 mg at 09/04/15  1102  . colesevelam [facility]) tablet 625 mg  625 mg Oral BID WC Dustin Flock, MD   625 mg at 09/04/15 0750  . enoxaparin (LOVENOX) injection 40 mg  40 mg Subcutaneous Q24H Dustin Flock, MD   40 mg at 09/03/15 1441  . ezetimibe (ZETIA) tablet 10 mg  10 mg Oral Daily Dustin Flock, MD   10 mg at 09/04/15 1032  . feeding supplement (ENSURE ENLIVE) (ENSURE ENLIVE) liquid 237 mL  237 mL Oral BID BM Theodoro Grist, MD   237 mL at 09/04/15 1119  . glimepiride (AMARYL) tablet 2 mg  2 mg Oral Q breakfast Dustin Flock, MD   2 mg at 09/04/15 0749  . HYDROcodone-acetaminophen (NORCO/VICODIN) 5-325 MG per tablet 1 tablet  1 tablet Oral Q6H PRN Theodoro Grist, MD   1 tablet at 09/04/15 0806  . insulin aspart  (novoLOG) injection 0-9 Units  0-9 Units Subcutaneous TID WC Dustin Flock, MD   1 Units at 09/04/15 1201  . latanoprost (XALATAN) 0.005 % ophthalmic solution 1 drop  1 drop Both Eyes QHS Dustin Flock, MD   1 drop at 09/04/15 0057  . levothyroxine (SYNTHROID, LEVOTHROID) tablet 50 mcg  50 mcg Oral QAC breakfast Dustin Flock, MD   50 mcg at 09/04/15 0749  . LORazepam (ATIVAN) tablet 0.5 mg  0.5 mg Oral Q6H PRN Dustin Flock, MD      . megestrol (MEGACE) 400 MG/10ML suspension 400 mg  400 mg Oral BID Dustin Flock, MD   400 mg at 09/04/15 1034  . metFORMIN (GLUCOPHAGE-XR) 24 hr tablet 500 mg  500 mg Oral BID WC Dustin Flock, MD   500 mg at 09/04/15 0750  . ondansetron (ZOFRAN) tablet 4 mg  4 mg Oral Q6H PRN Dustin Flock, MD       Or  . ondansetron (ZOFRAN) injection 4 mg  4 mg Intravenous Q6H PRN Dustin Flock, MD      . sodium chloride (hypertonic) 3 % solution   Intravenous Continuous Theodoro Grist, MD 30 mL/hr at 09/03/15 1935 30 mL/hr at 09/03/15 1935  . sodium chloride flush (NS) 0.9 % injection 3 mL  3 mL Intravenous Q12H Dustin Flock, MD   3 mL at 09/04/15 1102  . verapamil (CALAN-SR) CR tablet 120 mg  120 mg Oral Daily Dustin Flock, MD   120 mg at 09/04/15 1032   Facility-Administered Medications Ordered in Other Encounters  Medication Dose Route Frequency Provider Last Rate Last Dose  . 0.9 %  sodium chloride infusion   Intravenous Continuous Lequita Asal, MD         Discharge Medications: Please see discharge summary for a list of discharge medications.  Relevant Imaging Results:  Relevant Lab Results:   Additional Information SS: [id number]  Shela Leff, LCSW

## 2015-09-04 NOTE — Plan of Care (Addendum)
Problem: Physical Regulation: Goal: Ability to maintain clinical measurements within normal limits will improve Outcome: Progressing Na+ serum 117. Continue on NaCl 3%@ 28m/hr. Soduim serum checks qx3hrs. Pt a&ox3. Cooperative and pleasant. Problem: Fluid Volume: Goal: Ability to maintain a balanced intake and output will improve Outcome: Progressing Fair appetite. Pt eats some food that family brought. Pt encouraged to eat and drink. Ensure given. No c/o n/v.

## 2015-09-04 NOTE — Consult Note (Signed)
Consultation Note Date: 09/04/2015   Patient Name: Madison Fry  DOB: 09-Jan-1929  MRN: [id number]  Age / Sex: 82 y.o., female  PCP: Madison Neer, MD Referring Physician: Theodoro Grist, MD  Reason for Consultation: Establishing goals of care and Psychosocial/spiritual support  HPI/Patient Profile:   82 y.o. female  admitted on 09/02/2015 with a known history of anemia, lung cancer with metastases to the bone, diabetes type 2, hyperlipidemia, hypertension, hypothyroidism who was recently hospitalized with a urinary tract infection and was discharged to a rehabilitation facility.   Continued phsycial, fucntional and cognitive decline over the past several months. Generalized weakness and poor po intake.  Dr Madison Fry is her oncologist   Family/patient are faced with advanced directive decisions and anticipatory care needs.  Only realistic support at home is her daughter who has her own health issues and she is unable to care for this patient at home as her care needs increase.  Clinical Assessment and Goals of Care:   This NP Madison Fry reviewed medical records, received report from team, assessed the patient and then meet at the patient's bedside along with her daughter/Madison Fry to discuss diagnosis, prognosis, GOC, EOL wishes disposition and options.  A detailed discussion was had today regarding advanced directives.  Concepts specific to code status, artifical feeding and hydration, continued IV antibiotics and rehospitalization was had.  The difference between a aggressive medical intervention path  and a palliative comfort care path for this patient at this time was had.  Values and goals of care important to patient and family were attempted to be elicited.  MOST form introduced  Concept of Hospice and Palliative Care were discussed  Natural trajectory and expectations at EOL were discussed.   Questions and concerns addressed.   Family encouraged to call with questions or concerns.  PMT will continue to support holistically.    SUMMARY OF RECOMMENDATIONS    Code Status/Advance Care Planning:  DNR   Palliative Prophylaxis:   Aspiration, Bowel Regimen, Delirium Protocol, Frequent Pain Assessment and Oral Care  Additional Recommendations (Limitations, Scope, Preferences):  Avoid Hospitalization and No Artificial Feeding   Comfort is the main priority.  Family is processing what a comfort path entails for them  Psycho-social/Spiritual:   Desire for further Chaplaincy support:no  Additional Recommendations: Education on Hospice  Prognosis:   < 6 months , likely much less dependant on desire for life prolonging intervetnions   Discharge Planning:  This is a difficult situation, patient is too weak/and has no desire  to participate in rehabilitaion, and too much care for family at home.   She is hospice eligible with a prognosis of 6 months or less.  Family is hopeful for SNF for long term care with PCS/hospice. High risk for rapid decompensation      Primary Diagnoses: Present on Admission: . Hyponatremia   I have reviewed the medical record, interviewed the patient and family, and examined the patient. The following aspects are pertinent.  Past Medical History:  Diagnosis Date  . Anemia   .  Breast cancer ([place])   . Cancer [facility])    breast cancer, Right side lymph nodes removed  . Diabetes mellitus without complication ([place])   . Glaucoma   . Hyperlipidemia   . Hyperlipidemia   . Hypertension   . Lung cancer ([place])   . Thyroid disease    Social History   Social History  . Marital status: Widowed    Spouse name: N/A  . Number of children: N/A  . Years of education: N/A   Social History Main Topics  . Smoking status: Former Smoker    Packs/day: 1.50    Years: 40.00    Types: Cigarettes    Quit date: 08/27/1990  . Smokeless tobacco: Never Used  .  Alcohol use No  . Drug use: No  . Sexual activity: No   Other Topics Concern  . None   Social History Narrative  . None   Family History  Problem Relation Age of Onset  . Bone cancer Father   . Stroke Brother    Scheduled Meds: . acidophilus  2 capsule Oral Daily  . albuterol  2.5 mg Nebulization TID  . aspirin EC  81 mg Oral Daily  . brimonidine  1 drop Both Eyes Q12H   And  . timolol  1 drop Both Eyes Q12H  . cefTAZidime (FORTAZ)  IV  1 g Intravenous Q8H  . colesevelam  625 mg Oral BID WC  . enoxaparin (LOVENOX) injection  40 mg Subcutaneous Q24H  . ezetimibe  10 mg Oral Daily  . feeding supplement (ENSURE ENLIVE)  237 mL Oral BID BM  . glimepiride  2 mg Oral Q breakfast  . insulin aspart  0-9 Units Subcutaneous TID WC  . latanoprost  1 drop Both Eyes QHS  . levothyroxine  50 mcg Oral QAC breakfast  . megestrol  400 mg Oral BID  . metFORMIN  500 mg Oral BID WC  . potassium chloride  40 mEq Oral BID  . potassium chloride  40 mEq Oral Q4H  . sodium chloride flush  3 mL Intravenous Q12H  . verapamil  120 mg Oral Daily   Continuous Infusions: . sodium chloride (hypertonic) 30 mL/hr (09/03/15 1935)   PRN Meds:.acetaminophen **OR** acetaminophen, HYDROcodone-acetaminophen, LORazepam, ondansetron **OR** ondansetron (ZOFRAN) IV Medications Prior to Admission:  Prior to Admission medications   Medication Sig Start Date End Date Taking? Authorizing Provider  albuterol (PROVENTIL) (2.5 MG/3ML) 0.083% nebulizer solution Take 3 mLs (2.5 mg total) by nebulization 3 (three) times daily. 04/29/15  Yes Laverle Hobby, MD  aspirin EC 81 MG tablet Take 81 mg by mouth daily.   Yes Historical Provider, MD  cephALEXin (KEFLEX) 250 MG capsule Take 1 capsule (250 mg total) by mouth 3 (three) times daily. 08/26/15  Yes Srikar Sudini, MD  colesevelam (WELCHOL) 625 MG tablet Take by mouth 2 (two) times daily with a meal.   Yes Historical Provider, MD  COMBIGAN 0.2-0.5 % ophthalmic solution  PUT 1 DROP IN AFFECTED EYE EVERY 12 HOURS**NEED OFFICE VISIT** 03/08/15  Yes Historical Provider, MD  ezetimibe (ZETIA) 10 MG tablet Take 10 mg by mouth daily.   Yes Historical Provider, MD  glimepiride (AMARYL) 2 MG tablet Take 2 mg by mouth daily with breakfast.   Yes Historical Provider, MD  HYDROcodone-acetaminophen (NORCO/VICODIN) 5-325 MG tablet Take 1 tablet by mouth every 6 (six) hours as needed for severe pain. 08/26/15  Yes Srikar Sudini, MD  levothyroxine (SYNTHROID, LEVOTHROID) 50 MCG tablet Take 50 mcg by mouth  daily.   Yes Historical Provider, MD  LORazepam (ATIVAN) 0.5 MG tablet Take 0.5 mg by mouth every 6 (six) hours as needed for anxiety.   Yes Historical Provider, MD  losartan (COZAAR) 100 MG tablet Take 100 mg by mouth daily.   Yes Historical Provider, MD  metFORMIN (GLUCOPHAGE-XR) 500 MG 24 hr tablet Take 1 tablet (500 mg total) by mouth 2 (two) times daily. 08/26/15  Yes Srikar Sudini, MD  TRAVATAN Z 0.004 % SOLN ophthalmic solution PUT 1 DROP INTO BOTH EYES AT BEDTIME 03/16/15  Yes Historical Provider, MD  verapamil (CALAN-SR) 120 MG CR tablet Take 120 mg by mouth daily.   Yes Historical Provider, MD   Allergies  Allergen Reactions  . Shellfish Allergy Swelling   Review of Systems  Constitutional: Positive for activity change and appetite change.  Endocrine: Positive for cold intolerance.  Neurological: Positive for weakness.    Physical Exam  Constitutional: She appears cachectic. She appears ill.  Cardiovascular: Normal rate, regular rhythm and normal heart sounds.   Pulmonary/Chest: She has decreased breath sounds in the right lower field and the left lower field.  Musculoskeletal:  -generalized weakness  Neurological: She is alert.  Skin: Skin is warm and dry.    Vital Signs: BP (!) 133/58 (BP Location: Right Arm)   Pulse 96   Temp 98.4 F (36.9 C) (Oral)   Resp 18   Ht '5\' 4"'$  (1.626 m)   Wt 68.8 kg (151 lb 11.2 oz)   SpO2 100%   BMI 26.04 kg/m  Pain  Assessment: 0-10   Pain Score: 8    SpO2: SpO2: 100 % O2 Device:SpO2: 100 % O2 Flow Rate: .   IO: Intake/output summary:  Intake/Output Summary (Last 24 hours) at 09/04/15 0859 Last data filed at 09/04/15 0525  Gross per 24 hour  Intake            857.5 ml  Output                0 ml  Net            857.5 ml    LBM: Last BM Date: 09/02/15 Baseline Weight: Weight: 70.1 kg (154 lb 8 oz) Most recent weight: Weight: 68.8 kg (151 lb 11.2 oz)      Palliative Assessment/Data: 30 %     Time In: 0830 Time Out: 0945 Time Total: 75 min Greater than 50%  of this time was spent counseling and coordinating care related to the above assessment and plan.  Signed by: Madison Lessen, NP   Please contact Palliative Medicine Team phone at [phone] for questions and concerns.  For individual provider: See Shea Evans

## 2015-09-04 NOTE — Progress Notes (Signed)
[facility] NAME: Rudell Marlowe    MR#:  [id number]  DATE OF BIRTH:  Aug 16, 1928  SUBJECTIVE:  CHIEF COMPLAINT:   Chief Complaint  Patient presents with  . Loss of Consciousness  The patient is a 82 year old Caucasian female with past history significant for history of anemia, lung cancer with metastasis to bone, diabetes mellitus, hyperlipidemia, hypertension, hypothyroidism, who was sent to the hospitalist for urinary tract infection and discharged to rehabilitation facility comes back with weakness and episode of syncope, inability to eat or drink, diarrhea, altered mental status/drowsiness. On arrival to the hospital, she was noted to have sodium level of 111 and was admitted for IV fluid administration, Unfortunately, patient's sodium level did not improve on normal saline administration, but worsened, concerning for SIADH. Urine osmolality was high. In comparison to serum. The patient was initiated on 3% sodium solution and improved today. Her sodium level also has improved to 117 over the past 20 hours. She is eating better and complains of no pain.   Review of Systems  Constitutional: Negative for chills, fever and weight loss.  HENT: Negative for congestion.   Eyes: Negative for blurred vision and double vision.  Respiratory: Negative for cough, sputum production, shortness of breath and wheezing.   Cardiovascular: Negative for chest pain, palpitations, orthopnea, leg swelling and PND.  Gastrointestinal: Negative for abdominal pain, blood in stool, constipation, diarrhea, nausea and vomiting.  Genitourinary: Negative for dysuria, frequency, hematuria and urgency.  Musculoskeletal: Negative for falls.  Neurological: Positive for weakness. Negative for dizziness, tremors, focal weakness and headaches.  Endo/Heme/Allergies: Does not bruise/bleed easily.  Psychiatric/Behavioral: Negative for depression. The patient does not  have insomnia.     VITAL SIGNS: Blood pressure (!) 99/47, pulse 70, temperature 97.6 F (36.4 C), resp. rate 18, height '5\' 4"'$  (1.626 m), weight 68.8 kg (151 lb 11.2 oz), SpO2 100 %.  PHYSICAL EXAMINATION:   GENERAL:  82 y.o.-year-old patient lying in the bed with no acute distress. Sitting upright and eating breakfast, not in distress.  EYES: Pupils equal, round, reactive to light and accommodation. No scleral icterus. Extraocular muscles intact.  HEENT: Head atraumatic, normocephalic. Oropharynx and nasopharynx clear.  NECK:  Supple, no jugular venous distention. No thyroid enlargement, no tenderness.  LUNGS: Normal breath sounds bilaterally, no wheezing, rales,rhonchi or crepitation, poor effort. No use of accessory muscles of respiration.  CARDIOVASCULAR: S1, S2 normal. No murmurs, rubs, or gallops.  ABDOMEN: Soft, nontender, nondistended. Bowel sounds present. No organomegaly or mass.  EXTREMITIES: No pedal edema, cyanosis, or clubbing.  NEUROLOGIC: Cranial nerves II through XII are grossly intact. Muscle strength 4 out of 5, although evaluation is limited due to generalized weakness. Sensation grossly intact. Gait not checked.  PSYCHIATRIC: The patient is somnolent .  SKIN: No obvious rash, lesion, or ulcer.   ORDERS/RESULTS REVIEWED:   CBC  Recent Labs Lab 09/02/15 1205 09/03/15 0520  WBC 4.6 5.1  HGB 9.8* 10.5*  HCT 27.5* 29.6*  PLT 269 264  MCV 87.5 88.4  MCH 31.1 31.2  MCHC 35.6 35.3  RDW 17.0* 17.3*   ------------------------------------------------------------------------------------------------------------------  Chemistries   Recent Labs Lab 09/02/15 1205 09/03/15 0520 09/03/15 1244 09/03/15 2115 09/04/15 0100 09/04/15 0520 09/04/15 0842 09/04/15 1205  NA 111* 112* 110* 112* 113* 115* 115* 117*  K 4.0 3.2* 3.0*  --   --  3.3*  --   --   CL 80* 81* 81*  --   --  88*  --   --   CO2 23 22 20*  --   --  20*  --   --   GLUCOSE 117* 110* 108*  --   --  99   --   --   BUN 8 <5* 6  --   --  7  --   --   CREATININE 0.45 0.31* <0.30*  --   --  0.32*  --   --   CALCIUM 7.9* 7.6* 7.3*  --   --  7.1*  --   --   MG  --   --  1.4*  --   --  2.5*  --   --    ------------------------------------------------------------------------------------------------------------------ estimated creatinine clearance is 49.8 mL/min (by C-G formula based on SCr of 0.8 mg/dL). ------------------------------------------------------------------------------------------------------------------ No results for input(s): TSH, T4TOTAL, T3FREE, THYROIDAB in the last 72 hours.  Invalid input(s): FREET3  Cardiac Enzymes No results for input(s): CKMB, TROPONINI, MYOGLOBIN in the last 168 hours.  Invalid input(s): CK ------------------------------------------------------------------------------------------------------------------ Invalid input(s): POCBNP ---------------------------------------------------------------------------------------------------------------  RADIOLOGY: Dg Chest 1 View  Result Date: 09/03/2015 CLINICAL DATA:  PICC placement. EXAM: CHEST 1 VIEW COMPARISON:  Chest radiograph 08/23/2015.  Chest CT 07/12/2015 FINDINGS: Tip of the left upper extremity PICC in the mid SVC. No pneumothorax. There is volume loss in the right lung. Density at the medial right lung base may be lower lobe atelectasis/ collapse versus loculated pleural effusion. Linear opacity in the right upper lobe, which has a similar configuration to prior CT. No focal abnormality in the left lung. Heart is likely normal in size. IMPRESSION: 1. Tip of the left upper extremity PICC in the mid SVC. 2. Volume loss in the right lung with opacity at the medial right lung base. This may be lower lobe atelectasis/collapse versus loculated pleural effusion. 3. Linear right upper lobe opacity appears similar to prior CT in may be sequela of treated lung cancer. Electronically Signed   By: Jeb Levering M.D.    On: 09/03/2015 19:10    EKG:  Orders placed or performed during the hospital encounter of 09/02/15  . ED EKG  . ED EKG    ASSESSMENT AND PLAN:  Active Problems:   Hyponatremia   Pressure ulcer #1. Severe hyponatremia, SIADH, Improved on 3% sodium chloride solution, following sodium levels every 3 hours, appreciate nephrologist recommendations #2 metabolic encephalopathy due to severe hyponatremia, improved, following #3 hypotension,  likely due to malnutrition, sepsis related, some improvement with 3% sodium solution, boluses of IV fluids if needed, palliative care is consulted, pending #4. Hypokalemia, magnesium level was found to be in normal high, reassess his potassium in the morning, supplementation was given orally #5. Sepsis due to Urinary tract infection, urine cultures revealed Pseudomonas aeruginosa, change antibiotic therapy to ciprofloxacin, since cultures reveal intermediate sensitivity to [facility]  Management plans discussed with the patient, family and they are in agreement.   DRUG ALLERGIES:  Allergies  Allergen Reactions  . Shellfish Allergy Swelling    CODE STATUS:     Code Status Orders        Start     Ordered   09/02/15 1336  Do not attempt resuscitation (DNR)  Continuous    Question Answer Comment  In the event of cardiac or respiratory ARREST Do not call a "code blue"   In the event of cardiac or respiratory ARREST Do not perform Intubation, CPR, defibrillation or ACLS   In the event of cardiac or respiratory ARREST Use medication  by any route, position, wound care, and other measures to relive pain and suffering. May use oxygen, suction and manual treatment of airway obstruction as needed for comfort.      09/02/15 1335    Code Status History    Date Active Date Inactive Code Status Order ID Comments User Context   09/02/2015  1:35 PM 09/02/2015  1:35 PM Full Code [id number]  Dustin Flock, MD ED   08/23/2015  8:22 PM 08/26/2015  9:25 PM DNR [id number]   Henreitta Leber, MD Inpatient   04/10/2015  9:47 PM 04/12/2015  4:42 PM Full Code [id number]  Vianne Bulls, MD ED   08/17/2014 12:22 PM 08/20/2014  3:56 PM Full Code [id number]  Nicholes Mango, MD Inpatient    Advance Directive Documentation   Flowsheet Row Most Recent Value  Type of Advance Directive  Healthcare Power of Attorney  Pre-existing out of facility DNR order (yellow form or pink MOST form)  No data  "MOST" Form in Place?  No data      TOTAL Critical care TIME TAKING CARE OF THIS PATIENT: 30 minutes.    Theodoro Grist M.D on 09/04/2015 at 5:04 PM  Between 7am to 6pm - Pager - [phone]  After 6pm go to [email or web address] - password [email or web address] [facility]  Office  [phone]  CC: Primary care physician; Mayra Neer, MD

## 2015-09-04 NOTE — Care Management (Signed)
Informed that projected discharge disposition for patient is to return to skilled nursing facility under hospice.  Spoke with patient's daughterBarbaraann Fry  [phone] and she confirms that this is her wish.  Agency preference would be [facility].    Would like for patient to go to [facility] because it is 5 minutes from her house.  Discussed that patient does not have medicaid and if returned to a skilled nursing facility under hospice plan of care, medicare would not cover. Also discussed that medicare would not continue to cover skilled nursing level of care if patient not able to participate in therapy and that insurance company will have to approve return to the skilled nursing. Discussed could be followed by palliative care under medicare while receiving skilled rehab. . Discussed that may not be able to place patient at [facility] if no bed offer.  Neoma Laming says that she can not provide care for patient so returning home with hospice is not an option.  Says that patient does not have financial resources to pay privately.  Referred to Burnetta Sabin to initiate a medicaid application asap.  It is hoped she will be onsite to meet with patient's daughter.  Updated CSW

## 2015-09-04 NOTE — Clinical Social Work Note (Signed)
Clinical Social Work Assessment  Patient Details  Name: Madison Fry MRN: [id number] Date of Birth: 05/19/1928  Date of referral:  09/04/15               Reason for consult:  Facility Placement                Permission sought to share information with:    Permission granted to share information::     Name::        Agency::     Relationship::     Contact Information:     Housing/Transportation Living arrangements for the past 2 months:  Skilled Nursing Facility Source of Information:  Adult Children Patient Interpreter Needed:  None Criminal Activity/Legal Involvement Pertinent to Current Situation/Hospitalization:  No - Comment as needed Significant Relationships:  Adult Children Lives with:  Facility Resident Do you feel safe going back to the place where you live?  Yes Need for family participation in patient care:  Yes (Comment)  Care giving concerns:  Patient has been at [facility] for STR.   Social Worker assessment / plan:  CSW met with patient's daughter, in patient's room this afternoon. Patient's daughter had already met with the Rn CM regarding discharge options. Patient's daughter confirmed with CSW that she is unable to care for patient at home. CSW confirmed with patient's daughter that she wished for patient to go to [facility] under her rehab benefit if the insurance would approve and to have a Palliative Care Consult to follow her at the facility. CSW informed patient's daughter that [facility] was able to offer and that we were following up with patient's insurance to see if they would approve. CSW informed patient's daughter that PT had recommended STR as well. CSW awaiting to hear from [employer] at this time.   Employment status:  Retired Insurance information:  Managed Medicare PT Recommendations:  Skilled Nursing Facility Information / Referral to community resources:  Skilled Nursing Facility  Patient/Family's Response to care:   Patient's daughter expressed much appreciation for CSW visit.   Patient/Family's Understanding of and Emotional Response to Diagnosis, Current Treatment, and Prognosis:  Patient's daughter grateful to have received a bed offer from a facility that she had wanted as it is much closer to her home.   Emotional Assessment Appearance:  Appears stated age Attitude/Demeanor/Rapport:   (patient nonverbal during assessment) Affect (typically observed):  Quiet, Calm Orientation:  Fluctuating Orientation (Suspected and/or reported Sundowners) Alcohol / Substance use:  Not Applicable Psych involvement (Current and /or in the community):  No (Comment)  Discharge Needs  Concerns to be addressed:  Care Coordination Readmission within the last 30 days:  Yes Current discharge risk:  Terminally ill Barriers to Discharge:  No Barriers Identified   Monica Marra, LCSW 09/04/2015, 12:47 PM  

## 2015-09-04 NOTE — Progress Notes (Signed)
Subjective:   doing better today Mental status is a lot better defined stay she is able to eat some without nausea or vomiting Family members at bedside Sodium level improved to 115 this morning  Objective:  Vital signs in last 24 hours:  Temp:  [97.8 F (36.6 C)-98.4 F (36.9 C)] 98.4 F (36.9 C) (08/09 0448) Pulse Rate:  [78-96] 96 (08/09 0838) Resp:  [18-20] 18 (08/09 0838) BP: (97-133)/(51-96) 133/58 (08/09 0838) SpO2:  [98 %-100 %] 100 % (08/09 0838)  Weight change:  Filed Weights   09/02/15 1151 09/02/15 1700  Weight: 70.1 kg (154 lb 8 oz) 68.8 kg (151 lb 11.2 oz)    Intake/Output:    Intake/Output Summary (Last 24 hours) at 09/04/15 1137 Last data filed at 09/04/15 0525  Gross per 24 hour  Intake            807.5 ml  Output                0 ml  Net            807.5 ml     Physical Exam: General: No acute distress  HEENT Anicteric, moist mucous membranes  Neck supple  Pulm/lungs Lungs are clear to auscultation  CVS/Heart Regular, no rub or gallop  Abdomen:  Soft, nondistended, nontender  Extremities: No peripheral edema  Neurologic: Alert, oriented  Skin: No acute rashes          Basic Metabolic Panel:   Recent Labs Lab 09/02/15 1205 09/03/15 0520 09/03/15 1244 09/03/15 2115 09/04/15 0100 09/04/15 0520 09/04/15 0842  NA 111* 112* 110* 112* 113* 115* 115*  K 4.0 3.2* 3.0*  --   --  3.3*  --   CL 80* 81* 81*  --   --  88*  --   CO2 23 22 20*  --   --  20*  --   GLUCOSE 117* 110* 108*  --   --  99  --   BUN 8 <5* 6  --   --  7  --   CREATININE 0.45 0.31* <0.30*  --   --  0.32*  --   CALCIUM 7.9* 7.6* 7.3*  --   --  7.1*  --   MG  --   --  1.4*  --   --  2.5*  --      CBC:  Recent Labs Lab 09/02/15 1205 09/03/15 0520  WBC 4.6 5.1  HGB 9.8* 10.5*  HCT 27.5* 29.6*  MCV 87.5 88.4  PLT 269 264      Microbiology:  Recent Results (from the past 720 hour(s))  Urine culture     Status: Abnormal   Collection Time: 08/08/15  9:30  AM  Result Value Ref Range Status   Specimen Description URINE, CLEAN CATCH  Final   Special Requests NONE  Final   Culture >=100,000 COLONIES/mL ESCHERICHIA COLI (A)  Final   Report Status 08/10/2015 FINAL  Final   Organism ID, Bacteria ESCHERICHIA COLI (A)  Final      Susceptibility   Escherichia coli - MIC*    AMPICILLIN 8 SENSITIVE Sensitive     CEFAZOLIN <=4 SENSITIVE Sensitive     CEFTRIAXONE <=1 SENSITIVE Sensitive     CIPROFLOXACIN >=4 RESISTANT Resistant     GENTAMICIN <=1 SENSITIVE Sensitive     IMIPENEM <=0.25 SENSITIVE Sensitive     NITROFURANTOIN <=16 SENSITIVE Sensitive     TRIMETH/SULFA <=20 SENSITIVE Sensitive     AMPICILLIN/SULBACTAM  4 SENSITIVE Sensitive     PIP/TAZO <=4 SENSITIVE Sensitive     Extended ESBL NEGATIVE Sensitive     * >=100,000 COLONIES/mL ESCHERICHIA COLI  Urine culture     Status: Abnormal   Collection Time: 08/19/15  1:04 PM  Result Value Ref Range Status   Specimen Description URINE, CLEAN CATCH  Final   Special Requests NONE  Final   Culture >=100,000 COLONIES/mL ESCHERICHIA COLI (A)  Final   Report Status 08/21/2015 FINAL  Final   Organism ID, Bacteria ESCHERICHIA COLI (A)  Final      Susceptibility   Escherichia coli - MIC*    AMPICILLIN 16 INTERMEDIATE Intermediate     CEFAZOLIN <=4 SENSITIVE Sensitive     CEFTRIAXONE <=1 SENSITIVE Sensitive     CIPROFLOXACIN >=4 RESISTANT Resistant     GENTAMICIN <=1 SENSITIVE Sensitive     IMIPENEM <=0.25 SENSITIVE Sensitive     NITROFURANTOIN <=16 SENSITIVE Sensitive     TRIMETH/SULFA <=20 SENSITIVE Sensitive     AMPICILLIN/SULBACTAM 4 SENSITIVE Sensitive     PIP/TAZO <=4 SENSITIVE Sensitive     Extended ESBL NEGATIVE Sensitive     * >=100,000 COLONIES/mL ESCHERICHIA COLI  Urine culture     Status: Abnormal   Collection Time: 09/02/15 12:17 PM  Result Value Ref Range Status   Specimen Description URINE, RANDOM  Final   Special Requests NONE  Final   Culture >=100,000 COLONIES/mL PSEUDOMONAS  AERUGINOSA (A)  Final   Report Status 09/04/2015 FINAL  Final   Organism ID, Bacteria PSEUDOMONAS AERUGINOSA (A)  Final      Susceptibility   Pseudomonas aeruginosa - MIC*    CEFTAZIDIME 16 INTERMEDIATE Intermediate     CIPROFLOXACIN <=0.25 SENSITIVE Sensitive     GENTAMICIN 2 SENSITIVE Sensitive     IMIPENEM 2 SENSITIVE Sensitive     PIP/TAZO 8 SENSITIVE Sensitive     CEFEPIME 4 INTERMEDIATE Intermediate     * >=100,000 COLONIES/mL PSEUDOMONAS AERUGINOSA  MRSA PCR Screening     Status: None   Collection Time: 09/02/15  4:50 PM  Result Value Ref Range Status   MRSA by PCR NEGATIVE NEGATIVE Final    Comment:        The GeneXpert MRSA Assay (FDA approved for NASAL specimens only), is one component of a comprehensive MRSA colonization surveillance program. It is not intended to diagnose MRSA infection nor to guide or monitor treatment for MRSA infections.   C difficile quick scan w PCR reflex     Status: None   Collection Time: 09/02/15  6:16 PM  Result Value Ref Range Status   C Diff antigen NEGATIVE NEGATIVE Final   C Diff toxin NEGATIVE NEGATIVE Final   C Diff interpretation No C. difficile detected.  Final    Coagulation Studies: No results for input(s): LABPROT, INR in the last 72 hours.  Urinalysis:  Recent Labs  09/02/15 1217  COLORURINE YELLOW*  LABSPEC 1.011  PHURINE 7.0  GLUCOSEU NEGATIVE  HGBUR 1+*  BILIRUBINUR NEGATIVE  KETONESUR NEGATIVE  PROTEINUR NEGATIVE  NITRITE NEGATIVE  LEUKOCYTESUR 3+*      Imaging: Dg Chest 1 View  Result Date: 09/03/2015 CLINICAL DATA:  PICC placement. EXAM: CHEST 1 VIEW COMPARISON:  Chest radiograph 08/23/2015.  Chest CT 07/12/2015 FINDINGS: Tip of the left upper extremity PICC in the mid SVC. No pneumothorax. There is volume loss in the right lung. Density at the medial right lung base may be lower lobe atelectasis/ collapse versus loculated pleural effusion. Linear opacity  in the right upper lobe, which has a similar  configuration to prior CT. No focal abnormality in the left lung. Heart is likely normal in size. IMPRESSION: 1. Tip of the left upper extremity PICC in the mid SVC. 2. Volume loss in the right lung with opacity at the medial right lung base. This may be lower lobe atelectasis/collapse versus loculated pleural effusion. 3. Linear right upper lobe opacity appears similar to prior CT in may be sequela of treated lung cancer. Electronically Signed   By: Jeb Levering M.D.   On: 09/03/2015 19:10     Medications:   . sodium chloride (hypertonic) 30 mL/hr (09/03/15 1935)   . acidophilus  2 capsule Oral Daily  . albuterol  2.5 mg Nebulization TID  . aspirin EC  81 mg Oral Daily  . brimonidine  1 drop Both Eyes Q12H   And  . timolol  1 drop Both Eyes Q12H  . ciprofloxacin  400 mg Intravenous Q12H  . colesevelam  625 mg Oral BID WC  . enoxaparin (LOVENOX) injection  40 mg Subcutaneous Q24H  . ezetimibe  10 mg Oral Daily  . feeding supplement (ENSURE ENLIVE)  237 mL Oral BID BM  . glimepiride  2 mg Oral Q breakfast  . insulin aspart  0-9 Units Subcutaneous TID WC  . latanoprost  1 drop Both Eyes QHS  . levothyroxine  50 mcg Oral QAC breakfast  . megestrol  400 mg Oral BID  . metFORMIN  500 mg Oral BID WC  . sodium chloride flush  3 mL Intravenous Q12H  . verapamil  120 mg Oral Daily   acetaminophen **OR** acetaminophen, HYDROcodone-acetaminophen, LORazepam, ondansetron **OR** ondansetron (ZOFRAN) IV  Assessment/ Plan:  82 y.o. female female with medical problems of anemia, metastatic small cell lung cancer, diabetes type 2, hyperlipidemia, hypertension, hypothyroidism, recent pelvic fracture who was admitted to [facility] on 09/02/2015 for evaluation of syncope at nursing home.  1.  Hyponatremia Baseline sodium 131 on July 29 Patient received chemotherapy with TOPOTECAN ON July 27 after which she reports that she got really sick,  lost appetite, diarrhea, nausea She presents with sodium of 111,  low creatinine of 0.45, Hyponatremia is likely secondary to SIADH Agree with 3% saline at 30 cc per hour monitor sdium closely Goal for correction is to 123 by tomorrow AM   LOS: 2 Anothony Bursch 8/9/201711:37 AM

## 2015-09-05 DIAGNOSIS — E876 Hypokalemia: Secondary | ICD-10-CM

## 2015-09-05 DIAGNOSIS — A498 Other bacterial infections of unspecified site: Secondary | ICD-10-CM

## 2015-09-05 DIAGNOSIS — E222 Syndrome of inappropriate secretion of antidiuretic hormone: Secondary | ICD-10-CM

## 2015-09-05 DIAGNOSIS — G9341 Metabolic encephalopathy: Secondary | ICD-10-CM

## 2015-09-05 DIAGNOSIS — N390 Urinary tract infection, site not specified: Secondary | ICD-10-CM

## 2015-09-05 DIAGNOSIS — I959 Hypotension, unspecified: Secondary | ICD-10-CM

## 2015-09-05 LAB — BASIC METABOLIC PANEL
ANION GAP: 3 — AB (ref 5–15)
BUN: 7 mg/dL (ref 6–20)
CALCIUM: 7 mg/dL — AB (ref 8.9–10.3)
CO2: 20 mmol/L — ABNORMAL LOW (ref 22–32)
Chloride: 99 mmol/L — ABNORMAL LOW (ref 101–111)
Creatinine, Ser: 0.42 mg/dL — ABNORMAL LOW (ref 0.44–1.00)
GLUCOSE: 82 mg/dL (ref 65–99)
POTASSIUM: 4.1 mmol/L (ref 3.5–5.1)
SODIUM: 122 mmol/L — AB (ref 135–145)

## 2015-09-05 LAB — GLUCOSE, CAPILLARY
GLUCOSE-CAPILLARY: 97 mg/dL (ref 65–99)
GLUCOSE-CAPILLARY: 117 mg/dL — AB (ref 65–99)

## 2015-09-05 LAB — SODIUM: SODIUM: 124 mmol/L — AB (ref 135–145)

## 2015-09-05 MED ORDER — HYDROCODONE-ACETAMINOPHEN 5-325 MG PO TABS: 1.0000 | ORAL_TABLET | Freq: Four times a day (QID) | ORAL | 0 refills | Status: AC | PRN

## 2015-09-05 MED ORDER — RISAQUAD PO CAPS: 2.0000 | ORAL_CAPSULE | Freq: Every day | ORAL | 5 refills | Status: AC

## 2015-09-05 MED ORDER — CIPROFLOXACIN HCL 250 MG PO TABS: 250.0000 mg | ORAL_TABLET | Freq: Two times a day (BID) | ORAL | 0 refills | Status: AC

## 2015-09-05 MED ORDER — ENSURE ENLIVE PO LIQD: 237.0000 mL | Freq: Two times a day (BID) | ORAL | 12 refills | Status: AC

## 2015-09-05 MED ORDER — MEGESTROL ACETATE 400 MG/10ML PO SUSP: 400.0000 mg | Freq: Two times a day (BID) | ORAL | 0 refills | Status: AC

## 2015-09-05 NOTE — Progress Notes (Signed)
Subjective:   doing better today Mental status is a lot better she is able to eat some without nausea or vomiting Family members at bedside Sodium level improved to 122 this morning  Objective:  Vital signs in last 24 hours:  Temp:  [97.6 F (36.4 C)-99.3 F (37.4 C)] 99.3 F (37.4 C) (08/10 0511) Pulse Rate:  [70-109] 109 (08/10 0843) Resp:  [16-20] 20 (08/10 0843) BP: (99-130)/(46-55) 130/53 (08/10 0843) SpO2:  [94 %-100 %] 100 % (08/10 0843)  Weight change:  Filed Weights   09/02/15 1151 09/02/15 1700  Weight: 70.1 kg (154 lb 8 oz) 68.8 kg (151 lb 11.2 oz)    Intake/Output:    Intake/Output Summary (Last 24 hours) at 09/05/15 1103 Last data filed at 09/05/15 0900  Gross per 24 hour  Intake             1810 ml  Output                0 ml  Net             1810 ml     Physical Exam: General: No acute distress  HEENT Anicteric, moist mucous membranes  Neck supple  Pulm/lungs Lungs are clear to auscultation  CVS/Heart Regular, no rub or gallop  Abdomen:  Soft, nondistended, nontender  Extremities: No peripheral edema  Neurologic: Alert, oriented  Skin: No acute rashes          Basic Metabolic Panel:   Recent Labs Lab 09/02/15 1205 09/03/15 0520 09/03/15 1244  09/04/15 0520 09/04/15 0842 09/04/15 1205 09/04/15 1600 09/04/15 2200 09/05/15 0500  NA 111* 112* 110*  < > 115* 115* 117* 117* 121* 122*  K 4.0 3.2* 3.0*  --  3.3*  --   --   --   --  4.1  CL 80* 81* 81*  --  88*  --   --   --   --  99*  CO2 23 22 20*  --  20*  --   --   --   --  20*  GLUCOSE 117* 110* 108*  --  99  --   --   --   --  82  BUN 8 <5* 6  --  7  --   --   --   --  7  CREATININE 0.45 0.31* <0.30*  --  0.32*  --   --   --   --  0.42*  CALCIUM 7.9* 7.6* 7.3*  --  7.1*  --   --   --   --  7.0*  MG  --   --  1.4*  --  2.5*  --   --   --   --   --   < > = values in this interval not displayed.   CBC:  Recent Labs Lab 09/02/15 1205 09/03/15 0520  WBC 4.6 5.1  HGB 9.8*  10.5*  HCT 27.5* 29.6*  MCV 87.5 88.4  PLT 269 264      Microbiology:  Recent Results (from the past 720 hour(s))  Urine culture     Status: Abnormal   Collection Time: 08/08/15  9:30 AM  Result Value Ref Range Status   Specimen Description URINE, CLEAN CATCH  Final   Special Requests NONE  Final   Culture >=100,000 COLONIES/mL ESCHERICHIA COLI (A)  Final   Report Status 08/10/2015 FINAL  Final   Organism ID, Bacteria ESCHERICHIA COLI (A)  Final  Susceptibility   Escherichia coli - MIC*    AMPICILLIN 8 SENSITIVE Sensitive     CEFAZOLIN <=4 SENSITIVE Sensitive     CEFTRIAXONE <=1 SENSITIVE Sensitive     CIPROFLOXACIN >=4 RESISTANT Resistant     GENTAMICIN <=1 SENSITIVE Sensitive     IMIPENEM <=0.25 SENSITIVE Sensitive     NITROFURANTOIN <=16 SENSITIVE Sensitive     TRIMETH/SULFA <=20 SENSITIVE Sensitive     AMPICILLIN/SULBACTAM 4 SENSITIVE Sensitive     PIP/TAZO <=4 SENSITIVE Sensitive     Extended ESBL NEGATIVE Sensitive     * >=100,000 COLONIES/mL ESCHERICHIA COLI  Urine culture     Status: Abnormal   Collection Time: 08/19/15  1:04 PM  Result Value Ref Range Status   Specimen Description URINE, CLEAN CATCH  Final   Special Requests NONE  Final   Culture >=100,000 COLONIES/mL ESCHERICHIA COLI (A)  Final   Report Status 08/21/2015 FINAL  Final   Organism ID, Bacteria ESCHERICHIA COLI (A)  Final      Susceptibility   Escherichia coli - MIC*    AMPICILLIN 16 INTERMEDIATE Intermediate     CEFAZOLIN <=4 SENSITIVE Sensitive     CEFTRIAXONE <=1 SENSITIVE Sensitive     CIPROFLOXACIN >=4 RESISTANT Resistant     GENTAMICIN <=1 SENSITIVE Sensitive     IMIPENEM <=0.25 SENSITIVE Sensitive     NITROFURANTOIN <=16 SENSITIVE Sensitive     TRIMETH/SULFA <=20 SENSITIVE Sensitive     AMPICILLIN/SULBACTAM 4 SENSITIVE Sensitive     PIP/TAZO <=4 SENSITIVE Sensitive     Extended ESBL NEGATIVE Sensitive     * >=100,000 COLONIES/mL ESCHERICHIA COLI  Urine culture     Status:  Abnormal   Collection Time: 09/02/15 12:17 PM  Result Value Ref Range Status   Specimen Description URINE, RANDOM  Final   Special Requests NONE  Final   Culture >=100,000 COLONIES/mL PSEUDOMONAS AERUGINOSA (A)  Final   Report Status 09/04/2015 FINAL  Final   Organism ID, Bacteria PSEUDOMONAS AERUGINOSA (A)  Final      Susceptibility   Pseudomonas aeruginosa - MIC*    CEFTAZIDIME 16 INTERMEDIATE Intermediate     CIPROFLOXACIN <=0.25 SENSITIVE Sensitive     GENTAMICIN 2 SENSITIVE Sensitive     IMIPENEM 2 SENSITIVE Sensitive     PIP/TAZO 8 SENSITIVE Sensitive     CEFEPIME 4 INTERMEDIATE Intermediate     * >=100,000 COLONIES/mL PSEUDOMONAS AERUGINOSA  MRSA PCR Screening     Status: None   Collection Time: 09/02/15  4:50 PM  Result Value Ref Range Status   MRSA by PCR NEGATIVE NEGATIVE Final    Comment:        The GeneXpert MRSA Assay (FDA approved for NASAL specimens only), is one component of a comprehensive MRSA colonization surveillance program. It is not intended to diagnose MRSA infection nor to guide or monitor treatment for MRSA infections.   C difficile quick scan w PCR reflex     Status: None   Collection Time: 09/02/15  6:16 PM  Result Value Ref Range Status   C Diff antigen NEGATIVE NEGATIVE Final   C Diff toxin NEGATIVE NEGATIVE Final   C Diff interpretation No C. difficile detected.  Final    Coagulation Studies: No results for input(s): LABPROT, INR in the last 72 hours.  Urinalysis:  Recent Labs  09/02/15 1217  COLORURINE YELLOW*  LABSPEC 1.011  PHURINE 7.0  GLUCOSEU NEGATIVE  HGBUR 1+*  BILIRUBINUR NEGATIVE  KETONESUR NEGATIVE  PROTEINUR NEGATIVE  NITRITE NEGATIVE  LEUKOCYTESUR 3+*      Imaging: Dg Chest 1 View  Result Date: 09/03/2015 CLINICAL DATA:  PICC placement. EXAM: CHEST 1 VIEW COMPARISON:  Chest radiograph 08/23/2015.  Chest CT 07/12/2015 FINDINGS: Tip of the left upper extremity PICC in the mid SVC. No pneumothorax. There is volume  loss in the right lung. Density at the medial right lung base may be lower lobe atelectasis/ collapse versus loculated pleural effusion. Linear opacity in the right upper lobe, which has a similar configuration to prior CT. No focal abnormality in the left lung. Heart is likely normal in size. IMPRESSION: 1. Tip of the left upper extremity PICC in the mid SVC. 2. Volume loss in the right lung with opacity at the medial right lung base. This may be lower lobe atelectasis/collapse versus loculated pleural effusion. 3. Linear right upper lobe opacity appears similar to prior CT in may be sequela of treated lung cancer. Electronically Signed   By: Jeb Levering M.D.   On: 09/03/2015 19:10     Medications:   . sodium chloride (hypertonic) 25 mL/hr (09/05/15 0921)   . acidophilus  2 capsule Oral Daily  . albuterol  2.5 mg Nebulization TID  . aspirin EC  81 mg Oral Daily  . brimonidine  1 drop Both Eyes Q12H   And  . timolol  1 drop Both Eyes Q12H  . ciprofloxacin  400 mg Intravenous Q12H  . colesevelam  625 mg Oral BID WC  . enoxaparin (LOVENOX) injection  40 mg Subcutaneous Q24H  . ezetimibe  10 mg Oral Daily  . feeding supplement (ENSURE ENLIVE)  237 mL Oral BID BM  . glimepiride  2 mg Oral Q breakfast  . insulin aspart  0-9 Units Subcutaneous TID WC  . latanoprost  1 drop Both Eyes QHS  . levothyroxine  50 mcg Oral QAC breakfast  . megestrol  400 mg Oral BID  . metFORMIN  500 mg Oral BID WC  . sodium chloride flush  3 mL Intravenous Q12H  . verapamil  120 mg Oral Daily   acetaminophen **OR** acetaminophen, HYDROcodone-acetaminophen, LORazepam, ondansetron **OR** ondansetron (ZOFRAN) IV  Assessment/ Plan:  82 y.o. female female with medical problems of anemia, metastatic small cell lung cancer, diabetes type 2, hyperlipidemia, hypertension, hypothyroidism, recent pelvic fracture who was admitted to [facility] on 09/02/2015 for evaluation of syncope at nursing home.  1.   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

## 2015-09-05 NOTE — Care Management Important Message (Signed)
Important Message  Patient Details  Name: Madison Fry MRN: [id number] Date of Birth: 1928-03-25   Medicare Important Message Given:  Yes    Katrina Stack, RN 09/05/2015, 9:20 AM

## 2015-09-05 NOTE — Progress Notes (Signed)
[employer] Auth # received. Auth # [id number]. Provided to [facility] Admissions.  Clinical Social Worker was informed that patient will be medically ready to discharge to [facility]. Patient in a agreement with plan. CSW called [place]- Admissions at [facility] to confirm that patient's bed is ready. Provided patient's room number 29 A and number to call for report [phone] . All discharge information faxed to [facility] via [place]. DNR added to discharge packet.   Call to patient's daughter,informed her patient would discharge to [facility]. She reported she'll transport patient. RN will call report and patient will discharge to [facility]  via daughter.  Ernest Pine, MSW, LCSW, [place] Clinical Social Worker [phone]

## 2015-09-05 NOTE — Consult Note (Signed)
   [facility] CM Inpatient Consult   09/05/2015  Madison Fry 1928-12-27 [id number]  Patient screened for potential [place] Management services. Patient is eligible for [place]. Electronic medical record reveals patient's discharge plan is SNF with palliative care follow-up. [facility] Care Management services not appropriate at this time. If patient's post hospital needs change please place a [facility] Care Management consult. For questions please contact:   Cai Flott RN, [facility] Liaison  [phone]) Business Mobile [phone]) Toll free office

## 2015-09-05 NOTE — Progress Notes (Signed)
Pt is being discharged to [facility]. Pt's daughter to transfer pt to facility. Discharge packet and MOST form given to pt's daughter. AVS given and explained to pt and pt's daughter. Both verbalized understanding. Report given to [place], LPN.

## 2015-09-05 NOTE — Clinical Social Work Placement (Signed)
   CLINICAL SOCIAL WORK PLACEMENT  NOTE  Date:  09/05/2015  Patient Details  Name: Madison Fry MRN: [id number] Date of Birth: [birth year]  Clinical Social Work is seeking post-discharge placement for this patient at the [place] level of care (*CSW will initial, date and re-position this form in  chart as items are completed):  Yes   Patient/family provided with [place] Work Department's list of facilities offering this level of care within the geographic area requested by the patient (or if unable, by the patient's family).  Yes   Patient/family informed of their freedom to choose among providers that offer the needed level of care, that participate in Medicare, Medicaid or managed care program needed by the patient, have an available bed and are willing to accept the patient.  Yes   Patient/family informed of [facility]'s ownership interest in [facility] and [facility], as well as of the fact that they are under no obligation to receive care at these facilities.  PASRR submitted to EDS on       PASRR number received on       Existing PASRR number confirmed on 09/04/15     FL2 transmitted to all facilities in geographic area requested by pt/family on 09/04/15     FL2 transmitted to all facilities within larger geographic area on       Patient informed that his/her managed care company has contracts with or will negotiate with certain facilities, including the following:        Yes   Patient/family informed of bed offers received.  Patient chooses bed at  [facility])     Physician recommends and patient chooses bed at      Patient to be transferred to  [facility] ) on 09/05/15.  Patient to be transferred to facility by  (Daughter)     Patient family notified on 09/05/15 of transfer.  Name of family member notified:   (daughter)     PHYSICIAN       Additional Comment:     _______________________________________________ Baldemar Lenis, LCSW 09/05/2015, 11:10 AM

## 2015-09-05 NOTE — Progress Notes (Signed)
New referral for Palliative Medicine consult at [facility] following discharge received from [place] NP Wadie Lessen. Patient information faxed to [place] referral. Thank you. Flo Shanks Rn, BSN, [place] and [facility] of [place], [facility] [phone] c

## 2015-09-05 NOTE — Progress Notes (Signed)
PT Cancellation Note  Patient Details Name: Madison Fry MRN: [id number] DOB: December 15, 1928   Cancelled Treatment:    Reason Eval/Treat Not Completed: Patient declined, no reason specified   Pt offered session this morning.  Declined session "Come back tomorrow".  D/C planned this afternoon to SNF/Rehab facility.   Chesley Noon 09/05/2015, 9:25 AM

## 2015-09-05 NOTE — Progress Notes (Signed)
CSW contacted Amy- [employer] [facility] CM and informed her that patient will discharge today. She has began [place] and will inform CSW when patient has been approved.  Ernest Pine, MSW, LCSW, [place] Clinical Social Worker [phone]

## 2015-09-05 NOTE — Care Management (Signed)
Updated attending on discharge disposition discussion regarding skilled nursing auth required by insurance company to return and benefit of palliative care to follow in facility.  She does not have medicaid for long term care at present

## 2015-09-05 NOTE — Discharge Summary (Signed)
[facility] NAME: Madison Fry    MR#:  [id number]  DATE OF BIRTH:  October 27, 1928  DATE OF ADMISSION:  09/02/2015 ADMITTING PHYSICIAN: Dustin Flock, MD  DATE OF DISCHARGE: No discharge date for patient encounter.  PRIMARY CARE PHYSICIAN: SHAW,KIMBERLEE, MD     ADMISSION DIAGNOSIS:  Syncope and collapse [R55] Hyponatremia [E87.1] UTI (lower urinary tract infection) [N39.0]  DISCHARGE DIAGNOSIS:  Active Problems:   Hyponatremia   SIADH (syndrome of inappropriate ADH production) (HCC)   Metastatic lung cancer (metastasis from lung to other site) (HCC)   Encephalopathy, metabolic   Hypotension   Pseudomonas infection   UTI (urinary tract infection)   Pressure ulcer   DNR (do not resuscitate)   Palliative care by specialist   Hypokalemia   SECONDARY DIAGNOSIS:   Past Medical History:  Diagnosis Date  . Anemia   . Breast cancer ([place])   . Cancer [facility])    breast cancer, Right side lymph nodes removed  . Diabetes mellitus without complication ([place])   . Glaucoma   . Hyperlipidemia   . Hyperlipidemia   . Hypertension   . Lung cancer ([place])   . Thyroid disease     .pro HOSPITAL COURSE:  The patient is a 82 year old Caucasian female with past history significant for history of anemia, lung cancer with metastasis to bone, diabetes mellitus, hyperlipidemia, hypertension, hypothyroidism, who was sent to the hospitalist for urinary tract infection and discharged to rehabilitation facility comes back with weakness and episode of syncope, inability to eat or drink, diarrhea, altered mental status/drowsiness. On arrival to the hospital, she was noted to have sodium level of 111 and was admitted for IV fluid administration, Unfortunately, patient's sodium level did not improve on normal saline administration, but worsened, concerning for SIADH. Urine osmolality was high in comparison to serum. The patient was initiated on 3%  sodium solution and improved clinically today. Her sodium level also has improved to 122 over the past 24 hours. She is eating better and complains of no pain. She was seen by palliative care and recommended palliative care follow up in the facility. She was felt to be stable to be discharged to facility today. Discussion by problem: #1. Severe hyponatremia, SIADH, Improved on 3% sodium chloride solution, the patient will receive 3% sodium chloride solution via PICC line until the moment of discharge. Sodium level will be checked just prior to discharge. Would recommend 1200 cc fluid restriction. It is recommended to follow sodium level as outpatient. She may benefit from nephrology or endocrinology follow-up as outpatient for SIADH treatment. Patient would benefit from palliative care follow up as outpatient as well.  #2 metabolic encephalopathy due to severe hyponatremia, resolved #3 hypotension,  likely due to malnutrition, sepsis , improved with 3% sodium solution, sepsis therapy.  #4. Hypokalemia, resolved. Magnesium level was found to be in normal . #5. Sepsis due to Urinary tract infection, urine cultures revealed Pseudomonas aeruginosa, continue ciprofloxacin for 5 more days to complete course. #7. Generalized weakness, patient will be discharged to skilled nursing facility for continuation of physical therapy.  DISCHARGE CONDITIONS:   Stable  CONSULTS OBTAINED:  Treatment Team:  Murlean Iba, MD  DRUG ALLERGIES:   Allergies  Allergen Reactions  . Shellfish Allergy Swelling    DISCHARGE MEDICATIONS:   Current Discharge Medication List    START taking these medications   Details  acidophilus (RISAQUAD) CAPS capsule Take 2 capsules by mouth daily. Qty: 30 capsule,  Refills: 5    ciprofloxacin (CIPRO) 250 MG tablet Take 1 tablet (250 mg total) by mouth 2 (two) times daily. Qty: 12 tablet, Refills: 0    feeding supplement, ENSURE ENLIVE, (ENSURE ENLIVE) LIQD Take 237 mLs by  mouth 2 (two) times daily between meals. Qty: 237 mL, Refills: 12    megestrol (MEGACE) 400 MG/10ML suspension Take 10 mLs (400 mg total) by mouth 2 (two) times daily. Qty: 240 mL, Refills: 0      CONTINUE these medications which have CHANGED   Details  HYDROcodone-acetaminophen (NORCO/VICODIN) 5-325 MG tablet Take 1 tablet by mouth every 6 (six) hours as needed for severe pain. Qty: 20 tablet, Refills: 0      CONTINUE these medications which have NOT CHANGED   Details  albuterol (PROVENTIL) (2.5 MG/3ML) 0.083% nebulizer solution Take 3 mLs (2.5 mg total) by nebulization 3 (three) times daily. Qty: 252 mL, Refills: 3    aspirin EC 81 MG tablet Take 81 mg by mouth daily.    colesevelam (WELCHOL) 625 MG tablet Take by mouth 2 (two) times daily with a meal.    COMBIGAN 0.2-0.5 % ophthalmic solution PUT 1 DROP IN AFFECTED EYE EVERY 12 HOURS**NEED OFFICE VISIT** Refills: 0    ezetimibe (ZETIA) 10 MG tablet Take 10 mg by mouth daily.    glimepiride (AMARYL) 2 MG tablet Take 2 mg by mouth daily with breakfast.    levothyroxine (SYNTHROID, LEVOTHROID) 50 MCG tablet Take 50 mcg by mouth daily.    LORazepam (ATIVAN) 0.5 MG tablet Take 0.5 mg by mouth every 6 (six) hours as needed for anxiety.    losartan (COZAAR) 100 MG tablet Take 100 mg by mouth daily.    metFORMIN (GLUCOPHAGE-XR) 500 MG 24 hr tablet Take 1 tablet (500 mg total) by mouth 2 (two) times daily.    TRAVATAN Z 0.004 % SOLN ophthalmic solution PUT 1 DROP INTO BOTH EYES AT BEDTIME Refills: 0    verapamil (CALAN-SR) 120 MG CR tablet Take 120 mg by mouth daily.      STOP taking these medications     cephALEXin (KEFLEX) 250 MG capsule          DISCHARGE INSTRUCTIONS:    Patient is to follow-up with primary care physician as outpatient  If you experience worsening of your admission symptoms, develop shortness of breath, life threatening emergency, suicidal or homicidal thoughts you must seek medical attention  immediately by calling 911 or calling your MD immediately  if symptoms less severe.  You Must read complete instructions/literature along with all the possible adverse reactions/side effects for all the Medicines you take and that have been prescribed to you. Take any new Medicines after you have completely understood and accept all the possible adverse reactions/side effects.   Please note  You were cared for by a hospitalist during your hospital stay. If you have any questions about your discharge medications or the care you received while you were in the hospital after you are discharged, you can call the unit and asked to speak with the hospitalist on call if the hospitalist that took care of you is not available. Once you are discharged, your primary care physician will handle any further medical issues. Please note that NO REFILLS for any discharge medications will be authorized once you are discharged, as it is imperative that you return to your primary care physician (or establish a relationship with a primary care physician if you do not have one) for your aftercare needs  so that they can reassess your need for medications and monitor your lab values.    Today   CHIEF COMPLAINT:   Chief Complaint  Patient presents with  . Loss of Consciousness    HISTORY OF PRESENT ILLNESS:  Madison Fry  is a 82 y.o. female with a known history of anemia, lung cancer with metastasis to bone, diabetes mellitus, hyperlipidemia, hypertension, hypothyroidism, who was sent to the hospitalist for urinary tract infection and discharged to rehabilitation facility comes back with weakness and episode of syncope, inability to eat or drink, diarrhea, altered mental status/drowsiness. On arrival to the hospital, she was noted to have sodium level of 111 and was admitted for IV fluid administration, Unfortunately, patient's sodium level did not improve on normal saline administration, but worsened, concerning for  SIADH. Urine osmolality was high in comparison to serum. The patient was initiated on 3% sodium solution and improved clinically today. Her sodium level also has improved to 122 over the past 24 hours. She is eating better and complains of no pain. She was seen by palliative care and recommended palliative care follow up in the facility. She was felt to be stable to be discharged to facility today. Discussion by problem: #1. Severe hyponatremia, SIADH, Improved on 3% sodium chloride solution, the patient will receive 3% sodium chloride solution via PICC line until the moment of discharge. Sodium level will be checked just prior to discharge. Would recommend 1200 cc fluid restriction. It is recommended to follow sodium level as outpatient. She may benefit from nephrology or endocrinology follow-up as outpatient for SIADH treatment. Patient would benefit from palliative care follow up as outpatient as well.  #2 metabolic encephalopathy due to severe hyponatremia, resolved #3 hypotension,  likely due to malnutrition, sepsis , improved with 3% sodium solution, sepsis therapy.  #4. Hypokalemia, resolved. Magnesium level was found to be in normal . #5. Sepsis due to Urinary tract infection, urine cultures revealed Pseudomonas aeruginosa, continue ciprofloxacin for 5 more days to complete course. #7. Generalized weakness, patient will be discharged to skilled nursing facility for continuation of physical therapy.   VITAL SIGNS:  Blood pressure (!) 130/53, pulse (!) 109, temperature 99.3 F (37.4 C), temperature source Oral, resp. rate 20, height '5\' 4"'$  (1.626 m), weight 68.8 kg (151 lb 11.2 oz), SpO2 100 %.  I/O:   Intake/Output Summary (Last 24 hours) at 09/05/15 0948 Last data filed at 09/05/15 0900  Gross per 24 hour  Intake             1810 ml  Output                0 ml  Net             1810 ml    PHYSICAL EXAMINATION:  GENERAL:  82 y.o.-year-old patient lying in the bed with no acute distress.   EYES: Pupils equal, round, reactive to light and accommodation. No scleral icterus. Extraocular muscles intact.  HEENT: Head atraumatic, normocephalic. Oropharynx and nasopharynx clear.  NECK:  Supple, no jugular venous distention. No thyroid enlargement, no tenderness.  LUNGS: Normal breath sounds bilaterally, no wheezing, rales,rhonchi or crepitation. No use of accessory muscles of respiration.  CARDIOVASCULAR: S1, S2 normal. No murmurs, rubs, or gallops.  ABDOMEN: Soft, non-tender, non-distended. Bowel sounds present. No organomegaly or mass.  EXTREMITIES: No pedal edema, cyanosis, or clubbing.  NEUROLOGIC: Cranial nerves II through XII are intact. Muscle strength 5/5 in all extremities. Sensation intact. Gait not checked.  PSYCHIATRIC:  The patient is alert and oriented x 3.  SKIN: No obvious rash, lesion, or ulcer.   DATA REVIEW:   CBC  Recent Labs Lab 09/03/15 0520  WBC 5.1  HGB 10.5*  HCT 29.6*  PLT 264    Chemistries   Recent Labs Lab 09/04/15 0520  09/05/15 0500  NA 115*  < > 122*  K 3.3*  --  4.1  CL 88*  --  99*  CO2 20*  --  20*  GLUCOSE 99  --  82  BUN 7  --  7  CREATININE 0.32*  --  0.42*  CALCIUM 7.1*  --  7.0*  MG 2.5*  --   --   < > = values in this interval not displayed.  Cardiac Enzymes No results for input(s): TROPONINI in the last 168 hours.  Microbiology Results  Results for orders placed or performed during the hospital encounter of 09/02/15  Urine culture     Status: Abnormal   Collection Time: 09/02/15 12:17 PM  Result Value Ref Range Status   Specimen Description URINE, RANDOM  Final   Special Requests NONE  Final   Culture >=100,000 COLONIES/mL PSEUDOMONAS AERUGINOSA (A)  Final   Report Status 09/04/2015 FINAL  Final   Organism ID, Bacteria PSEUDOMONAS AERUGINOSA (A)  Final      Susceptibility   Pseudomonas aeruginosa - MIC*    CEFTAZIDIME 16 INTERMEDIATE Intermediate     CIPROFLOXACIN <=0.25 SENSITIVE Sensitive     GENTAMICIN 2  SENSITIVE Sensitive     IMIPENEM 2 SENSITIVE Sensitive     PIP/TAZO 8 SENSITIVE Sensitive     CEFEPIME 4 INTERMEDIATE Intermediate     * >=100,000 COLONIES/mL PSEUDOMONAS AERUGINOSA  MRSA PCR Screening     Status: None   Collection Time: 09/02/15  4:50 PM  Result Value Ref Range Status   MRSA by PCR NEGATIVE NEGATIVE Final    Comment:        The GeneXpert MRSA Assay (FDA approved for NASAL specimens only), is one component of a comprehensive MRSA colonization surveillance program. It is not intended to diagnose MRSA infection nor to guide or monitor treatment for MRSA infections.   C difficile quick scan w PCR reflex     Status: None   Collection Time: 09/02/15  6:16 PM  Result Value Ref Range Status   C Diff antigen NEGATIVE NEGATIVE Final   C Diff toxin NEGATIVE NEGATIVE Final   C Diff interpretation No C. difficile detected.  Final    RADIOLOGY:  Dg Chest 1 View  Result Date: 09/03/2015 CLINICAL DATA:  PICC placement. EXAM: CHEST 1 VIEW COMPARISON:  Chest radiograph 08/23/2015.  Chest CT 07/12/2015 FINDINGS: Tip of the left upper extremity PICC in the mid SVC. No pneumothorax. There is volume loss in the right lung. Density at the medial right lung base may be lower lobe atelectasis/ collapse versus loculated pleural effusion. Linear opacity in the right upper lobe, which has a similar configuration to prior CT. No focal abnormality in the left lung. Heart is likely normal in size. IMPRESSION: 1. Tip of the left upper extremity PICC in the mid SVC. 2. Volume loss in the right lung with opacity at the medial right lung base. This may be lower lobe atelectasis/collapse versus loculated pleural effusion. 3. Linear right upper lobe opacity appears similar to prior CT in may be sequela of treated lung cancer. Electronically Signed   By: Jeb Levering M.D.   On: 09/03/2015 19:10  EKG:   Orders placed or performed during the hospital encounter of 09/02/15  . ED EKG  . ED EKG       Management plans discussed with the patient, family and they are in agreement.  CODE STATUS:     Code Status Orders        Start     Ordered   09/02/15 1336  Do not attempt resuscitation (DNR)  Continuous    Question Answer Comment  In the event of cardiac or respiratory ARREST Do not call a "code blue"   In the event of cardiac or respiratory ARREST Do not perform Intubation, CPR, defibrillation or ACLS   In the event of cardiac or respiratory ARREST Use medication by any route, position, wound care, and other measures to relive pain and suffering. May use oxygen, suction and manual treatment of airway obstruction as needed for comfort.      09/02/15 1335    Code Status History    Date Active Date Inactive Code Status Order ID Comments User Context   09/02/2015  1:35 PM 09/02/2015  1:35 PM Full Code [id number]  Dustin Flock, MD ED   08/23/2015  8:22 PM 08/26/2015  9:25 PM DNR [id number]  Henreitta Leber, MD Inpatient   04/10/2015  9:47 PM 04/12/2015  4:42 PM Full Code [id number]  Vianne Bulls, MD ED   08/17/2014 12:22 PM 08/20/2014  3:56 PM Full Code [id number]  Nicholes Mango, MD Inpatient    Advance Directive Documentation   Flowsheet Row Most Recent Value  Type of Advance Directive  Healthcare Power of Attorney  Pre-existing out of facility DNR order (yellow form or pink MOST form)  No data  "MOST" Form in Place?  No data      TOTAL TIME TAKING CARE OF THIS PATIENT: 40 minutes.    Theodoro Grist M.D on 09/05/2015 at 9:48 AM  Between 7am to 6pm - Pager - [phone]  After 6pm go to [email or web address] - password [email or web address]  Office  [phone]  CC: Primary care physician; Mayra Neer, MD

## 2015-09-09 ENCOUNTER — Inpatient Hospital Stay: Payer: Commercial Managed Care - HMO

## 2015-09-09 ENCOUNTER — Inpatient Hospital Stay: Payer: Commercial Managed Care - HMO | Admitting: Oncology

## 2015-09-23 ENCOUNTER — Inpatient Hospital Stay: Payer: Commercial Managed Care - HMO

## 2015-09-23 ENCOUNTER — Inpatient Hospital Stay: Payer: Commercial Managed Care - HMO | Admitting: Oncology

## 2015-10-02 ENCOUNTER — Emergency Department: Payer: Commercial Managed Care - HMO

## 2015-10-02 ENCOUNTER — Emergency Department
Admission: EM | Admit: 2015-10-02 | Discharge: 2015-10-02 | Disposition: A | Discharge status: Home / Self Care | Payer: Commercial Managed Care - HMO | Attending: Student in an Organized Health Care Education/Training Program | Admitting: Student in an Organized Health Care Education/Training Program

## 2015-10-02 DIAGNOSIS — Z853 Personal history of malignant neoplasm of breast: Secondary | ICD-10-CM | POA: Diagnosis not present

## 2015-10-02 DIAGNOSIS — Z7982 Long term (current) use of aspirin: Secondary | ICD-10-CM | POA: Insufficient documentation

## 2015-10-02 DIAGNOSIS — E860 Dehydration: Secondary | ICD-10-CM | POA: Diagnosis not present

## 2015-10-02 DIAGNOSIS — Z87891 Personal history of nicotine dependence: Secondary | ICD-10-CM | POA: Insufficient documentation

## 2015-10-02 DIAGNOSIS — E119 Type 2 diabetes mellitus without complications: Secondary | ICD-10-CM | POA: Diagnosis not present

## 2015-10-02 DIAGNOSIS — I10 Essential (primary) hypertension: Secondary | ICD-10-CM | POA: Diagnosis not present

## 2015-10-02 DIAGNOSIS — Z7984 Long term (current) use of oral hypoglycemic drugs: Secondary | ICD-10-CM | POA: Diagnosis not present

## 2015-10-02 DIAGNOSIS — Z515 Encounter for palliative care: Secondary | ICD-10-CM | POA: Insufficient documentation

## 2015-10-02 DIAGNOSIS — R0602 Shortness of breath: Secondary | ICD-10-CM | POA: Diagnosis present

## 2015-10-02 DIAGNOSIS — N390 Urinary tract infection, site not specified: Secondary | ICD-10-CM | POA: Insufficient documentation

## 2015-10-02 DIAGNOSIS — Z79899 Other long term (current) drug therapy: Secondary | ICD-10-CM | POA: Diagnosis not present

## 2015-10-02 DIAGNOSIS — Z85118 Personal history of other malignant neoplasm of bronchus and lung: Secondary | ICD-10-CM | POA: Insufficient documentation

## 2015-10-02 LAB — COMPREHENSIVE METABOLIC PANEL
ALK PHOS: 423 U/L — AB (ref 38–126)
ALT: 11 U/L — AB (ref 14–54)
ANION GAP: 9 (ref 5–15)
AST: 55 U/L — ABNORMAL HIGH (ref 15–41)
Albumin: 2.7 g/dL — ABNORMAL LOW (ref 3.5–5.0)
BUN: 34 mg/dL — ABNORMAL HIGH (ref 6–20)
CALCIUM: 9.7 mg/dL (ref 8.9–10.3)
CO2: 20 mmol/L — ABNORMAL LOW (ref 22–32)
Chloride: 112 mmol/L — ABNORMAL HIGH (ref 101–111)
Creatinine, Ser: 1.54 mg/dL — ABNORMAL HIGH (ref 0.44–1.00)
GFR, EST AFRICAN AMERICAN: 35 mL/min — AB (ref >60)
GFR, EST NON AFRICAN AMERICAN: 30 mL/min — AB (ref >60)
Glucose, Bld: 44 mg/dL — CL (ref 65–99)
Potassium: 2.9 mmol/L — ABNORMAL LOW (ref 3.5–5.1)
SODIUM: 141 mmol/L (ref 135–145)
TOTAL PROTEIN: 6.3 g/dL — AB (ref 6.5–8.1)
Total Bilirubin: 2.6 mg/dL — ABNORMAL HIGH (ref 0.3–1.2)

## 2015-10-02 LAB — CBC WITH DIFFERENTIAL/PLATELET
Basophils Absolute: 0.2 10*3/uL — ABNORMAL HIGH (ref 0–0.1)
Basophils Relative: 1 %
EOS ABS: 0.1 10*3/uL (ref 0–0.7)
EOS PCT: 1 %
HCT: 25.4 % — ABNORMAL LOW (ref 35.0–47.0)
HEMOGLOBIN: 8.6 g/dL — AB (ref 12.0–16.0)
LYMPHS ABS: 0.6 10*3/uL — AB (ref 1.0–3.6)
LYMPHS PCT: 4 %
MCH: 32.4 pg (ref 26.0–34.0)
MCHC: 34 g/dL (ref 32.0–36.0)
MCV: 95.2 fL (ref 80.0–100.0)
MONOS PCT: 8 %
Monocytes Absolute: 1.4 10*3/uL — ABNORMAL HIGH (ref 0.2–0.9)
NEUTROS PCT: 86 %
Neutro Abs: 14.8 10*3/uL — ABNORMAL HIGH (ref 1.4–6.5)
Platelets: 123 10*3/uL — ABNORMAL LOW (ref 150–440)
RBC: 2.67 MIL/uL — ABNORMAL LOW (ref 3.80–5.20)
RDW: 20.8 % — ABNORMAL HIGH (ref 11.5–14.5)
WBC: 17.1 10*3/uL — AB (ref 3.6–11.0)

## 2015-10-02 LAB — TROPONIN I: TROPONIN I: 0.04 ng/mL — AB (ref <0.03)

## 2015-10-02 MED ORDER — SODIUM CHLORIDE 0.9 % IV SOLN
Freq: Once | INTRAVENOUS | Status: AC
  Administered 2015-10-02: 13:00:00 via INTRAVENOUS

## 2015-10-02 MED ORDER — DEXTROSE 5 % IV SOLN
1.0000 g | Freq: Once | INTRAVENOUS | Status: AC
  Administered 2015-10-02: 1 g via INTRAVENOUS
  Filled 2015-10-02: qty 10

## 2015-10-02 MED ORDER — DEXTROSE 50 % IV SOLN
25.0000 g | Freq: Once | INTRAVENOUS | Status: AC
  Administered 2015-10-02: 25 g via INTRAVENOUS
  Filled 2015-10-02: qty 50

## 2015-10-02 NOTE — ED Triage Notes (Signed)
Pt arrives via [facility] from [facility].  EMS reports being called there for low blood pressure.  Per report pt being treated for UTI.  Has lung CA per report.

## 2015-10-02 NOTE — ED Provider Notes (Signed)
[facility] Emergency Department Provider Note        Time seen: ----------------------------------------- 11:57 AM on 10/02/2015 ----------------------------------------- Level V caveat: Review of systems and history is limited by altered mental status   I have reviewed the triage vital signs and the nursing notes.   HISTORY  Chief Complaint Shortness of Breath    HPI Madison Fry is a 82 y.o. female who presents the ER being brought from [facility] with low blood pressure. Family was concerned that she was dehydrated or that she had worsening UTI. Patient is currently being treated for lung cancer with metastases to the brain. Family is not concerned about doing brain imaging at this time just treating UTI and dehydration.   Past Medical History:  Diagnosis Date  . Anemia   . Breast cancer ([place])   . Cancer [facility])    breast cancer, Right side lymph nodes removed  . Diabetes mellitus without complication ([place])   . Glaucoma   . Hyperlipidemia   . Hyperlipidemia   . Hypertension   . Lung cancer ([place])   . Thyroid disease     Patient Active Problem List   Diagnosis Date Noted  . SIADH (syndrome of inappropriate ADH production) ([place]) 09/05/2015  . Encephalopathy, metabolic 95/09/3265  . Hypotension 09/05/2015  . Hypokalemia 09/05/2015  . Pseudomonas infection 09/05/2015  . UTI (urinary tract infection) 09/05/2015  . DNR (do not resuscitate) 09/04/2015  . Palliative care by specialist 09/04/2015  . Metastatic lung cancer (metastasis from lung to other site) [facility])   . Pressure ulcer 09/03/2015  . Malnutrition of moderate degree 08/25/2015  . Urinary tract infection 08/23/2015  . Metastatic small cell carcinoma to bone ([place]) 04/29/2015  . Dyspnea 04/10/2015  . Acute anxiety 04/10/2015  . Leukocytosis 04/10/2015  . Hyponatremia 04/10/2015  . Type 2 diabetes mellitus ([place]) 04/10/2015  . UTI (lower urinary tract infection) 08/19/2014   . Hip pain, acute 08/17/2014  . Elevated troponin 08/17/2014    Past Surgical History:  Procedure Laterality Date  . VIDEO BRONCHOSCOPY Bilateral 04/12/2015   Procedure: VIDEO BRONCHOSCOPY WITH FLUORO;  Surgeon: Juanito Doom, MD;  Location: [facility] ENDOSCOPY;  Service: Cardiopulmonary;  Laterality: Bilateral;    Allergies Shellfish allergy  Social History Social History  Substance Use Topics  . Smoking status: Former Smoker    Packs/day: 1.50    Years: 40.00    Types: Cigarettes    Quit date: 08/27/1990  . Smokeless tobacco: Never Used  . Alcohol use No    Review of Systems Unknown at this time  ____________________________________________   PHYSICAL EXAM:  VITAL SIGNS: ED Triage Vitals  Enc Vitals Group     BP 10/02/15 1126 (!) 116/52     Pulse Rate 10/02/15 1126 97     Resp 10/02/15 1126 (!) 26     Temp 10/02/15 1126 98.3 F (36.8 C)     Temp Source 10/02/15 1126 Oral     SpO2 10/02/15 1126 98 %     Weight 10/02/15 1131 162 lb 6.4 oz (73.7 kg)     Height 10/02/15 1131 '5\' 5"'$  (1.651 m)     Head Circumference --      Peak Flow --      Pain Score --      Pain Loc --      Pain Edu? --      Excl. in [place]? --     Constitutional: Alert But disoriented, chronically ill appearing. No acute distress.  Eyes: Conjunctivae are Pale. Normal extraocular movements. ENT   Head: Normocephalic and atraumatic.   Nose: No congestion/rhinnorhea.   Mouth/Throat: Mucous membranes are moist.   Neck: No stridor. Cardiovascular: Normal rate, regular rhythm. No murmurs, rubs, or gallops. Respiratory: Normal respiratory effort without tachypnea nor retractions. Breath sounds are clear and equal bilaterally. No wheezes/rales/rhonchi. Gastrointestinal: Soft and nontender. Normal bowel sounds Musculoskeletal : Mild edema, no tenderness in the extremities neurologic:  Normal speech and language. generalized weakness, nothing focal Skin:  Skin is warm, dry and intact. No rash  noted. ____________________________________________  EKG: Interpreted by me reveals atrial fibrillation with a rate of 85 bpm, normal QRS, normal QT interval. Leftward axis. Flat T waves.  __________________________________________  ED COURSE:  Pertinent labs & imaging results that were available during my care of the patient were reviewed by me and considered in my medical decision making (see chart for details). Clinical Course   patient presents to ER with weakness, we will assess basic labs and give fluid.   Procedures ____________________________________________   LABS (pertinent positives/negatives)  Labs Reviewed  CBC WITH DIFFERENTIAL/PLATELET - Abnormal; Notable for the following:       Result Value   WBC 17.1 (*)    RBC 2.67 (*)    Hemoglobin 8.6 (*)    HCT 25.4 (*)    RDW 20.8 (*)    Platelets 123 (*)    Neutro Abs 14.8 (*)    Lymphs Abs 0.6 (*)    Monocytes Absolute 1.4 (*)    Basophils Absolute 0.2 (*)    All other components within normal limits  COMPREHENSIVE METABOLIC PANEL - Abnormal; Notable for the following:    Potassium 2.9 (*)    Chloride 112 (*)    CO2 20 (*)    Glucose, Bld 44 (*)    BUN 34 (*)    Creatinine, Ser 1.54 (*)    Total Protein 6.3 (*)    Albumin 2.7 (*)    AST 55 (*)    ALT 11 (*)    Alkaline Phosphatase 423 (*)    Total Bilirubin 2.6 (*)    GFR calc non Af Amer 30 (*)    GFR calc Af Amer 35 (*)    All other components within normal limits  TROPONIN I - Abnormal; Notable for the following:    Troponin I 0.04 (*)    All other components within normal limits  URINALYSIS COMPLETEWITH MICROSCOPIC ([facility] ONLY)    RADIOLOGY Images were viewed by me  Chest x-ray IMPRESSION: Stable right hilar prominence and nodular scarring in right upper lobe. No definite superimposed segmental infiltrate or pulmonary edema. There is small right pleural effusion with right base posteriorly atelectasis or  infiltrate.  ___________________________________________  FINAL ASSESSMENT AND PLAN:  WEAKNESS, Dehydration, palliative care  Plan: Patient with labs and imaging as dictated above. Patient with terminal cancer on palliative care, family requested IV fluids as well as IV antibiotics to treat the UTI. I have advised that infection and dehydration will come back and they are understanding of this. Family just wants to keep her comfortable. She is stable to return to [facility] care.   Earleen Newport, MD   Note: This dictation was prepared with Dragon dictation. Any transcriptional errors that result from this process are unintentional    Earleen Newport, MD 10/02/15 1346

## 2015-10-17 ENCOUNTER — Encounter: Payer: Self-pay | Admitting: *Deleted

## 2015-10-27 DEATH — deceased

## 2015-11-12 LAB — MISC LABCORP TEST (SEND OUT): Labcorp test code: 1453

## 2015-11-21 ENCOUNTER — Ambulatory Visit: Payer: Commercial Managed Care - HMO | Admitting: Radiation Oncology

## 2015-12-05 ENCOUNTER — Ambulatory Visit: Payer: Commercial Managed Care - HMO | Admitting: Radiation Oncology

## 2016-01-11 ENCOUNTER — Other Ambulatory Visit: Payer: Self-pay | Admitting: Nurse Practitioner

## 2017-03-06 IMAGING — CT CT CHEST W/ CM
2 of 3 series · 15 of 36 positions shown, 18 images · IV contrast (OMNIPAQUE 300)
Comparison: Chest x-ray from earlier today.

CLINICAL DATA: Shortness of Breath.  Worsening chest x-ray.

EXAM:
CT CHEST WITH CONTRAST
TECHNIQUE: Multidetector CT imaging of the chest was performed during
intravenous contrast administration.
CONTRAST:  80mL OMNIPAQUE IOHEXOL 300 MG/ML  SOLN

[Series 2: chest with st · axial · 0.68mm/px · z∈[+1355,+1570]mm · 12 of 51 slices shown, 15 images]
[im 4/51  mediastinal]
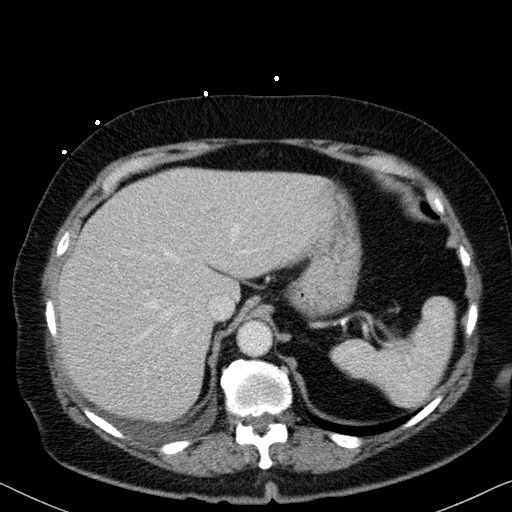
[im 4/51  lung]
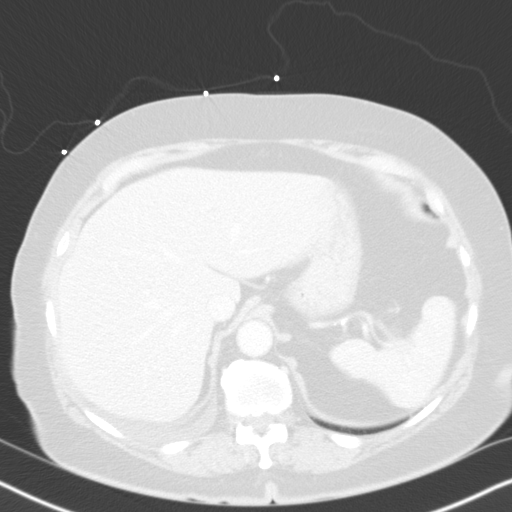
[im 8/51  lung]
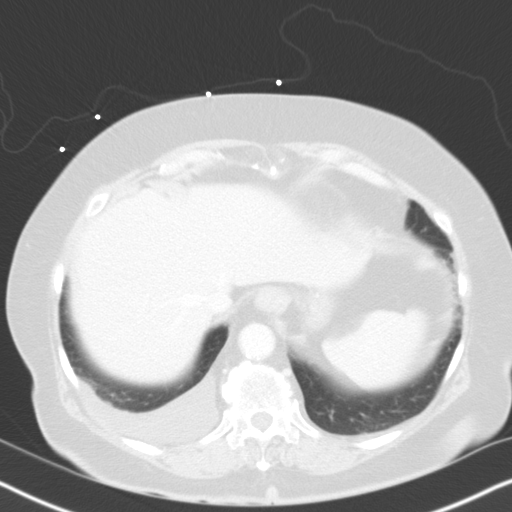
[im 12/51  lung]
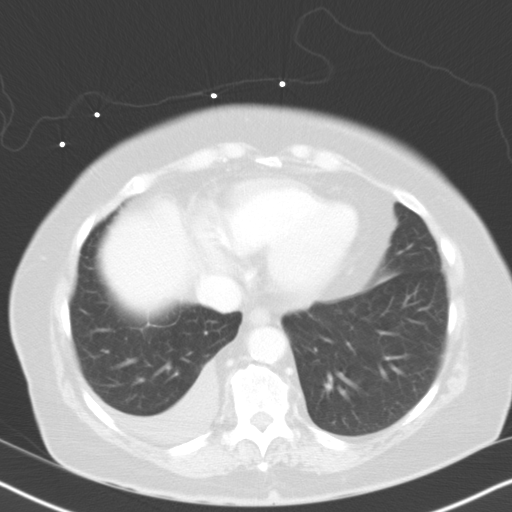
[im 15/51  lung]
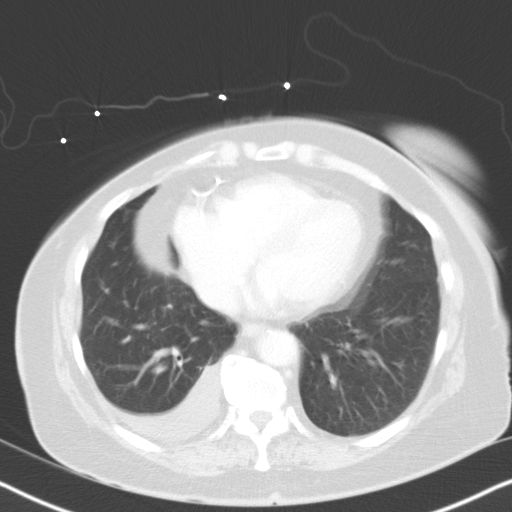
[im 19/51  mediastinal]
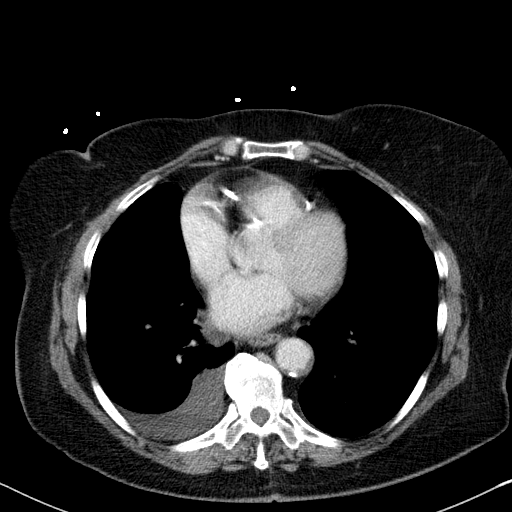
[im 19/51  lung]
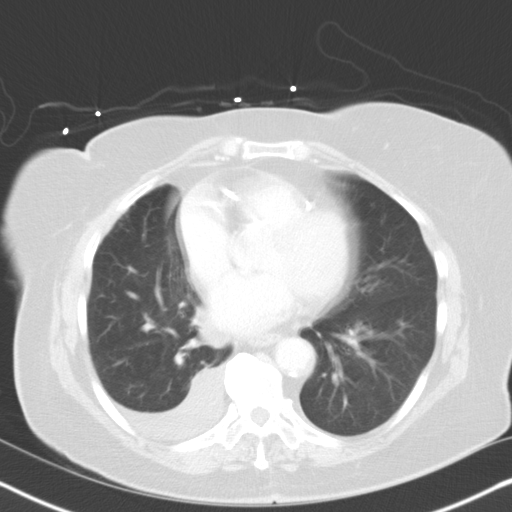
[im 23/51  lung]
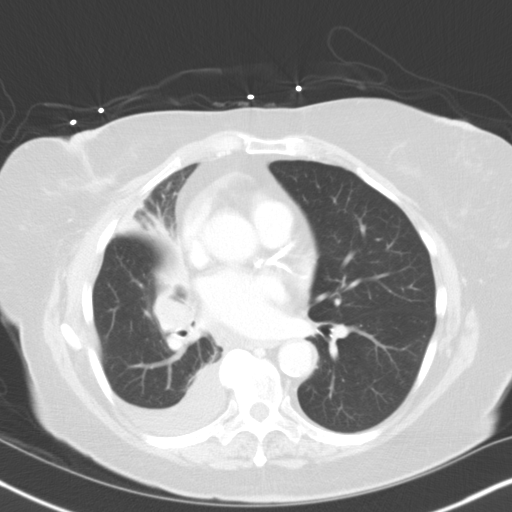
[im 28/51  lung]
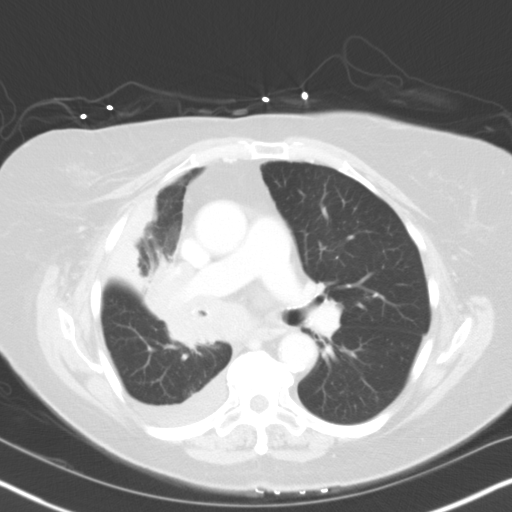
[im 32/51  lung]
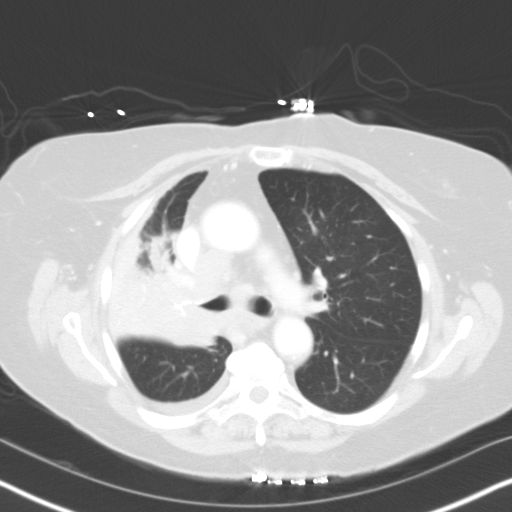
[im 36/51  mediastinal]
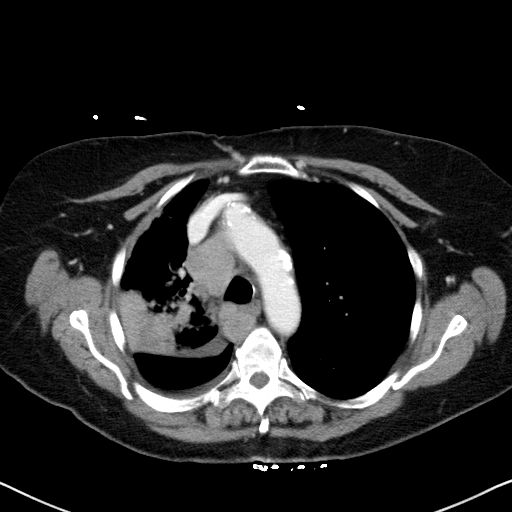
[im 36/51  lung]
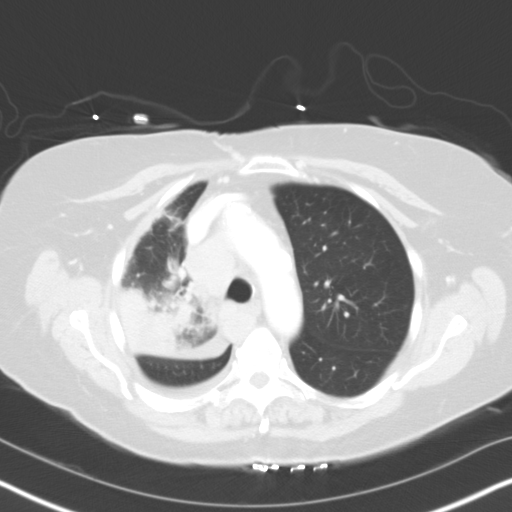
[im 39/51  lung]
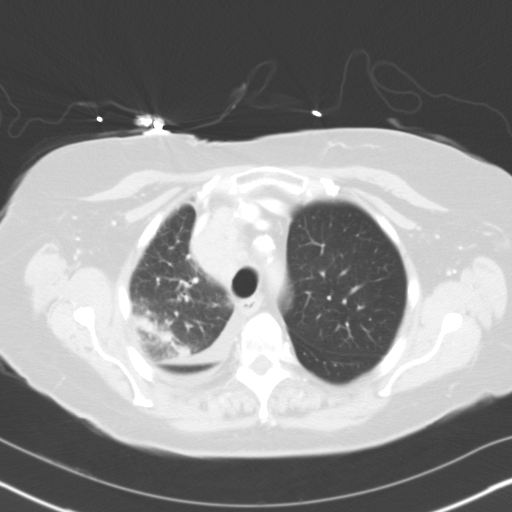
[im 43/51  lung]
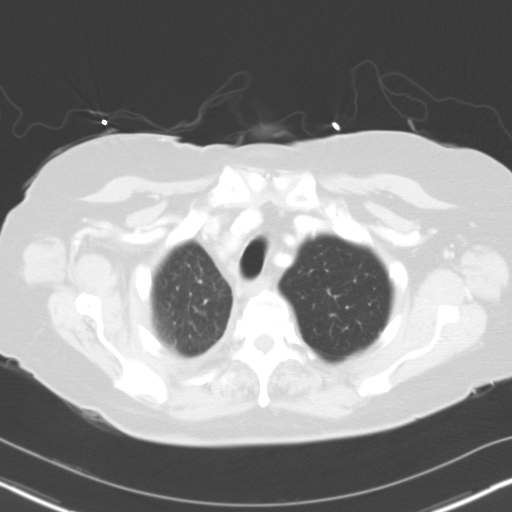
[im 47/51  lung]
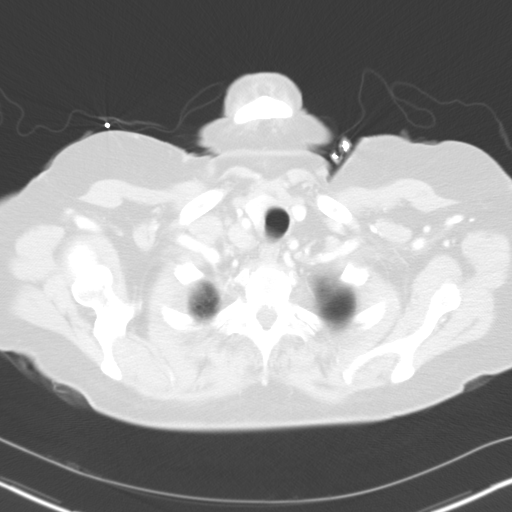

[Series 3: coronals · coronal · 0.53mm/px · 3 of 80 slices shown]
[im 16/80  lung]
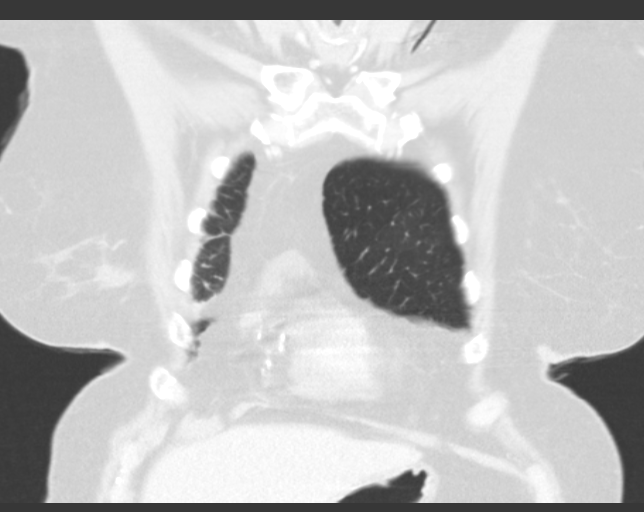
[im 32/80  lung]
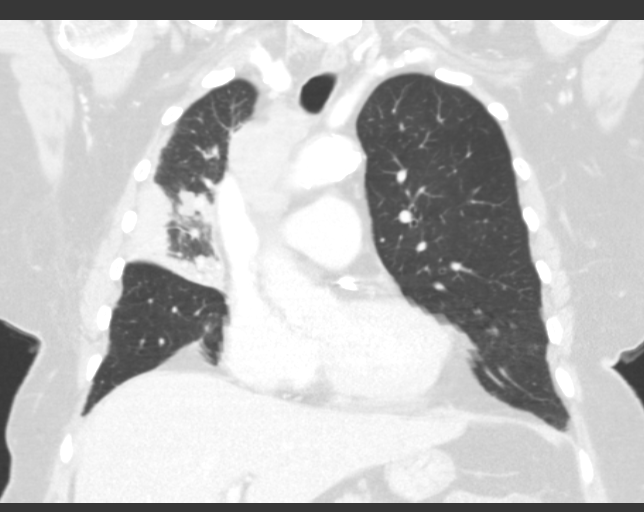
[im 48/80  lung]
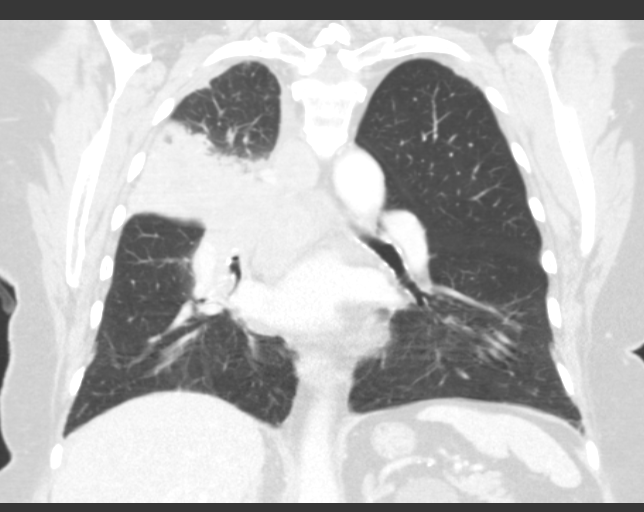

[15 of 36 positions shown; findings below may reference images not displayed]

FINDINGS: Mediastinum / Lymph Nodes: There is no axillary lymphadenopathy.
x 5.4 cm nodal conglomeration is identified in the right
paratracheal space. 8.0 x 4.3 cm mass centered in the region of the
right hilum circumferentially encases the right mainstem bronchus
and markedly attenuates the bronchus intermedius. The right upper
lobe bronchus is occluded. This abnormal soft tissue
circumferentially encases the right upper lobe and right middle lobe
pulmonary arteries. Enter lobar pulmonary artery is
circumferentially encased and attenuated. 17 mm short axis
subcarinal lymph node seen on image 26 series 2. 18 mm
paraesophageal short axis lymph node seen on image 16 of series 2.
No left hilar lymphadenopathy. The heart size is normal. No
pericardial effusion. Coronary artery calcification is noted.

Lungs / Pleura: Volume loss noted in the right upper and middle
lobes. Confluent airspace opacity in the periphery of the right
upper lobe may be a combination of neoplasm and postobstructive
collapse. There is a small right pleural effusion.

Upper Abdomen:  Unremarkable.

[HOSPITAL] / Soft Tissues: Bone windows reveal no worrisome lytic or
sclerotic osseous lesions.
IMPRESSION: 1. Large central right lung/hilar mass extends into the mediastinum,
circumferentially encasing the right mainstem bronchus and pulmonary
arteries to all 3 lobes of the right lung. There is associated
mediastinal lymphadenopathy and probably associated postobstructive
collapse from the central lung lesion.
